# Patient Record
Sex: Female | Born: 1965
Health system: Southern US, Community
[De-identification: ages and names within clinical notes are randomized; demographics above are authoritative.]

## PROBLEM LIST (undated history)

## (undated) ENCOUNTER — Ambulatory Visit (HOSPITAL_COMMUNITY): Admission: EM | Discharge: 2021-06-09 | Discharge status: Absent without leave | Payer: Self-pay

## (undated) DIAGNOSIS — G4700 Insomnia, unspecified: Secondary | ICD-10-CM

## (undated) DIAGNOSIS — E8881 Metabolic syndrome: Secondary | ICD-10-CM

## (undated) DIAGNOSIS — R51 Headache: Secondary | ICD-10-CM

## (undated) DIAGNOSIS — R519 Headache, unspecified: Secondary | ICD-10-CM

## (undated) DIAGNOSIS — H15101 Unspecified episcleritis, right eye: Secondary | ICD-10-CM

## (undated) DIAGNOSIS — T8859XA Other complications of anesthesia, initial encounter: Secondary | ICD-10-CM

## (undated) DIAGNOSIS — F419 Anxiety disorder, unspecified: Secondary | ICD-10-CM

## (undated) DIAGNOSIS — R030 Elevated blood-pressure reading, without diagnosis of hypertension: Secondary | ICD-10-CM

## (undated) DIAGNOSIS — T4145XA Adverse effect of unspecified anesthetic, initial encounter: Secondary | ICD-10-CM

## (undated) DIAGNOSIS — K219 Gastro-esophageal reflux disease without esophagitis: Secondary | ICD-10-CM

## (undated) DIAGNOSIS — I10 Essential (primary) hypertension: Secondary | ICD-10-CM

## (undated) DIAGNOSIS — M1990 Unspecified osteoarthritis, unspecified site: Secondary | ICD-10-CM

## (undated) DIAGNOSIS — IMO0001 Reserved for inherently not codable concepts without codable children: Secondary | ICD-10-CM

## (undated) HISTORY — DX: Elevated blood-pressure reading, without diagnosis of hypertension: R03.0

## (undated) HISTORY — PX: DILATION AND CURETTAGE OF UTERUS: SHX78

## (undated) HISTORY — DX: Insomnia, unspecified: G47.00

## (undated) HISTORY — DX: Anxiety disorder, unspecified: F41.9

## (undated) HISTORY — DX: Metabolic syndrome: E88.81

## (undated) HISTORY — DX: Unspecified osteoarthritis, unspecified site: M19.90

## (undated) HISTORY — PX: FRACTURE SURGERY: SHX138

## (undated) HISTORY — DX: Gastro-esophageal reflux disease without esophagitis: K21.9

## (undated) HISTORY — DX: Metabolic syndrome: E88.810

## (undated) HISTORY — DX: Morbid (severe) obesity due to excess calories: E66.01

## (undated) HISTORY — PX: INTRAUTERINE DEVICE INSERTION: SHX323

## (undated) HISTORY — DX: Reserved for inherently not codable concepts without codable children: IMO0001

---

## 1998-02-24 ENCOUNTER — Other Ambulatory Visit: Admission: RE | Admit: 1998-02-24 | Discharge: 1998-02-24 | Payer: Self-pay | Admitting: Obstetrics & Gynecology

## 1998-06-25 ENCOUNTER — Inpatient Hospital Stay (HOSPITAL_COMMUNITY): Admission: AD | Admit: 1998-06-25 | Discharge: 1998-06-25 | Payer: Self-pay | Admitting: Obstetrics & Gynecology

## 1998-07-21 ENCOUNTER — Inpatient Hospital Stay (HOSPITAL_COMMUNITY): Admission: AD | Admit: 1998-07-21 | Discharge: 1998-07-23 | Payer: Self-pay | Admitting: Obstetrics and Gynecology

## 1998-09-03 ENCOUNTER — Other Ambulatory Visit: Admission: RE | Admit: 1998-09-03 | Discharge: 1998-09-03 | Payer: Self-pay | Admitting: Obstetrics & Gynecology

## 2003-01-01 ENCOUNTER — Ambulatory Visit (HOSPITAL_COMMUNITY): Admission: RE | Admit: 2003-01-01 | Discharge: 2003-01-01 | Payer: Self-pay | Admitting: Internal Medicine

## 2003-01-02 ENCOUNTER — Encounter (INDEPENDENT_AMBULATORY_CARE_PROVIDER_SITE_OTHER): Payer: Self-pay | Admitting: Internal Medicine

## 2003-01-02 ENCOUNTER — Ambulatory Visit (HOSPITAL_COMMUNITY): Admission: RE | Admit: 2003-01-02 | Discharge: 2003-01-02 | Payer: Self-pay | Admitting: Internal Medicine

## 2003-03-14 ENCOUNTER — Encounter (HOSPITAL_COMMUNITY): Admission: RE | Admit: 2003-03-14 | Discharge: 2003-04-13 | Payer: Self-pay | Admitting: Orthopedic Surgery

## 2009-08-15 ENCOUNTER — Emergency Department (HOSPITAL_COMMUNITY): Admission: EM | Admit: 2009-08-15 | Discharge: 2009-08-15 | Payer: Self-pay | Admitting: Emergency Medicine

## 2010-09-11 NOTE — Op Note (Signed)
   Sharon Owen, Sharon Owen                         ACCOUNT NO.:  [id number]   MEDICAL RECORD NO.:  [id number]                   PATIENT TYPE:  AMB   LOCATION:  DAY                                  FACILITY:  [facility]   PHYSICIAN:  Lionel December, M.D.                 DATE OF BIRTH:  1966/01/28   DATE OF PROCEDURE:  DATE OF DISCHARGE:                                 OPERATIVE REPORT   PROCEDURE:  Esophagogastroduodenoscopy.   ENDOSCOPIST:  Lionel December, M.D.   INDICATIONS:  Sharon Owen is a 45 year old African-American female with  intermittent epigastric pain and history of melena.  She is undergoing a  diagnostic study.  She has been on Nexium and recently Levbid but still has  these symptoms.  Recent H. pylori serology was negative.   Procedure and risks were reviewed with the patient and informed consent was  obtained.   PREOPERATIVE MEDICATIONS:  Cetacaine spray for oropharyngeal topical  anesthesia, Demerol 50 mg IV and Versed 10 mg IV in divided dose.   FINDINGS:  Procedure performed in endoscopy suite.  The patient's vital  signs and O2 saturation were monitored during the procedure and remained  stable.  The patient was placed in the left lateral recumbent position and  Olympus videoscope was passed via the oropharynx without any difficulty into  the esophagus.   ESOPHAGUS:  Mucosa of the esophagus was normal except distally she had a 5-  mm long erosion merging to the GE junction.  There was a 4-cm size sliding  hiatal hernia.   STOMACH:  It was empty and distended very well with insufflation.  The folds  of the proximal stomach were normal.  Examination of the mucosa at gastric  body, antrum, pyloric channel, as well as angularis and fundus were normal.  Hernia was easily seen on this view.   DUODENUM:  Examination of the bulb and second part of the duodenum was  normal.   Endoscope was withdrawn.  The patient tolerated the procedure well.   FINAL DIAGNOSIS:  Erosive  reflux esophagitis, otherwise normal EGD.    RECOMMENDATIONS:  1. Antireflux measures reenforced.  For now she will stay on Nexium and     Levbid.  She will return for small-bowel follow through.  2. Prescription given for Anusol HC cream to be applied to perianal area on     b.i.d. p.r.n. basis.                                               Lionel December, M.D.    NR/MEDQ  D:  01/01/2003  T:  01/01/2003  Job:  [id number]   cc:   Milus Mallick. Lodema Hong, M.D.  [address]  [place], [place] [address]  Fax: [phone]

## 2010-09-11 NOTE — Consult Note (Signed)
NAME:  Sharon, Owen                         ACCOUNT NO.:  [id number]   MEDICAL RECORD NO.:  [id number]                  PATIENT TYPE:   LOCATION:                                       FACILITY:   PHYSICIAN:  Lionel December, M.D.                 DATE OF BIRTH:  [birth year]   DATE OF CONSULTATION:  12/11/2002  DATE OF DISCHARGE:                                   CONSULTATION   PRESENTING COMPLAINT:  Epigastric pain and melena.   HISTORY OF PRESENT ILLNESS:  Sharon Owen is a 45 year old African-American  female who is referred through the courtesy of Dr. Syliva Overman for a GI  evaluation.  She presents with a two-year history of intermittent epigastric  pain triggered with fatty foods.  She also has had nausea but no vomiting.  She also complains of frequent heartburn, at least once a day and has had  occasional dysphagia with meats.  At times she has had no appetite and  unable to eat.  However, she still has gained 20 pounds this year.  She was  given Nexium but she does not take it every day.  She did have an ultrasound  within the last seven to eight months which was negative for cholelithiasis.  Lately she has had a few occasions where she had black or tarry stools.  She  does not take iron or Pepto-Bismol.  She also has noted a sense of  incomplete evacuation but has not had any rectal bleeding.  She was seen by  Dr. Lodema Hong a few weeks ago and had negative H. pylori serology.  TSH was  normal at 1.3 and her H&H was 12.9 and 39.8.  The patient is very concerned  because her maternal grandmother was treated for gastric carcinoma.  She is  presently on Zoloft 50 mg daily, Nexium 40 mg daily p.r.n.  She does not  take OTC NSAIDS.   PAST MEDICAL HISTORY:  Premenstrual tension for which she was on Zoloft  initially every two weeks but Dr. Lodema Hong suggested that she take it every  day.  She feels it also has helped her cope with stress.  She had a C-  section in 1989.   ALLERGIES:  CODEINE.   FAMILY HISTORY:  Both parents are hypertensive.  She has a sister who has  weight problems and mildly hypertensive.   SOCIAL HISTORY:  She is married.  She has three children.  She is an Astronomer.  She worked at [employer] for eight years but now she works at [employer] three days  each week.  She does not smoke cigarettes and drinks alcohol very  occasionally.   PHYSICAL EXAMINATION:  GENERAL:  Pleasant, mildly-obese African-American  female who is in no acute distress.  VITAL SIGNS:  She weighs 219 pounds, she is 5 feet 6 inches tall.  Pulse 76  per minute, blood pressure 124/70, temperature 98.3.  HEENT:  Conjunctivae are pink, sclerae not icteric.  Oropharyngeal mucosa is  normal.  NECK:  Without masses or thyromegaly.  HEART:  Lung examination within normal limits.  ABDOMEN:  Symmetrical.  Bowel sounds are normal.  Palpation reveals soft  abdomen with mild midepigastric tenderness.  No organomegaly or masses  noted.  RECTAL:  Exam is deferred as she had one by Dr. Lodema Hong recently revealing  guaiac negative stool.  EXTREMITIES:  No clubbing or edema noted.   IMPRESSION:  Sharon Owen is a 45 year old African-American female with a two-  year history of intermittent epigastric pain, frequent heartburn, who also  reports recent melena.  Her H&H recently was normal and H. pylori serology  is negative.  I suspect she could have irritable bowel syndrome or  dyspepsia, but given her history of melena, peptic ulcer disease needs to be  ruled out.   RECOMMENDATIONS:  1. She will continue antireflux measures and Nexium at 40 mg p.o. q.a.m.  I     asked that she take it every day at least for the next 8-12 weeks.  2. Levbid one tablet every morning; prescription given for #30 with two     refills.  3. Diagnostic esophagogastroduodenoscopy in the near future.  I have     received the procedure risks with the patient and she is agreeable.  4. I have also given her hemoccult and  she will bring a stool sample if she     has melena again.   I would like to thank Dr. Lodema Hong for the opportunity to participate in  Sharon Owen's care.                                               Lionel December, M.D.    NR/MEDQ  D:  12/11/2002  T:  12/11/2002  Job:  [id number]   cc:   Milus Mallick. Lodema Hong, M.D.  [address]  [place], [place] [address]  Fax: [phone]   [facility]

## 2011-07-15 ENCOUNTER — Ambulatory Visit (INDEPENDENT_AMBULATORY_CARE_PROVIDER_SITE_OTHER): Payer: BC Managed Care – PPO | Admitting: Orthopedic Surgery

## 2011-07-15 ENCOUNTER — Encounter: Payer: Self-pay | Admitting: Orthopedic Surgery

## 2011-07-15 VITALS — BP 116/66 | Ht 66.0 in | Wt 242.0 lb

## 2011-07-15 DIAGNOSIS — M62838 Other muscle spasm: Secondary | ICD-10-CM

## 2011-07-15 DIAGNOSIS — G5790 Unspecified mononeuropathy of unspecified lower limb: Secondary | ICD-10-CM

## 2011-07-15 MED ORDER — GABAPENTIN 100 MG PO CAPS: 100.0000 mg | ORAL_CAPSULE | Freq: Every day | ORAL | Status: DC

## 2011-07-15 MED ORDER — DICLOFENAC POTASSIUM 50 MG PO TABS: 50.0000 mg | ORAL_TABLET | Freq: Two times a day (BID) | ORAL | Status: DC

## 2011-07-15 MED ORDER — CYCLOBENZAPRINE HCL 10 MG PO TABS: 10.0000 mg | ORAL_TABLET | Freq: Three times a day (TID) | ORAL | Status: AC | PRN

## 2011-07-15 NOTE — Patient Instructions (Addendum)
Dx: Tendonitis patelalr tendon  Dx Mononeuritis   Start diclofenac 50 mg bid take with food   Start neurontin at night

## 2011-07-17 ENCOUNTER — Encounter: Payer: Self-pay | Admitting: Orthopedic Surgery

## 2011-07-17 DIAGNOSIS — G5790 Unspecified mononeuropathy of unspecified lower limb: Secondary | ICD-10-CM | POA: Insufficient documentation

## 2011-07-17 DIAGNOSIS — M62838 Other muscle spasm: Secondary | ICD-10-CM | POA: Insufficient documentation

## 2011-07-17 NOTE — Progress Notes (Signed)
  Subjective:    Sharon Owen is a 46 y.o. female who presents with knee pain involving the right knee. Onset was sudden, not related to any specific activity. Inciting event: none known. Current symptoms include: crepitus sensation, giving out, pain located posterolateral right knee/leg and anterior portion right knee, stiffness and swelling. Pain is aggravated by going up and down stairs and at night . Patient has had no prior knee problems. Evaluation to date: none. Treatment to date: none.  The following portions of the patient's history were reviewed and updated as appropriate: allergies, current medications, past family history, past medical history, past social history, past surgical history and problem list.   Review of Systems Pertinent items are noted in HPI. and in patient questionaire, scanned docs    Objective:    BP 116/66  Ht 5\' 6"  (1.676 m)  Wt 109.77 kg (242 lb)  BMI 39.06 kg/m2 Right knee: Range of motion right knee is normal. There is no joint effusion. There is tenderness over the patellar tendon. Is also tenderness around the right fibula and posterior lateral joint line including the anterior compartment of the right lower  extremity. The ligamentous structures are normal. The joint lines are nontender. Muscle tone is normal. Meniscal signs are negative. Straight leg raise is normal as well  Left knee:  normal and no effusion, full active range of motion, no joint line tenderness, ligamentous structures intact.   X-ray right knee: no fracture, dislocation, swelling or degenerative changes noted    Assessment:    Right Moderate patellar tendonitis on the right    Plan:    Natural history and expected course discussed. Questions answered. Reduction in offending activity. Quad strengthening exercises. NSAIDs per medication orders. OTC analgesics as needed.    Separately identifiable x-ray report [facility] and lateral with oblique film of the right knee  Findings  normal joint lines normal joint spaces normal bone quality normal alignment  Impression normal right knee x-ray

## 2011-07-26 LAB — HM MAMMOGRAPHY: HM Mammogram: NORMAL

## 2011-10-10 ENCOUNTER — Other Ambulatory Visit: Payer: Self-pay | Admitting: Orthopedic Surgery

## 2011-10-26 ENCOUNTER — Other Ambulatory Visit: Payer: Self-pay | Admitting: Orthopedic Surgery

## 2011-10-27 ENCOUNTER — Other Ambulatory Visit: Payer: Self-pay | Admitting: *Deleted

## 2011-10-27 ENCOUNTER — Other Ambulatory Visit: Payer: Self-pay | Admitting: Orthopedic Surgery

## 2011-10-27 MED ORDER — DICLOFENAC POTASSIUM 50 MG PO TABS: 50.0000 mg | ORAL_TABLET | Freq: Two times a day (BID) | ORAL | Status: DC

## 2011-11-03 ENCOUNTER — Telehealth: Payer: Self-pay | Admitting: Orthopedic Surgery

## 2011-11-03 NOTE — Telephone Encounter (Signed)
Patient called to relay that "there is still something really wrong with her right knee," for which she was seen 07/15/11.  She is asking if she can have an MRI.  She had no previous treatment, and was to return if symptoms worsen.  Wishes to have MRI Vs. Coming back for re-exam at this time, if possible.  I relayed that insurance companies typically have a certain protocol before they approve MRI, which may include physical therapy and other types of treatment. States she does not wish to try physical therapy.  Please advise.   Her work ph# at [facility] [employer] (hours 8:00am to 4:30pm is [phone], ext [phone],  or celll [phone].

## 2011-11-04 ENCOUNTER — Encounter: Payer: Self-pay | Admitting: Orthopedic Surgery

## 2011-11-04 NOTE — Telephone Encounter (Signed)
Yes we will order mri  Pending insurance precert

## 2011-11-04 NOTE — Progress Notes (Signed)
Patient ID: Sharon Owen, female   DOB: 01/11/1966, 46 y.o.   MRN: [id number] Chief Complaint  Patient presents with  . Knee Pain    Increasing RIGHT knee pain    The patient had anterior knee pain and some RIGHT lateral knee pain and was treated for patella tendinitis. Anterior knee pain and possible mononeuritis. She had Neurontin anti-inflammatories, exercise program for her greater than 6 weeks. Did not improve call back to the office complaining of pain requesting an MRI of the RIGHT knee. Same something didn't feel RIGHT.  So we will proceed with ordering an MRI of the RIGHT knee pending approval from the insurer

## 2011-11-04 NOTE — Telephone Encounter (Signed)
Spoke with patient and relayed per Dr. Mort Sawyers note.   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

## 2011-11-08 ENCOUNTER — Other Ambulatory Visit: Payer: Self-pay | Admitting: *Deleted

## 2011-11-08 DIAGNOSIS — M25561 Pain in right knee: Secondary | ICD-10-CM

## 2011-11-12 ENCOUNTER — Telehealth: Payer: Self-pay | Admitting: Radiology

## 2011-11-12 NOTE — Telephone Encounter (Signed)
I called and left a message with the patient's MRI appointment at [name] on 11-16-11 at 4:45. Patient has BCBS, no precert is needed per [name]. Patient will follow up with [place] and is aware to bring her films.

## 2011-11-16 ENCOUNTER — Telehealth: Payer: Self-pay | Admitting: Orthopedic Surgery

## 2011-11-16 NOTE — Telephone Encounter (Signed)
Received MRI report in fax, from [facility].  Left her a voice message at cell #[phone] to let her know we have received report.  Patient has appointment scheduled 12/06/11 for results. Patient to bring film. Copy of report in file and copy sent for scanning.

## 2011-11-24 ENCOUNTER — Encounter: Payer: Self-pay | Admitting: Orthopedic Surgery

## 2011-12-01 ENCOUNTER — Encounter: Payer: Self-pay | Admitting: Orthopedic Surgery

## 2011-12-01 ENCOUNTER — Ambulatory Visit (INDEPENDENT_AMBULATORY_CARE_PROVIDER_SITE_OTHER): Payer: BC Managed Care – PPO | Admitting: Orthopedic Surgery

## 2011-12-01 VITALS — BP 100/70 | Ht 66.0 in | Wt 243.0 lb

## 2011-12-01 DIAGNOSIS — M23329 Other meniscus derangements, posterior horn of medial meniscus, unspecified knee: Secondary | ICD-10-CM

## 2011-12-01 DIAGNOSIS — M25569 Pain in unspecified knee: Secondary | ICD-10-CM

## 2011-12-01 MED ORDER — HYDROMORPHONE HCL 2 MG PO TABS: 2.0000 mg | ORAL_TABLET | Freq: Two times a day (BID) | ORAL | Status: DC | PRN

## 2011-12-01 NOTE — Patient Instructions (Addendum)
Arthroscopic Procedure, Knee An arthroscopic procedure can find what is wrong with your knee. PROCEDURE Arthroscopy is a surgical technique that allows your orthopedic surgeon to diagnose and treat your knee injury with accuracy. They will look into your knee through a small instrument. This is almost like a small (pencil sized) telescope. Because arthroscopy affects your knee less than open knee surgery, you can anticipate a more rapid recovery. Taking an active role by following your caregiver's instructions will help with rapid and complete recovery. Use crutches, rest, elevation, ice, and knee exercises as instructed. The length of recovery depends on various factors including type of injury, age, physical condition, medical conditions, and your rehabilitation. Your knee is the joint between the large bones (femur and tibia) in your leg. Cartilage covers these bone ends which are smooth and slippery and allow your knee to bend and move smoothly. Two menisci, thick, semi-lunar shaped pads of cartilage which form a rim inside the joint, help absorb shock and stabilize your knee. Ligaments bind the bones together and support your knee joint. Muscles move the joint, help support your knee, and take stress off the joint itself. Because of this all programs and physical therapy to rehabilitate an injured or repaired knee require rebuilding and strengthening your muscles. AFTER THE PROCEDURE  After the procedure, you will be moved to a recovery area until most of the effects of the medication have worn off. Your caregiver will discuss the test results with you.   Only take over-the-counter or prescription medicines for pain, discomfort, or fever as directed by your caregiver.    You have been scheduled for arthroscocpic knee surgery.  All surgeries carry some risk.  Remember you always have the option of continued nonsurgical treatment. However in this situation the risks vs. the benefits favor surgery as  the best treatment option. The risks of the surgery includes the following but is not limited to bleeding, infection, pulmonary embolus, death from anesthesia, nerve injury vascular injury or need for further surgery, continued pain.  Specific to this procedure the following risks and complications are rare but possible Stiffness, pain, weakness, giving out  I expect  recovery will be in 3-4 weeks some patients take 6 weeks.  You  will need physical therapy after the procedure  Stop any blood thinning medication: such as warfarin, coumadin, naprosyn, ibuprofen, advil, diclofenac, aspirin   Meniscus Injury of the Knee, Arthroscopy You may have an internal derangement of the knee. This means something is wrong inside the knee. Your caregiver can make a more accurate diagnosis (learning what is wrong) by performing an arthroscopic procedure. Your knee has two layers of cartilage. Articular cartilage covers the bone ends. It lets your knee bend and move smoothly. Two menisci (thick pads of cartilage that form a rim inside the joint) help absorb shock. They stabilize your knee. Ligaments bind the bones together. They support your knee joint. Muscles move the joint, help support your knee, and take stress off the joint itself.   ABOUT THE PROCEDURE Arthroscopy is a surgical technique. It allows your orthopedic surgeon to diagnose and treat your knee injury with accuracy. The surgeon looks into your knee through a small scope. The scope is like a small (pencil-sized) telescope. Arthroscopy is less invasive than open knee surgery. You can expect a more rapid recovery. Following your caregiver's instructions will help you recover rapidly and completely. Use crutches, rest, elevate, ice, and do knee exercises as instructed. The length of recovery depends on various  factors. These factors include type of injury, age, physical condition, medical conditions, and your determination. How long you will be away from your  normal activities will depend on what kind of knee problem you have. It will also depend on how much tissue is damaged. Rebuilding your muscles after arthroscopy helps ensure a full recovery. RECOVERY Recovery after a meniscus injury depends on how much meniscus is damaged. It also depends on whether or not you have damaged other knee tissue. With small tears, your recovery may take a couple weeks. Larger tears will take longer. Meniscus injuries can usually be treated during arthroscopy. If your injury is on the inner edge of the meniscus, your surgeon may trim the meniscus back to a smooth rim. In other cases, your surgeon will try to repair a damaged meniscus with sutures (stitches). This may lengthen your rehabilitation. It may provide better long-term health by helping your knee retain its shock absorption abilities. Use crutches, limit weight bearing, rest, elevate, apply ice, and exercise your knee as instructed. If a brace is applied, use as directed. The length of recovery depends on various factors including type of injury, age, physical condition, other medical conditions, and your determination. Your caregiver will help with instructions for rehabilitation of your knee. HOME CARE INSTRUCTIONS  Use crutches and knee exercises as instructed.   Applying an ice pack to your operative site may help with discomfort. It may also keep the swelling down.   Only take over-the-counter or prescription medicines for pain, discomfort, inflammation (soreness)or fever as directed by your caregiver. You may use these only if your caregiver has not given medications that would interfere.   You may resume normal diet and activities as directed.  SEEK MEDICAL ATTENTION IF:  There is increased bleeding (more than a small spot) from the wound.   You notice redness, swelling, or increasing pain in the wound.   Pus is coming from wound.   An unexplained oral temperature above 102 F (38.9 C) develops, or  as your caregiver suggests.   You notice a foul smell coming from the wound or dressing.  SEEK IMMEDIATE MEDICAL CARE IF:  You develop a rash.   You have difficulty breathing.   You have any allergic problems.  Document Released: 04/09/2000 Document Revised: 04/01/2011 Document Reviewed: 06/26/2007 [facility] Patient Information 2012 [place], [place].

## 2011-12-01 NOTE — Progress Notes (Signed)
  Subjective:    Patient ID: Sharon Owen, female    DOB: May 04, 1965, 46 y.o.   MRN: [id number] Chief Complaint  Patient presents with  . Results    MRI review right knee   Medial knee pain  HPI  Current Outpatient Prescriptions on File Prior to Visit  Medication Sig Dispense Refill  . cyclobenzaprine (FLEXERIL) 10 MG tablet Take 1 tablet (10 mg total) by mouth every 8 (eight) hours as needed for muscle spasms.  60 tablet  5  . diclofenac (CATAFLAM) 50 MG tablet TAKE 1 TABLET BY MOUTH TWICE DAILY  60 tablet  1  . gabapentin (NEURONTIN) 100 MG capsule TAKE ONE CAPSULE BY MOUTH AT BEDTIME *MAY INCREASE UP TO 3 CAPSULES AS DIRECTED  60 capsule  1  No past surgical history on file.  No past medical history on file.  History   Social History Narrative  . No narrative on file   History   Social History  . Marital Status: Single    Spouse Name: N/A    Number of Children: N/A  . Years of Education: N/A   Occupational History  . Not on file.   Social History Main Topics  . Smoking status: Never Smoker   . Smokeless tobacco: Not on file  . Alcohol Use: Not on file  . Drug Use: Not on file  . Sexually Active: Not on file   Other Topics Concern  . Not on file   Social History Narrative  . No narrative on file    Review of Systems Left knee pain otherwise normal review of systems    Objective:   Physical Exam  Vital signs BP 100/70  Ht 5\' 6"  (1.676 m)  Wt 243 lb (110.224 kg)  BMI 39.22 kg/m2  General appearance is normal  The patient is alert and oriented x3  The patient's mood and affect are normal  Gait assessment: mild antalgic gait right side favored right knee   cardiovascular exam reveals normal pulses and temperature without edema swelling.  The lymphatic system is negative for palpable lymph nodes  The sensory exam is normal.  There are no pathologic reflexes.  Balance is normal.  The right knee is swollen cannot fully extend cannot fully  flex range of motion arc is 120. The knee is stable. Strength is normal muscle tone is normal. She has a positive McMurray sign skin is intact  Upper extremity exam  Inspection and palpation revealed no abnormalities in the upper extremities.  Range of motion is full without contracture.  Motor exam is normal with grade 5 strength.  The joints are fully reduced without subluxation.  There is no atrophy or tremor and muscle tone is normal.  All joints are stable.  Left knee normal MRI was reviewed with the report. I agree that a torn medial meniscus the anterior cruciate ligament mucoid degeneration does not appear to be a tear clinically not indicating a tear  Recommend arthroscopy of the right knee were removed the torn meniscal fragment  Expect for a six-week period of recovery.  I discussed the risks and benefits and postoperative plan with patient and she agrees to have surgery understanding those risks and benefits       Assessment & Plan:

## 2011-12-06 ENCOUNTER — Ambulatory Visit: Payer: BC Managed Care – PPO | Admitting: Orthopedic Surgery

## 2011-12-08 ENCOUNTER — Encounter (HOSPITAL_COMMUNITY): Payer: Self-pay | Admitting: Pharmacy Technician

## 2011-12-13 ENCOUNTER — Encounter (HOSPITAL_COMMUNITY): Payer: BC Managed Care – PPO

## 2011-12-13 ENCOUNTER — Other Ambulatory Visit (HOSPITAL_COMMUNITY): Payer: BC Managed Care – PPO

## 2011-12-16 NOTE — Patient Instructions (Addendum)
[name]  12/16/2011   Your procedure is scheduled on:  12/24/11  Report to [facility] at [id number].  Call this number if you have problems the morning of surgery: [phone]   Remember:   Do not eat food:After Midnight.  May have clear liquids:until Midnight .  Clear liquids include soda, tea, black coffee, apple or grape juice, broth.  Take these medicines the morning of surgery with A SIP OF WATER: pain pill, neurontin   Do not wear jewelry, make-up or nail polish.  Do not wear lotions, powders, or perfumes. You may wear deodorant.  Do not shave 48 hours prior to surgery. Men may shave face and neck.  Do not bring valuables to the hospital.  Contacts, dentures or bridgework may not be worn into surgery.  Leave suitcase in the car. After surgery it may be brought to your room.  For patients admitted to the hospital, checkout time is 11:00 AM the day of discharge.   Patients discharged the day of surgery will not be allowed to drive home.  Name and phone number of your driver: family  Special Instructions: CHG Shower Use Special Wash: 1/2 bottle night before surgery and 1/2 bottle morning of surgery.   Please read over the following fact sheets that you were given: Pain Booklet, MRSA Information, Surgical Site Infection Prevention, Anesthesia Post-op Instructions and Care and Recovery After Surgery  Arthroscopic Procedure, Knee An arthroscopic procedure can find what is wrong with your knee. PROCEDURE Arthroscopy is a surgical technique that allows your orthopedic surgeon to diagnose and treat your knee injury with accuracy. They will look into your knee through a small instrument. This is almost like a small (pencil sized) telescope. Because arthroscopy affects your knee less than open knee surgery, you can anticipate a more rapid recovery. Taking an active role by following your caregiver's instructions will help with rapid and complete recovery. Use crutches, rest, elevation,  ice, and knee exercises as instructed. The length of recovery depends on various factors including type of injury, age, physical condition, medical conditions, and your rehabilitation. Your knee is the joint between the large bones (femur and tibia) in your leg. Cartilage covers these bone ends which are smooth and slippery and allow your knee to bend and move smoothly. Two menisci, thick, semi-lunar shaped pads of cartilage which form a rim inside the joint, help absorb shock and stabilize your knee. Ligaments bind the bones together and support your knee joint. Muscles move the joint, help support your knee, and take stress off the joint itself. Because of this all programs and physical therapy to rehabilitate an injured or repaired knee require rebuilding and strengthening your muscles. AFTER THE PROCEDURE  After the procedure, you will be moved to a recovery area until most of the effects of the medication have worn off. Your caregiver will discuss the test results with you.   Only take over-the-counter or prescription medicines for pain, discomfort, or fever as directed by your caregiver.  SEEK MEDICAL CARE IF:   You have increased bleeding from your wounds.   You see redness, swelling, or have increasing pain in your wounds.   You have pus coming from your wound.   You have an oral temperature above 102 F (38.9 C).   You notice a bad smell coming from the wound or dressing.   You have severe pain with any motion of your knee.  SEEK IMMEDIATE MEDICAL CARE IF:   You develop a rash.  You have difficulty breathing.   You have any allergic problems.  Document Released: 04/09/2000 Document Revised: 04/01/2011 Document Reviewed: 11/01/2007 [facility] Patient Information 2012 [place], [place].PATIENT INSTRUCTIONS POST-ANESTHESIA  IMMEDIATELY FOLLOWING SURGERY:  Do not drive or operate machinery for the first twenty four hours after surgery.  Do not make any important decisions for twenty  four hours after surgery or while taking narcotic pain medications or sedatives.  If you develop intractable nausea and vomiting or a severe headache please notify your doctor immediately.  FOLLOW-UP:  Please make an appointment with your surgeon as instructed. You do not need to follow up with anesthesia unless specifically instructed to do so.  WOUND CARE INSTRUCTIONS (if applicable):  Keep a dry clean dressing on the anesthesia/puncture wound site if there is drainage.  Once the wound has quit draining you may leave it open to air.  Generally you should leave the bandage intact for twenty four hours unless there is drainage.  If the epidural site drains for more than 36-48 hours please call the anesthesia department.  QUESTIONS?:  Please feel free to call your physician or the hospital operator if you have any questions, and they will be happy to assist you.       Incentive Spirometer An incentive spirometer is a tool that can help keep your lungs clear and active. This tool measures how well you are filling your lungs with each breath. Taking long deep breaths may help reverse or decrease the chance of developing breathing (pulmonary) problems (especially infection) following:  Surgery of the chest or abdomen.   Surgery if you have a history of smoking or a lung problem.   A long period of time when you are unable to move or be active.  BEFORE THE PROCEDURE   If the spirometer includes an indictor to show your best effort, your nurse or respiratory therapist will set it to a desired goal.   If possible, sit up straight or lean slightly forward. Try not to slouch.   Hold the incentive spirometer in an upright position.  INSTRUCTIONS FOR USE  1. Sit on the edge of your bed if possible, or sit up as far as you can in bed or on a chair.  2. Hold the incentive spirometer in an upright position.  3. Breathe out normally.  4. Place the mouthpiece in your mouth and seal your lips tightly  around it.  5. Breathe in slowly and as deeply as possible, raising the piston or the ball toward the top of the column.  6. Hold your breath for 3-5 seconds or for as long as possible. Allow the piston or ball to fall to the bottom of the column.  7. Remove the mouthpiece from your mouth and breathe out normally.  8. Rest for a few seconds and repeat Steps 1 through 7 at least 10 times every 1-2 hours when you are awake. Take your time and take a few normal breaths between deep breaths.  9. The spirometer may include an indicator to show your best effort. Use the indicator as a goal to work toward during each repetition.  10. After each set of 10 deep breaths, practice coughing to be sure your lungs are clear. If you have an incision (the cut made at the time of surgery), support your incision when coughing by placing a pillow or rolled up towels firmly against it.  Once you are able to get out of bed, walk around indoors and cough well. You  may stop using the incentive spirometer when instructed by your caregiver.  RISKS AND COMPLICATIONS  Breathing too quickly may cause dizziness. At an extreme, this could cause you to pass out. Take your time so you do not get dizzy or light-headed.   If you are in pain, you may need to take or ask for pain medication before doing incentive spirometry. It is harder to take a deep breath if you are having pain.  AFTER USE  Rest and breathe slowly and easily.   It can be helpful to keep track of a log of your progress. Your caregiver can provide you with a simple table to help with this.  If you are using the spirometer at home, follow these instructions: SEEK MEDICAL CARE IF:   You are having difficultly using the spirometer.   You have trouble using the spirometer as often as instructed.   Your pain medication is not giving enough relief while using the spirometer.   You develop fever of 100.5 F (38.1 C) or higher.  SEEK IMMEDIATE MEDICAL CARE IF:     You cough up bloody sputum that had not been present before.   You develop fever of 102 F (38.9 C) or greater.   You develop worsening pain at or near the incision site.  MAKE SURE YOU:   Understand these instructions.   Will watch your condition.   Will get help right away if you are not doing well or get worse.  Document Released: 08/23/2006 Document Revised: 04/01/2011 Document Reviewed: 10/24/2006 [facility] Patient Information 2012 [place], [place].

## 2011-12-17 ENCOUNTER — Telehealth: Payer: Self-pay | Admitting: Orthopedic Surgery

## 2011-12-17 ENCOUNTER — Encounter (HOSPITAL_COMMUNITY)
Admission: RE | Admit: 2011-12-17 | Discharge: 2011-12-17 | Disposition: A | Discharge status: Home / Self Care | Payer: BC Managed Care – PPO | Source: Ambulatory Visit | Attending: Orthopedic Surgery | Admitting: Orthopedic Surgery

## 2011-12-17 ENCOUNTER — Encounter (HOSPITAL_COMMUNITY): Payer: Self-pay

## 2011-12-17 LAB — BASIC METABOLIC PANEL
BUN: 10 mg/dL (ref 6–23)
CO2: 28 mEq/L (ref 19–32)
Calcium: 9.1 mg/dL (ref 8.4–10.5)
Chloride: 103 mEq/L (ref 96–112)
Creatinine, Ser: 0.78 mg/dL (ref 0.50–1.10)
GFR calc Af Amer: >90 mL/min (ref >90)
GFR calc non Af Amer: >90 mL/min (ref >90)
Glucose, Bld: 100 mg/dL — ABNORMAL HIGH (ref 70–99)
Potassium: 4.4 mEq/L (ref 3.5–5.1)
Sodium: 138 mEq/L (ref 135–145)

## 2011-12-17 LAB — SURGICAL PCR SCREEN
MRSA, PCR: NEGATIVE
Staphylococcus aureus: POSITIVE — AB

## 2011-12-17 LAB — HEMOGLOBIN AND HEMATOCRIT, BLOOD
HCT: 38.4 % (ref 36.0–46.0)
Hemoglobin: 12.7 g/dL (ref 12.0–15.0)

## 2011-12-17 NOTE — Telephone Encounter (Signed)
Contacted insurer [employer] [place], direct ph# [phone] regarding out-patient surgery scheduled 12/24/11 at [facility], CPT [phone], [phone], no pre-authorization required, per Crist Fat, reference [id number].

## 2011-12-23 NOTE — H&P (Signed)
Sharon Owen is an 46 y.o. female.   Chief Complaint: Right knee pain HPI: This is a 46 year old female who had pain in her right knee was treated conservatively did not get better eventually had an MRI which shows she has degenerative arthritis of her knee with a torn medial meniscus. She would like to proceed further with surgical treatment to give her the best chance of recovering nearly normal knee function. She understands risk and benefits of the procedure and the postoperative course.  No past medical history on file.  Past Surgical History  Procedure Date  . No past surgeries     No family history on file. Social History:  reports that she has never smoked. She does not have any smokeless tobacco history on file. She reports that she drinks alcohol. She reports that she does not use illicit drugs.  Allergies:  Allergies  Allergen Reactions  . Adhesive (Tape) Itching  . Hydrocodone Itching  . Latex Itching    No prescriptions prior to admission    No results found for this or any previous visit (from the past 48 hour(s)). No results found.  ROS Complete system review was unremarkable except for the musculoskeletal findings of pain and swelling giving way.  There were no vitals taken for this visit. Physical Exam  Musculoskeletal:       Vital signs are stable as recorded  General appearance is normal, obesity  The patient is alert and oriented x3  The patient's mood and affect are normal  Gait assessment: Abnormal with favoring the right leg The cardiovascular exam reveals normal pulses and temperature without edema swelling.  The lymphatic system is negative for palpable lymph nodes  The sensory exam is normal.  There are no pathologic reflexes.  Balance is normal.   Exam of the right knee Inspection medial joint line tenderness small joint effusion Range of motion flexion ARC 110 pain Stability normal Strength normal Skin normal Positive  McMurray sign  Upper extremities inspection normal, full range of motion, no instability detected and strength was normal.  Left knee no deformity, no contracture. Stability test normal. Strength normal.     MRI showed torn medial meniscus and osteoarthritis Assessment/Plan Torn medial meniscus right knee, osteoarthritis right knee  Arthroscopy right knee partial medial meniscectomy plus or minus debridement as needed  Fuller Canada 12/23/2011, 2:06 PM

## 2011-12-24 ENCOUNTER — Encounter (HOSPITAL_COMMUNITY): Payer: Self-pay | Admitting: Anesthesiology

## 2011-12-24 ENCOUNTER — Encounter (HOSPITAL_COMMUNITY)
Admission: RE | Disposition: A | Discharge status: Home / Self Care | Payer: Self-pay | Source: Ambulatory Visit | Attending: Orthopedic Surgery

## 2011-12-24 ENCOUNTER — Ambulatory Visit (HOSPITAL_COMMUNITY): Payer: BC Managed Care – PPO | Admitting: Anesthesiology

## 2011-12-24 ENCOUNTER — Ambulatory Visit (HOSPITAL_COMMUNITY)
Admission: RE | Admit: 2011-12-24 | Discharge: 2011-12-24 | Disposition: A | Discharge status: Home / Self Care | Payer: BC Managed Care – PPO | Source: Ambulatory Visit | Attending: Orthopedic Surgery | Admitting: Orthopedic Surgery

## 2011-12-24 ENCOUNTER — Encounter (HOSPITAL_COMMUNITY): Payer: Self-pay | Admitting: *Deleted

## 2011-12-24 DIAGNOSIS — M1710 Unilateral primary osteoarthritis, unspecified knee: Secondary | ICD-10-CM

## 2011-12-24 DIAGNOSIS — M23329 Other meniscus derangements, posterior horn of medial meniscus, unspecified knee: Secondary | ICD-10-CM

## 2011-12-24 DIAGNOSIS — Z01812 Encounter for preprocedural laboratory examination: Secondary | ICD-10-CM | POA: Insufficient documentation

## 2011-12-24 DIAGNOSIS — Z9889 Other specified postprocedural states: Secondary | ICD-10-CM

## 2011-12-24 DIAGNOSIS — IMO0002 Reserved for concepts with insufficient information to code with codable children: Secondary | ICD-10-CM | POA: Insufficient documentation

## 2011-12-24 HISTORY — PX: CHONDROPLASTY: SHX5177

## 2011-12-24 SURGERY — CHONDROPLASTY
Anesthesia: General | Site: Knee | Laterality: Right | Wound class: Clean

## 2011-12-24 MED ORDER — FENTANYL CITRATE 0.05 MG/ML IJ SOLN
INTRAMUSCULAR | Status: AC
  Filled 2011-12-24: qty 5

## 2011-12-24 MED ORDER — MIDAZOLAM HCL 2 MG/2ML IJ SOLN
1.0000 mg | INTRAMUSCULAR | Status: DC | PRN
  Administered 2011-12-24: 2 mg via INTRAVENOUS

## 2011-12-24 MED ORDER — MIDAZOLAM HCL 5 MG/5ML IJ SOLN
INTRAMUSCULAR | Status: DC | PRN
  Administered 2011-12-24: 2 mg via INTRAVENOUS

## 2011-12-24 MED ORDER — MIDAZOLAM HCL 2 MG/2ML IJ SOLN
INTRAMUSCULAR | Status: AC
  Filled 2011-12-24: qty 2

## 2011-12-24 MED ORDER — ROCURONIUM BROMIDE 100 MG/10ML IV SOLN
INTRAVENOUS | Status: DC | PRN
  Administered 2011-12-24: 40 mg via INTRAVENOUS

## 2011-12-24 MED ORDER — CELECOXIB 100 MG PO CAPS
400.0000 mg | ORAL_CAPSULE | Freq: Once | ORAL | Status: AC
  Administered 2011-12-24: 400 mg via ORAL

## 2011-12-24 MED ORDER — PROPOFOL 10 MG/ML IV BOLUS
INTRAVENOUS | Status: DC | PRN
  Administered 2011-12-24: 170 mg via INTRAVENOUS

## 2011-12-24 MED ORDER — CHLORHEXIDINE GLUCONATE 4 % EX LIQD: 60.0000 mL | Freq: Once | CUTANEOUS | Status: DC

## 2011-12-24 MED ORDER — PROMETHAZINE HCL 12.5 MG PO TABS: 12.5000 mg | ORAL_TABLET | Freq: Four times a day (QID) | ORAL | Status: DC | PRN

## 2011-12-24 MED ORDER — SODIUM CHLORIDE 0.9 % IR SOLN
Status: DC | PRN
  Administered 2011-12-24: 1000 mL

## 2011-12-24 MED ORDER — ONDANSETRON HCL 4 MG/2ML IJ SOLN: 4.0000 mg | Freq: Once | INTRAMUSCULAR | Status: DC

## 2011-12-24 MED ORDER — GLYCOPYRROLATE 0.2 MG/ML IJ SOLN
INTRAMUSCULAR | Status: DC | PRN
  Administered 2011-12-24: 0.4 mg via INTRAVENOUS

## 2011-12-24 MED ORDER — TRAMADOL HCL 50 MG PO TABS
50.0000 mg | ORAL_TABLET | Freq: Once | ORAL | Status: AC
  Administered 2011-12-24: 50 mg via ORAL

## 2011-12-24 MED ORDER — GLYCOPYRROLATE 0.2 MG/ML IJ SOLN
INTRAMUSCULAR | Status: AC
  Filled 2011-12-24: qty 2

## 2011-12-24 MED ORDER — ONDANSETRON HCL 4 MG/2ML IJ SOLN
INTRAMUSCULAR | Status: AC
  Filled 2011-12-24: qty 2

## 2011-12-24 MED ORDER — CELECOXIB 100 MG PO CAPS
ORAL_CAPSULE | ORAL | Status: AC
  Filled 2011-12-24: qty 4

## 2011-12-24 MED ORDER — ONDANSETRON HCL 4 MG/2ML IJ SOLN
INTRAMUSCULAR | Status: DC | PRN
  Administered 2011-12-24: 4 mg via INTRAVENOUS

## 2011-12-24 MED ORDER — FENTANYL CITRATE 0.05 MG/ML IJ SOLN
25.0000 ug | INTRAMUSCULAR | Status: DC | PRN
  Administered 2011-12-24 (×4): 50 ug via INTRAVENOUS

## 2011-12-24 MED ORDER — KETOROLAC TROMETHAMINE 30 MG/ML IJ SOLN
INTRAMUSCULAR | Status: AC
  Filled 2011-12-24: qty 1

## 2011-12-24 MED ORDER — ENOXAPARIN SODIUM 30 MG/0.3ML ~~LOC~~ SOLN: 30.0000 mg | Freq: Once | SUBCUTANEOUS | Status: DC

## 2011-12-24 MED ORDER — PROMETHAZINE HCL 25 MG/ML IJ SOLN
6.2500 mg | INTRAMUSCULAR | Status: DC | PRN
  Administered 2011-12-24: 6.25 mg via INTRAVENOUS

## 2011-12-24 MED ORDER — PROMETHAZINE HCL 25 MG/ML IJ SOLN
INTRAMUSCULAR | Status: AC
  Filled 2011-12-24: qty 1

## 2011-12-24 MED ORDER — DEXAMETHASONE SODIUM PHOSPHATE 4 MG/ML IJ SOLN
INTRAMUSCULAR | Status: AC
  Filled 2011-12-24: qty 1

## 2011-12-24 MED ORDER — LACTATED RINGERS IV SOLN
INTRAVENOUS | Status: DC
  Administered 2011-12-24: 07:00:00 via INTRAVENOUS

## 2011-12-24 MED ORDER — ONDANSETRON HCL 4 MG/2ML IJ SOLN: 4.0000 mg | Freq: Once | INTRAMUSCULAR | Status: DC | PRN

## 2011-12-24 MED ORDER — LIDOCAINE HCL 1 % IJ SOLN
INTRAMUSCULAR | Status: DC | PRN
  Administered 2011-12-24: 50 mg via INTRADERMAL

## 2011-12-24 MED ORDER — EPINEPHRINE HCL 1 MG/ML IJ SOLN
INTRAMUSCULAR | Status: AC
  Filled 2011-12-24: qty 3

## 2011-12-24 MED ORDER — FENTANYL CITRATE 0.05 MG/ML IJ SOLN
INTRAMUSCULAR | Status: AC
  Filled 2011-12-24: qty 2

## 2011-12-24 MED ORDER — TRAMADOL HCL 50 MG PO TABS
ORAL_TABLET | ORAL | Status: AC
  Filled 2011-12-24: qty 1

## 2011-12-24 MED ORDER — BUPIVACAINE-EPINEPHRINE PF 0.5-1:200000 % IJ SOLN
INTRAMUSCULAR | Status: AC
  Filled 2011-12-24: qty 20

## 2011-12-24 MED ORDER — CEFAZOLIN SODIUM-DEXTROSE 2-3 GM-% IV SOLR: 2.0000 g | INTRAVENOUS | Status: DC

## 2011-12-24 MED ORDER — NEOSTIGMINE METHYLSULFATE 1 MG/ML IJ SOLN
INTRAMUSCULAR | Status: DC | PRN
  Administered 2011-12-24: 3 mg via INTRAVENOUS

## 2011-12-24 MED ORDER — CEFAZOLIN SODIUM-DEXTROSE 2-3 GM-% IV SOLR
INTRAVENOUS | Status: DC | PRN
  Administered 2011-12-24: 2 g via INTRAVENOUS

## 2011-12-24 MED ORDER — SODIUM CHLORIDE 0.9 % IR SOLN
Status: DC | PRN
  Administered 2011-12-24 (×4)

## 2011-12-24 MED ORDER — TRAMADOL-ACETAMINOPHEN 37.5-325 MG PO TABS: 1.0000 | ORAL_TABLET | ORAL | Status: AC | PRN

## 2011-12-24 MED ORDER — ONDANSETRON HCL 4 MG/2ML IJ SOLN
4.0000 mg | Freq: Once | INTRAMUSCULAR | Status: AC
  Administered 2011-12-24: 4 mg via INTRAVENOUS

## 2011-12-24 MED ORDER — DIPHENHYDRAMINE HCL 25 MG PO CAPS: 25.0000 mg | ORAL_CAPSULE | Freq: Four times a day (QID) | ORAL | Status: DC | PRN

## 2011-12-24 MED ORDER — KETOROLAC TROMETHAMINE 30 MG/ML IJ SOLN
30.0000 mg | Freq: Once | INTRAMUSCULAR | Status: AC
  Administered 2011-12-24: 30 mg via INTRAVENOUS

## 2011-12-24 MED ORDER — DEXAMETHASONE SODIUM PHOSPHATE 4 MG/ML IJ SOLN
4.0000 mg | Freq: Once | INTRAMUSCULAR | Status: AC
  Administered 2011-12-24: 4 mg via INTRAVENOUS

## 2011-12-24 MED ORDER — PROPOFOL 10 MG/ML IV EMUL
INTRAVENOUS | Status: AC
  Filled 2011-12-24: qty 20

## 2011-12-24 MED ORDER — SODIUM CHLORIDE 0.9 % IJ SOLN
INTRAMUSCULAR | Status: AC
  Filled 2011-12-24: qty 10

## 2011-12-24 MED ORDER — EPINEPHRINE HCL 1 MG/ML IJ SOLN
INTRAMUSCULAR | Status: AC
  Filled 2011-12-24: qty 1

## 2011-12-24 MED ORDER — HYDROMORPHONE HCL 4 MG PO TABS: 4.0000 mg | ORAL_TABLET | ORAL | Status: DC | PRN

## 2011-12-24 MED ORDER — ROCURONIUM BROMIDE 50 MG/5ML IV SOLN
INTRAVENOUS | Status: AC
  Filled 2011-12-24: qty 1

## 2011-12-24 MED ORDER — BUPIVACAINE-EPINEPHRINE PF 0.5-1:200000 % IJ SOLN
INTRAMUSCULAR | Status: DC | PRN
  Administered 2011-12-24: 30 mL

## 2011-12-24 MED ORDER — CEFAZOLIN SODIUM-DEXTROSE 2-3 GM-% IV SOLR
INTRAVENOUS | Status: AC
  Filled 2011-12-24: qty 50

## 2011-12-24 MED ORDER — FENTANYL CITRATE 0.05 MG/ML IJ SOLN
INTRAMUSCULAR | Status: DC | PRN
  Administered 2011-12-24: 50 ug via INTRAVENOUS
  Administered 2011-12-24: 100 ug via INTRAVENOUS
  Administered 2011-12-24: 50 ug via INTRAVENOUS

## 2011-12-24 SURGICAL SUPPLY — 55 items
ARTHROWAND PARAGON T2 (SURGICAL WAND)
BAG HAMPER (MISCELLANEOUS) ×3 IMPLANT
BANDAGE ELASTIC 6 VELCRO NS (GAUZE/BANDAGES/DRESSINGS) ×3 IMPLANT
BLADE AGGRESSIVE PLUS 4.0 (BLADE) ×3 IMPLANT
BLADE SURG SZ11 CARB STEEL (BLADE) ×3 IMPLANT
CHLORAPREP W/TINT 26ML (MISCELLANEOUS) ×6 IMPLANT
CLOTH BEACON ORANGE TIMEOUT ST (SAFETY) ×3 IMPLANT
COOLER CRYO IC GRAV AND TUBE (ORTHOPEDIC SUPPLIES) ×3 IMPLANT
CUFF CRYO KNEE LG 20X31 COOLER (ORTHOPEDIC SUPPLIES) ×3 IMPLANT
CUFF CRYO KNEE18X23 MED (MISCELLANEOUS) IMPLANT
CUFF TOURNIQUET SINGLE 34IN LL (TOURNIQUET CUFF) IMPLANT
CUFF TOURNIQUET SINGLE 44IN (TOURNIQUET CUFF) ×3 IMPLANT
CUTTER ANGLED DBL BITE 4.5 (BURR) IMPLANT
DECANTER SPIKE VIAL GLASS SM (MISCELLANEOUS) ×6 IMPLANT
FLOOR PAD 36X40 (MISCELLANEOUS)
GAUZE SPONGE 4X4 16PLY XRAY LF (GAUZE/BANDAGES/DRESSINGS) ×3 IMPLANT
GAUZE XEROFORM 5X9 LF (GAUZE/BANDAGES/DRESSINGS) ×3 IMPLANT
GLOVE EXAM NITRILE MD LF STRL (GLOVE) ×3 IMPLANT
GLOVE INDICATOR 7.0 STRL GRN (GLOVE) ×3 IMPLANT
GLOVE SKINSENSE NS SZ7.0 (GLOVE) ×1
GLOVE SKINSENSE NS SZ8.0 LF (GLOVE) ×1
GLOVE SKINSENSE STRL SZ7.0 (GLOVE) ×2 IMPLANT
GLOVE SKINSENSE STRL SZ8.0 LF (GLOVE) ×2 IMPLANT
GLOVE SS N UNI LF 8.5 STRL (GLOVE) ×3 IMPLANT
GOWN STRL REIN XL XLG (GOWN DISPOSABLE) ×9 IMPLANT
HLDR LEG FOAM (MISCELLANEOUS) ×2 IMPLANT
IV NS IRRIG 3000ML ARTHROMATIC (IV SOLUTION) ×12 IMPLANT
KIT BLADEGUARD II DBL (SET/KITS/TRAYS/PACK) ×3 IMPLANT
KIT ROOM TURNOVER AP CYSTO (KITS) ×3 IMPLANT
LEG HOLDER FOAM (MISCELLANEOUS) ×1
MANIFOLD NEPTUNE II (INSTRUMENTS) ×3 IMPLANT
MARKER SKIN DUAL TIP RULER LAB (MISCELLANEOUS) ×3 IMPLANT
NEEDLE HYPO 18GX1.5 BLUNT FILL (NEEDLE) ×3 IMPLANT
NEEDLE HYPO 21X1.5 SAFETY (NEEDLE) ×3 IMPLANT
NEEDLE SPNL 18GX3.5 QUINCKE PK (NEEDLE) ×3 IMPLANT
NS IRRIG 1000ML POUR BTL (IV SOLUTION) ×3 IMPLANT
PACK ARTHRO LIMB DRAPE STRL (MISCELLANEOUS) ×3 IMPLANT
PAD ABD 5X9 TENDERSORB (GAUZE/BANDAGES/DRESSINGS) ×3 IMPLANT
PAD ARMBOARD 7.5X6 YLW CONV (MISCELLANEOUS) ×3 IMPLANT
PAD FLOOR 36X40 (MISCELLANEOUS) IMPLANT
PADDING CAST ABS 6INX4YD NS (CAST SUPPLIES) ×1
PADDING CAST ABS COTTON 6X4 NS (CAST SUPPLIES) ×2 IMPLANT
PADDING CAST COTTON 6X4 STRL (CAST SUPPLIES) ×3 IMPLANT
SET ARTHROSCOPY INST (INSTRUMENTS) ×3 IMPLANT
SET ARTHROSCOPY PUMP TUBE (IRRIGATION / IRRIGATOR) ×3 IMPLANT
SET BASIN LINEN APH (SET/KITS/TRAYS/PACK) ×3 IMPLANT
SPONGE GAUZE 4X4 12PLY (GAUZE/BANDAGES/DRESSINGS) ×3 IMPLANT
STRIP CLOSURE SKIN 1/2X4 (GAUZE/BANDAGES/DRESSINGS) ×3 IMPLANT
SUT ETHILON 3 0 FSL (SUTURE) ×3 IMPLANT
SYR 30ML LL (SYRINGE) ×3 IMPLANT
SYRINGE 10CC LL (SYRINGE) ×3 IMPLANT
WAND 50 DEG COVAC W/CORD (SURGICAL WAND) IMPLANT
WAND 90 DEG TURBOVAC W/CORD (SURGICAL WAND) ×3 IMPLANT
WAND ARTHRO PARAGON T2 (SURGICAL WAND) IMPLANT
YANKAUER SUCT BULB TIP 10FT TU (MISCELLANEOUS) ×9 IMPLANT

## 2011-12-24 NOTE — Progress Notes (Signed)
Dr Romeo Apple consulted re: zofran order for PACU. No further zofran needed.

## 2011-12-24 NOTE — Progress Notes (Signed)
Awake. C/O feeling cold. Shivering. bair paws warmer applied. Continues to c/o nausea and postop rt knees pain. Med as noted.

## 2011-12-24 NOTE — Brief Op Note (Signed)
12/24/2011  8:34 AM  PATIENT:  Vic Blackbird  46 y.o. female  PRE-OPERATIVE DIAGNOSIS:  Medial meniscus tear POST-OPERATIVE DIAGNOSIS:  osteoarthritis right knee  PROCEDURE:  Procedure(s) (LRB): CHONDROPLASTY (Right) knee medial femoral condyle  FINDINGS: GRADE 2 LINEAR LESION MEDIAL FEMORAL CONDYLE AND GRADE 1 LESION TIBIAL PLATEAU   SURGEON:  Surgeon(s) and Role:    * Vickki Hearing, MD - Primary  PHYSICIAN ASSISTANT:   ASSISTANTS: none   ANESTHESIA:   general  EBL:  Total I/O In: 700 [I.V.:700] Out: -   BLOOD ADMINISTERED:none  DRAINS: none   LOCAL MEDICATIONS USED:  MARCAINE   , Amount: 60 ml and OTHER EPI  SPECIMEN:  No Specimen  DISPOSITION OF SPECIMEN:  N/A  COUNTS:  YES  TOURNIQUET:  * Missing tourniquet times found for documented tourniquets in log:  53614 *  DICTATION: .Dragon Dictation  PLAN OF CARE: DISCHARGE   PATIENT DISPOSITION:  PACU - hemodynamically stable.   Delay start of Pharmacological VTE agent (>24hrs) due to surgical blood loss or risk of bleeding: yes

## 2011-12-24 NOTE — Anesthesia Preprocedure Evaluation (Addendum)
Anesthesia Evaluation  Patient identified by MRN, date of birth, ID band Patient awake    Reviewed: Allergy & Precautions, H&P , NPO status , Patient's Chart, lab work & pertinent test results  History of Anesthesia Complications Negative for: history of anesthetic complications  Airway Mallampati: II TM Distance: >3 FB     Dental  (+) Teeth Intact   Pulmonary neg pulmonary ROS,    Pulmonary exam normal       Cardiovascular negative cardio ROS  Rhythm:Regular Rate:Normal     Neuro/Psych  Neuromuscular disease    GI/Hepatic GERD-  Medicated and Controlled,  Endo/Other    Renal/GU      Musculoskeletal   Abdominal   Peds  Hematology   Anesthesia Other Findings   Reproductive/Obstetrics                           Anesthesia Physical Anesthesia Plan  ASA: II  Anesthesia Plan: General   Post-op Pain Management:    Induction: Intravenous, Rapid sequence and Cricoid pressure planned  Airway Management Planned: Oral ETT  Additional Equipment:   Intra-op Plan:   Post-operative Plan: Extubation in OR  Informed Consent: I have reviewed the patients History and Physical, chart, labs and discussed the procedure including the risks, benefits and alternatives for the proposed anesthesia with the patient or authorized representative who has indicated his/her understanding and acceptance.     Plan Discussed with:   Anesthesia Plan Comments:         Anesthesia Quick Evaluation

## 2011-12-24 NOTE — Progress Notes (Signed)
Awake. States nausea has decreased. Pain level 0 at this time. Wants something to drink. Ginger-ale given to drink. Tolerated well.

## 2011-12-24 NOTE — Anesthesia Procedure Notes (Signed)
Procedure Name: Intubation Date/Time: 12/24/2011 7:44 AM Performed by: Despina Hidden Pre-anesthesia Checklist: Emergency Drugs available, Suction available, Patient being monitored and Patient identified Patient Re-evaluated:Patient Re-evaluated prior to inductionOxygen Delivery Method: Circle system utilized Preoxygenation: Pre-oxygenation with 100% oxygen Intubation Type: IV induction, Cricoid Pressure applied and Rapid sequence Ventilation: Mask ventilation without difficulty Laryngoscope Size: Mac and 3 Grade View: Grade I Tube type: Oral Tube size: 7.0 mm Number of attempts: 1 Airway Equipment and Method: Stylet Placement Confirmation: ETT inserted through vocal cords under direct vision,  positive ETCO2 and breath sounds checked- equal and bilateral Secured at: 22 cm Tube secured with: Tape Dental Injury: Teeth and Oropharynx as per pre-operative assessment

## 2011-12-24 NOTE — Progress Notes (Signed)
Awake. Talking. C/O nausea. Retching. No emesis. Dr Jayme Cloud notified. Orders given.

## 2011-12-24 NOTE — Interval H&P Note (Signed)
History and Physical Interval Note:  12/24/2011 7:23 AM  Sharon Owen  has presented today for surgery, with the diagnosis of right knee arthroscopy with medial menisectomy  The various methods of treatment have been discussed with the patient and family. After consideration of risks, benefits and other options for treatment, the patient has consented to  Procedure(s) (LRB): KNEE ARTHROSCOPY WITH MEDIAL MENISECTOMY (Right) as a surgical intervention .  The patient's history has been reviewed, patient examined, no change in status, stable for surgery.  I have reviewed the patient's chart and labs.  Questions were answered to the patient's satisfaction.     Fuller Canada

## 2011-12-24 NOTE — Anesthesia Postprocedure Evaluation (Signed)
  Anesthesia Post-op Note  Patient: Sharon Owen  Procedure(s) Performed: Procedure(s) (LRB): CHONDROPLASTY (Right)  Patient Location: PACU  Anesthesia Type: General  Level of Consciousness: awake, alert , oriented and patient cooperative  Airway and Oxygen Therapy: Patient Spontanous Breathing  Post-op Pain: 3 /10, mild  Post-op Assessment: Post-op Vital signs reviewed, Patient's Cardiovascular Status Stable, Respiratory Function Stable, Patent Airway, NAUSEA AND VOMITING PRESENT and Pain level controlled  Post-op Vital Signs: Reviewed and stable  Complications: No apparent anesthesia complications

## 2011-12-24 NOTE — Progress Notes (Signed)
Awake. Talking tolerating ginger-ale well. No shivering. Rates pain 2. Request further pain med.

## 2011-12-24 NOTE — Op Note (Addendum)
Operative report  Date 12/24/2011  Preop diagnosis torn medial meniscus degenerative changes anterior cruciate ligament right knee  Postoperative diagnosis osteoarthritis right knee  Procedure arthroscopy chondroplasty medial femoral condyle  Findings grade 2 lesion linear medial femoral condyle, grade 1 lesion tibial plateau. Degenerative changes anterior cruciate ligament anterior cruciate ligament intact. Lateral compartment normal. Patellofemoral joint normal.  Surgeon Romeo Apple  Anesthesia Gen.  Details of procedure: The patient was identified in the preop area the site was confirmed as right knee and marked. Chart update was completed  Patient was taken to the operating room for general anesthesia. In supine position the right leg was placed in an arthroscopic leg holder the left leg was placed in a well leg holder. The leg was prepped and draped sterilely.  The timeout procedure was executed. Site confirmed as right knee patient confirmed as Sharon Owen  Preoperative antibiotics were given.  The joint was injected with 15 cc of Marcaine with epinephrine.  A lateral portal was created. The scope was placed into the joint. A diagnostic arthroscopy was performed reviewing the entire joint. A medial portal was established with a spinal needle. The probe was placed in the joint and the diagnostic portion of the procedure was repeated using the probe to evaluate the structures in the joint.  A shaver and ArthroCare wand were used to perform a chondroplasty of the medial femoral condyle.  The knee was irrigated twice on the wash mode of the arthroscopic pump. Any debris in the joint was removed.  The lateral portal was closed with 2 3-0 nylon interrupted sutures, the same was done on the medial portal. We injected the knee with an additional 45 cc of Marcaine with epinephrine.  Sterile dressings were applied as well as an Ace wrap. A Cryo/Cuff was applied and activated.  After  extubation the patient was taken to the recovery room in stable condition  Discharge home in stable  Weightbearing as tolerated with walker or crutches

## 2011-12-24 NOTE — Transfer of Care (Signed)
Immediate Anesthesia Transfer of Care Note  Patient: Sharon Owen  Procedure(s) Performed: Procedure(s) (LRB): CHONDROPLASTY (Right)  Patient Location: PACU  Anesthesia Type: General  Level of Consciousness: awake, alert , oriented and patient cooperative  Airway & Oxygen Therapy: Patient Spontanous Breathing and Patient connected to face mask oxygen  Post-op Assessment: Report given to PACU RN, Post -op Vital signs reviewed and stable and Patient moving all extremities  Post vital signs: Reviewed and stable  Complications: No apparent anesthesia complications

## 2011-12-28 ENCOUNTER — Encounter: Payer: Self-pay | Admitting: Orthopedic Surgery

## 2011-12-28 ENCOUNTER — Ambulatory Visit (INDEPENDENT_AMBULATORY_CARE_PROVIDER_SITE_OTHER): Payer: BC Managed Care – PPO | Admitting: Orthopedic Surgery

## 2011-12-28 VITALS — BP 118/60 | Ht 66.0 in | Wt 235.0 lb

## 2011-12-28 DIAGNOSIS — Z9889 Other specified postprocedural states: Secondary | ICD-10-CM

## 2011-12-28 MED ORDER — HYDROMORPHONE HCL 4 MG PO TABS: 4.0000 mg | ORAL_TABLET | ORAL | Status: AC | PRN

## 2011-12-28 NOTE — Patient Instructions (Addendum)
START THERAPY AT [facility] [place]

## 2011-12-28 NOTE — Progress Notes (Signed)
Patient ID: Sharon Owen, female   DOB: 12-21-1965, 46 y.o.   MRN: [id number] Chief Complaint  Patient presents with  . Routine Post Op    post op 1, Right knee, DOS 12/24/11    BP 118/60  Ht 5\' 6"  (1.676 m)  Wt 235 lb (106.595 kg)  BMI 37.93 kg/m2  The incisions are clean   Knee flexion 85 degrees  Start PT   Return 3 weeks

## 2011-12-29 ENCOUNTER — Encounter (HOSPITAL_COMMUNITY): Payer: Self-pay | Admitting: Orthopedic Surgery

## 2012-01-19 ENCOUNTER — Ambulatory Visit: Payer: BC Managed Care – PPO | Admitting: Orthopedic Surgery

## 2012-01-26 ENCOUNTER — Encounter: Payer: Self-pay | Admitting: Orthopedic Surgery

## 2012-01-26 ENCOUNTER — Ambulatory Visit (INDEPENDENT_AMBULATORY_CARE_PROVIDER_SITE_OTHER): Payer: BC Managed Care – PPO | Admitting: Orthopedic Surgery

## 2012-01-26 VITALS — BP 100/62 | Ht 66.0 in | Wt 235.0 lb

## 2012-01-26 DIAGNOSIS — G4700 Insomnia, unspecified: Secondary | ICD-10-CM

## 2012-01-26 MED ORDER — ZOLPIDEM TARTRATE 10 MG PO TABS: 10.0000 mg | ORAL_TABLET | Freq: Every evening | ORAL | Status: DC | PRN

## 2012-01-26 NOTE — Progress Notes (Signed)
Patient ID: Sharon Owen, female   DOB: 11-20-1965, 46 y.o.   MRN: [id number] Chief Complaint  Patient presents with  . Follow-up    recheck Rt knee, DOS 12/24/11    Status post knee arthroscopy  Doing well at their a sports physical therapy she still has mild residual lip and some anterolateral knee pain especially with knee flexion  Otherwise her knee looks good she can gradually resume normal activities finish her physical therapy if needed and followup with [place] as needed

## 2012-01-26 NOTE — Patient Instructions (Addendum)
Gradually return to normal activity  Use ice as needed to control swelling  Take pain medication as needed. Try to take over-the-counter medication first.  You may have some episodes of swelling as she continued to improve and use the leg more.

## 2012-01-27 ENCOUNTER — Telehealth: Payer: Self-pay | Admitting: Orthopedic Surgery

## 2012-01-27 ENCOUNTER — Other Ambulatory Visit: Payer: Self-pay | Admitting: *Deleted

## 2012-01-27 DIAGNOSIS — G4700 Insomnia, unspecified: Secondary | ICD-10-CM

## 2012-01-27 MED ORDER — ZOLPIDEM TARTRATE 10 MG PO TABS: 10.0000 mg | ORAL_TABLET | Freq: Every evening | ORAL | Status: DC | PRN

## 2012-01-27 NOTE — Telephone Encounter (Signed)
Med faxed as requested

## 2012-06-10 ENCOUNTER — Other Ambulatory Visit: Payer: Self-pay

## 2012-08-14 ENCOUNTER — Other Ambulatory Visit: Payer: Self-pay | Admitting: *Deleted

## 2012-08-14 DIAGNOSIS — M23321 Other meniscus derangements, posterior horn of medial meniscus, right knee: Secondary | ICD-10-CM

## 2012-08-14 MED ORDER — DICLOFENAC POTASSIUM 50 MG PO TABS: 50.0000 mg | ORAL_TABLET | Freq: Two times a day (BID) | ORAL | Status: AC

## 2012-09-07 ENCOUNTER — Other Ambulatory Visit: Payer: Self-pay | Admitting: *Deleted

## 2012-09-07 ENCOUNTER — Telehealth: Payer: Self-pay | Admitting: Orthopedic Surgery

## 2012-09-07 DIAGNOSIS — M1711 Unilateral primary osteoarthritis, right knee: Secondary | ICD-10-CM

## 2012-09-07 MED ORDER — DICLOFENAC SODIUM 1 % TD GEL: 4.0000 g | Freq: Four times a day (QID) | TRANSDERMAL | Status: DC

## 2012-09-07 NOTE — Telephone Encounter (Signed)
Patient called to relay that her operative knee is hurting quite a bit.  States she is working full time, and going to school, therefore, said unable to schedule an appointment for re-evaluation, which I offered.  She is inquiring about the medication Voltaren (gel); asking if Dr. Romeo Apple would consider prescribing it.  If so, her pharmacy is [facility], [place].  Patient ph# [phone].

## 2012-09-07 NOTE — Telephone Encounter (Signed)
Sent in prescription to [facility] for Voltaren gel. Patient informed.

## 2013-03-01 ENCOUNTER — Other Ambulatory Visit: Payer: Self-pay

## 2013-12-27 LAB — LIPID PANEL
Cholesterol: 178 mg/dL (ref 0–200)
HDL: 52 mg/dL (ref 35–70)
LDL Cholesterol: 111 mg/dL
Triglycerides: 73 mg/dL (ref 40–160)

## 2013-12-27 LAB — HEMOGLOBIN A1C: Hgb A1c MFr Bld: 6.1 % — AB (ref 4.0–6.0)

## 2014-05-23 ENCOUNTER — Emergency Department (HOSPITAL_COMMUNITY): Payer: BLUE CROSS/BLUE SHIELD

## 2014-05-23 ENCOUNTER — Emergency Department (HOSPITAL_COMMUNITY): Payer: BLUE CROSS/BLUE SHIELD | Admitting: Certified Registered Nurse Anesthetist

## 2014-05-23 ENCOUNTER — Encounter (HOSPITAL_COMMUNITY)
Admission: EM | Disposition: A | Discharge status: Home Health | Payer: Self-pay | Source: Home / Self Care | Attending: Orthopedic Surgery

## 2014-05-23 ENCOUNTER — Encounter (HOSPITAL_COMMUNITY): Payer: Self-pay | Admitting: Emergency Medicine

## 2014-05-23 ENCOUNTER — Inpatient Hospital Stay (HOSPITAL_COMMUNITY)
Admission: EM | Admit: 2014-05-23 | Discharge: 2014-05-25 | DRG: 492 | Disposition: A | Discharge status: Home Health | Payer: BLUE CROSS/BLUE SHIELD | Attending: Orthopedic Surgery | Admitting: Orthopedic Surgery

## 2014-05-23 DIAGNOSIS — Z79899 Other long term (current) drug therapy: Secondary | ICD-10-CM | POA: Diagnosis not present

## 2014-05-23 DIAGNOSIS — Z885 Allergy status to narcotic agent status: Secondary | ICD-10-CM | POA: Diagnosis not present

## 2014-05-23 DIAGNOSIS — S93492A Sprain of other ligament of left ankle, initial encounter: Secondary | ICD-10-CM | POA: Diagnosis present

## 2014-05-23 DIAGNOSIS — Z91040 Latex allergy status: Secondary | ICD-10-CM

## 2014-05-23 DIAGNOSIS — S82391B Other fracture of lower end of right tibia, initial encounter for open fracture type I or II: Principal | ICD-10-CM | POA: Diagnosis present

## 2014-05-23 DIAGNOSIS — Z87828 Personal history of other (healed) physical injury and trauma: Secondary | ICD-10-CM | POA: Insufficient documentation

## 2014-05-23 DIAGNOSIS — Z7982 Long term (current) use of aspirin: Secondary | ICD-10-CM | POA: Diagnosis not present

## 2014-05-23 DIAGNOSIS — Y92018 Other place in single-family (private) house as the place of occurrence of the external cause: Secondary | ICD-10-CM | POA: Diagnosis not present

## 2014-05-23 DIAGNOSIS — S82831B Other fracture of upper and lower end of right fibula, initial encounter for open fracture type I or II: Secondary | ICD-10-CM | POA: Diagnosis present

## 2014-05-23 DIAGNOSIS — W1789XA Other fall from one level to another, initial encounter: Secondary | ICD-10-CM | POA: Diagnosis present

## 2014-05-23 DIAGNOSIS — S82209A Unspecified fracture of shaft of unspecified tibia, initial encounter for closed fracture: Secondary | ICD-10-CM

## 2014-05-23 DIAGNOSIS — S93402A Sprain of unspecified ligament of left ankle, initial encounter: Secondary | ICD-10-CM | POA: Diagnosis present

## 2014-05-23 DIAGNOSIS — Z8781 Personal history of (healed) traumatic fracture: Secondary | ICD-10-CM | POA: Diagnosis present

## 2014-05-23 DIAGNOSIS — M79604 Pain in right leg: Secondary | ICD-10-CM | POA: Diagnosis present

## 2014-05-23 DIAGNOSIS — S82409A Unspecified fracture of shaft of unspecified fibula, initial encounter for closed fracture: Secondary | ICD-10-CM

## 2014-05-23 DIAGNOSIS — G8918 Other acute postprocedural pain: Secondary | ICD-10-CM | POA: Diagnosis not present

## 2014-05-23 DIAGNOSIS — W19XXXA Unspecified fall, initial encounter: Secondary | ICD-10-CM

## 2014-05-23 HISTORY — PX: TIBIA IM NAIL INSERTION: SHX2516

## 2014-05-23 LAB — CBC WITH DIFFERENTIAL/PLATELET
Basophils Absolute: 0 10*3/uL (ref 0.0–0.1)
Basophils Relative: 0 % (ref 0–1)
Eosinophils Absolute: 0.1 10*3/uL (ref 0.0–0.7)
Eosinophils Relative: 1 % (ref 0–5)
HCT: 42.8 % (ref 36.0–46.0)
Hemoglobin: 14 g/dL (ref 12.0–15.0)
Lymphocytes Relative: 25 % (ref 12–46)
Lymphs Abs: 2.4 10*3/uL (ref 0.7–4.0)
MCH: 29.3 pg (ref 26.0–34.0)
MCHC: 32.7 g/dL (ref 30.0–36.0)
MCV: 89.5 fL (ref 78.0–100.0)
Monocytes Absolute: 0.6 10*3/uL (ref 0.1–1.0)
Monocytes Relative: 6 % (ref 3–12)
Neutro Abs: 6.7 10*3/uL (ref 1.7–7.7)
Neutrophils Relative %: 68 % (ref 43–77)
Platelets: 242 10*3/uL (ref 150–400)
RBC: 4.78 MIL/uL (ref 3.87–5.11)
RDW: 14.8 % (ref 11.5–15.5)
WBC: 9.9 10*3/uL (ref 4.0–10.5)

## 2014-05-23 LAB — URINALYSIS, ROUTINE W REFLEX MICROSCOPIC
Bilirubin Urine: NEGATIVE
Glucose, UA: NEGATIVE mg/dL
Hgb urine dipstick: NEGATIVE
Ketones, ur: 15 mg/dL — AB
Leukocytes, UA: NEGATIVE
Nitrite: NEGATIVE
Protein, ur: NEGATIVE mg/dL
Specific Gravity, Urine: 1.027 (ref 1.005–1.030)
Urobilinogen, UA: 0.2 mg/dL (ref 0.0–1.0)
pH: 5.5 (ref 5.0–8.0)

## 2014-05-23 LAB — BASIC METABOLIC PANEL
Anion gap: 12 (ref 5–15)
BUN: 11 mg/dL (ref 6–23)
CO2: 21 mmol/L (ref 19–32)
Calcium: 8.9 mg/dL (ref 8.4–10.5)
Chloride: 107 mmol/L (ref 96–112)
Creatinine, Ser: 0.75 mg/dL (ref 0.50–1.10)
GFR calc Af Amer: >90 mL/min (ref >90)
GFR calc non Af Amer: >90 mL/min (ref >90)
Glucose, Bld: 88 mg/dL (ref 70–99)
Potassium: 4 mmol/L (ref 3.5–5.1)
Sodium: 140 mmol/L (ref 135–145)

## 2014-05-23 LAB — PROTIME-INR
INR: 0.99 (ref 0.00–1.49)
Prothrombin Time: 13.2 seconds (ref 11.6–15.2)

## 2014-05-23 SURGERY — INSERTION, INTRAMEDULLARY ROD, TIBIA
Anesthesia: General | Site: Leg Lower | Laterality: Right

## 2014-05-23 MED ORDER — KETOROLAC TROMETHAMINE 30 MG/ML IJ SOLN
INTRAMUSCULAR | Status: AC
  Filled 2014-05-23: qty 1

## 2014-05-23 MED ORDER — ARTIFICIAL TEARS OP OINT
TOPICAL_OINTMENT | OPHTHALMIC | Status: DC | PRN
  Administered 2014-05-23: 1 via OPHTHALMIC

## 2014-05-23 MED ORDER — KETOROLAC TROMETHAMINE 30 MG/ML IJ SOLN
15.0000 mg | Freq: Four times a day (QID) | INTRAMUSCULAR | Status: AC
  Administered 2014-05-23 – 2014-05-24 (×4): 15 mg via INTRAVENOUS
  Filled 2014-05-23 (×6): qty 1

## 2014-05-23 MED ORDER — ROCURONIUM BROMIDE 100 MG/10ML IV SOLN
INTRAVENOUS | Status: DC | PRN
  Administered 2014-05-23: 20 mg via INTRAVENOUS

## 2014-05-23 MED ORDER — CEFAZOLIN SODIUM-DEXTROSE 2-3 GM-% IV SOLR
2.0000 g | Freq: Once | INTRAVENOUS | Status: AC
  Administered 2014-05-23: 2 g via INTRAVENOUS
  Filled 2014-05-23: qty 50

## 2014-05-23 MED ORDER — METHOCARBAMOL 500 MG PO TABS: 500.0000 mg | ORAL_TABLET | Freq: Three times a day (TID) | ORAL | Status: DC | PRN

## 2014-05-23 MED ORDER — HYDROMORPHONE HCL 1 MG/ML IJ SOLN
INTRAMUSCULAR | Status: AC
  Administered 2014-05-23: 0.5 mg via INTRAVENOUS
  Filled 2014-05-23: qty 1

## 2014-05-23 MED ORDER — HYDROMORPHONE HCL 1 MG/ML IJ SOLN
0.2500 mg | INTRAMUSCULAR | Status: DC | PRN
  Administered 2014-05-23 (×3): 0.5 mg via INTRAVENOUS
  Administered 2014-05-23: 1 mg via INTRAVENOUS
  Administered 2014-05-23: 0.5 mg via INTRAVENOUS

## 2014-05-23 MED ORDER — HYDROMORPHONE HCL 1 MG/ML IJ SOLN
1.0000 mg | INTRAMUSCULAR | Status: DC | PRN
  Administered 2014-05-23 – 2014-05-24 (×3): 1 mg via INTRAVENOUS
  Administered 2014-05-24: 2 mg via INTRAVENOUS
  Administered 2014-05-24 (×2): 1 mg via INTRAVENOUS
  Filled 2014-05-23 (×2): qty 2
  Filled 2014-05-23 (×5): qty 1

## 2014-05-23 MED ORDER — HYDROMORPHONE HCL 1 MG/ML IJ SOLN
1.0000 mg | Freq: Once | INTRAMUSCULAR | Status: AC
  Administered 2014-05-23: 1 mg via INTRAVENOUS
  Filled 2014-05-23: qty 1

## 2014-05-23 MED ORDER — CEFAZOLIN SODIUM-DEXTROSE 2-3 GM-% IV SOLR
2.0000 g | INTRAVENOUS | Status: AC
  Administered 2014-05-23: 2 g via INTRAVENOUS

## 2014-05-23 MED ORDER — DEXTROSE-NACL 5-0.45 % IV SOLN
INTRAVENOUS | Status: DC
  Administered 2014-05-23 – 2014-05-24 (×2): via INTRAVENOUS
  Administered 2014-05-24: 75 mL/h via INTRAVENOUS

## 2014-05-23 MED ORDER — OXYCODONE-ACETAMINOPHEN 5-325 MG PO TABS
1.0000 | ORAL_TABLET | ORAL | Status: DC | PRN
  Administered 2014-05-23 – 2014-05-25 (×8): 2 via ORAL
  Filled 2014-05-23 (×7): qty 2

## 2014-05-23 MED ORDER — PROPOFOL 10 MG/ML IV BOLUS
INTRAVENOUS | Status: AC
  Filled 2014-05-23: qty 20

## 2014-05-23 MED ORDER — ONDANSETRON HCL 4 MG/2ML IJ SOLN
4.0000 mg | Freq: Once | INTRAMUSCULAR | Status: AC
  Administered 2014-05-23: 4 mg via INTRAVENOUS
  Filled 2014-05-23: qty 2

## 2014-05-23 MED ORDER — SUCCINYLCHOLINE CHLORIDE 20 MG/ML IJ SOLN
INTRAMUSCULAR | Status: DC | PRN
Start: 2014-05-23 — End: 2014-05-23
  Administered 2014-05-23: 120 mg via INTRAVENOUS

## 2014-05-23 MED ORDER — PROPOFOL 10 MG/ML IV BOLUS
INTRAVENOUS | Status: DC | PRN
  Administered 2014-05-23: 150 mg via INTRAVENOUS

## 2014-05-23 MED ORDER — DEXTROSE 5 % IV SOLN
500.0000 mg | Freq: Four times a day (QID) | INTRAVENOUS | Status: DC | PRN
  Filled 2014-05-23: qty 5

## 2014-05-23 MED ORDER — KETOROLAC TROMETHAMINE 30 MG/ML IJ SOLN
INTRAMUSCULAR | Status: DC | PRN
Start: 2014-05-23 — End: 2014-05-23
  Administered 2014-05-23: 30 mg via INTRAVENOUS

## 2014-05-23 MED ORDER — CEFAZOLIN SODIUM-DEXTROSE 2-3 GM-% IV SOLR
2.0000 g | Freq: Four times a day (QID) | INTRAVENOUS | Status: AC
  Administered 2014-05-23 – 2014-05-25 (×6): 2 g via INTRAVENOUS
  Filled 2014-05-23 (×6): qty 50

## 2014-05-23 MED ORDER — 0.9 % SODIUM CHLORIDE (POUR BTL) OPTIME
TOPICAL | Status: DC | PRN
  Administered 2014-05-23: 1000 mL

## 2014-05-23 MED ORDER — METHOCARBAMOL 500 MG PO TABS
ORAL_TABLET | ORAL | Status: AC
  Administered 2014-05-23: 500 mg via ORAL
  Filled 2014-05-23: qty 1

## 2014-05-23 MED ORDER — ONDANSETRON HCL 4 MG/2ML IJ SOLN
INTRAMUSCULAR | Status: DC | PRN
  Administered 2014-05-23: 4 mg via INTRAVENOUS

## 2014-05-23 MED ORDER — HYDROMORPHONE HCL 1 MG/ML IJ SOLN
INTRAMUSCULAR | Status: AC
  Administered 2014-05-23: 1 mg via INTRAVENOUS
  Filled 2014-05-23: qty 2

## 2014-05-23 MED ORDER — LACTATED RINGERS IV SOLN
INTRAVENOUS | Status: DC | PRN
  Administered 2014-05-23 (×2): via INTRAVENOUS

## 2014-05-23 MED ORDER — ASPIRIN EC 325 MG PO TBEC
325.0000 mg | DELAYED_RELEASE_TABLET | Freq: Every day | ORAL | Status: DC
Start: 2014-05-23 — End: 2015-03-11

## 2014-05-23 MED ORDER — CHLORHEXIDINE GLUCONATE 4 % EX LIQD
60.0000 mL | Freq: Once | CUTANEOUS | Status: DC
Start: 2014-05-23 — End: 2014-05-23

## 2014-05-23 MED ORDER — LACTATED RINGERS IV SOLN: INTRAVENOUS | Status: DC

## 2014-05-23 MED ORDER — PANTOPRAZOLE SODIUM 40 MG PO TBEC
40.0000 mg | DELAYED_RELEASE_TABLET | Freq: Every day | ORAL | Status: DC
  Administered 2014-05-23 – 2014-05-25 (×3): 40 mg via ORAL
  Filled 2014-05-23 (×3): qty 1

## 2014-05-23 MED ORDER — MIDAZOLAM HCL 2 MG/2ML IJ SOLN
INTRAMUSCULAR | Status: AC
  Filled 2014-05-23: qty 2

## 2014-05-23 MED ORDER — CEPHALEXIN 500 MG PO CAPS: 500.0000 mg | ORAL_CAPSULE | Freq: Three times a day (TID) | ORAL | Status: DC

## 2014-05-23 MED ORDER — OXYCODONE-ACETAMINOPHEN 5-325 MG PO TABS: 1.0000 | ORAL_TABLET | ORAL | Status: DC | PRN

## 2014-05-23 MED ORDER — OXYCODONE-ACETAMINOPHEN 5-325 MG PO TABS
ORAL_TABLET | ORAL | Status: AC
  Administered 2014-05-23: 2 via ORAL
  Filled 2014-05-23: qty 2

## 2014-05-23 MED ORDER — FENTANYL CITRATE 0.05 MG/ML IJ SOLN
INTRAMUSCULAR | Status: AC
  Filled 2014-05-23: qty 5

## 2014-05-23 MED ORDER — PROMETHAZINE HCL 25 MG/ML IJ SOLN: 12.5000 mg | Freq: Four times a day (QID) | INTRAMUSCULAR | Status: DC | PRN

## 2014-05-23 MED ORDER — ASPIRIN EC 325 MG PO TBEC
325.0000 mg | DELAYED_RELEASE_TABLET | Freq: Every day | ORAL | Status: DC
  Administered 2014-05-24 – 2014-05-25 (×2): 325 mg via ORAL
  Filled 2014-05-23 (×2): qty 1

## 2014-05-23 MED ORDER — DIPHENHYDRAMINE HCL 12.5 MG/5ML PO ELIX
12.5000 mg | ORAL_SOLUTION | ORAL | Status: DC | PRN
Start: 2014-05-23 — End: 2014-05-25
  Administered 2014-05-24 (×4): 25 mg via ORAL
  Administered 2014-05-25: 12.5 mg via ORAL
  Filled 2014-05-23 (×5): qty 10

## 2014-05-23 MED ORDER — SCOPOLAMINE 1 MG/3DAYS TD PT72
MEDICATED_PATCH | TRANSDERMAL | Status: AC
  Administered 2014-05-23: 1 via TRANSDERMAL
  Filled 2014-05-23: qty 1

## 2014-05-23 MED ORDER — METHOCARBAMOL 500 MG PO TABS
500.0000 mg | ORAL_TABLET | Freq: Four times a day (QID) | ORAL | Status: DC | PRN
  Administered 2014-05-23 – 2014-05-25 (×5): 500 mg via ORAL
  Filled 2014-05-23 (×4): qty 1

## 2014-05-23 MED ORDER — ONDANSETRON HCL 4 MG/2ML IJ SOLN
4.0000 mg | Freq: Four times a day (QID) | INTRAMUSCULAR | Status: DC | PRN
  Administered 2014-05-23: 4 mg via INTRAVENOUS
  Filled 2014-05-23: qty 2

## 2014-05-23 MED ORDER — CEFAZOLIN SODIUM-DEXTROSE 2-3 GM-% IV SOLR
INTRAVENOUS | Status: AC
  Filled 2014-05-23: qty 50

## 2014-05-23 MED ORDER — FENTANYL CITRATE 0.05 MG/ML IJ SOLN
INTRAMUSCULAR | Status: DC | PRN
  Administered 2014-05-23 (×5): 50 ug via INTRAVENOUS

## 2014-05-23 MED ORDER — LIDOCAINE HCL (CARDIAC) 20 MG/ML IV SOLN
INTRAVENOUS | Status: DC | PRN
  Administered 2014-05-23: 60 mg via INTRAVENOUS

## 2014-05-23 MED ORDER — DEXAMETHASONE SODIUM PHOSPHATE 4 MG/ML IJ SOLN
INTRAMUSCULAR | Status: DC | PRN
  Administered 2014-05-23: 4 mg via INTRAVENOUS

## 2014-05-23 MED ORDER — GLYCOPYRROLATE 0.2 MG/ML IJ SOLN
INTRAMUSCULAR | Status: DC | PRN
  Administered 2014-05-23: 0.6 mg via INTRAVENOUS

## 2014-05-23 MED ORDER — NEOSTIGMINE METHYLSULFATE 10 MG/10ML IV SOLN
INTRAVENOUS | Status: DC | PRN
  Administered 2014-05-23: 4 mg via INTRAVENOUS

## 2014-05-23 MED ORDER — MIDAZOLAM HCL 5 MG/5ML IJ SOLN
INTRAMUSCULAR | Status: DC | PRN
  Administered 2014-05-23: 2 mg via INTRAVENOUS

## 2014-05-23 MED ORDER — ONDANSETRON HCL 4 MG PO TABS: 4.0000 mg | ORAL_TABLET | Freq: Four times a day (QID) | ORAL | Status: DC | PRN

## 2014-05-23 SURGICAL SUPPLY — 63 items
BANDAGE ELASTIC 4 VELCRO ST LF (GAUZE/BANDAGES/DRESSINGS) ×2 IMPLANT
BANDAGE ELASTIC 6 VELCRO ST LF (GAUZE/BANDAGES/DRESSINGS) ×2 IMPLANT
BANDAGE ESMARK 6X9 LF (GAUZE/BANDAGES/DRESSINGS) ×1 IMPLANT
BIT DRILL 3.8X6 NS (BIT) ×2 IMPLANT
BIT DRILL 4.4 NS (BIT) ×2 IMPLANT
BLADE SURG 15 STRL LF DISP TIS (BLADE) ×1 IMPLANT
BLADE SURG 15 STRL SS (BLADE) ×1
BLADE SURG ROTATE 9660 (MISCELLANEOUS) IMPLANT
BNDG COHESIVE 6X5 TAN STRL LF (GAUZE/BANDAGES/DRESSINGS) ×2 IMPLANT
BNDG ESMARK 6X9 LF (GAUZE/BANDAGES/DRESSINGS) ×2
BNDG GAUZE ELAST 4 BULKY (GAUZE/BANDAGES/DRESSINGS) ×2 IMPLANT
CUFF TOURNIQUET SINGLE 34IN LL (TOURNIQUET CUFF) IMPLANT
CUFF TOURNIQUET SINGLE 44IN (TOURNIQUET CUFF) ×2 IMPLANT
DRAPE C-ARM 42X72 X-RAY (DRAPES) ×2 IMPLANT
DRAPE IMP U-DRAPE 54X76 (DRAPES) ×2 IMPLANT
DRAPE ORTHO SPLIT 77X108 STRL (DRAPES) ×2
DRAPE PROXIMA HALF (DRAPES) ×4 IMPLANT
DRAPE SURG ORHT 6 SPLT 77X108 (DRAPES) ×2 IMPLANT
DRAPE U-SHAPE 47X51 STRL (DRAPES) ×2 IMPLANT
DURAPREP 26ML APPLICATOR (WOUND CARE) ×2 IMPLANT
ELECT REM PT RETURN 9FT ADLT (ELECTROSURGICAL) ×2
ELECTRODE REM PT RTRN 9FT ADLT (ELECTROSURGICAL) ×1 IMPLANT
GAUZE SPONGE 4X4 12PLY STRL (GAUZE/BANDAGES/DRESSINGS) ×4 IMPLANT
GAUZE XEROFORM 1X8 LF (GAUZE/BANDAGES/DRESSINGS) ×2 IMPLANT
GAUZE XEROFORM 5X9 LF (GAUZE/BANDAGES/DRESSINGS) ×2 IMPLANT
GLOVE BIOGEL PI IND STRL 8 (GLOVE) ×2 IMPLANT
GLOVE BIOGEL PI INDICATOR 8 (GLOVE) ×2
GLOVE ECLIPSE 7.5 STRL STRAW (GLOVE) IMPLANT
GOWN STRL REUS W/ TWL LRG LVL3 (GOWN DISPOSABLE) ×1 IMPLANT
GOWN STRL REUS W/ TWL XL LVL3 (GOWN DISPOSABLE) ×2 IMPLANT
GOWN STRL REUS W/TWL LRG LVL3 (GOWN DISPOSABLE) ×1
GOWN STRL REUS W/TWL XL LVL3 (GOWN DISPOSABLE) ×2
GUIDEPIN 3.2X17.5 THRD DISP (PIN) ×2 IMPLANT
GUIDEWIRE BALL NOSE 80CM (WIRE) ×2 IMPLANT
KIT BASIN OR (CUSTOM PROCEDURE TRAY) ×2 IMPLANT
KIT ROOM TURNOVER OR (KITS) ×2 IMPLANT
MANIFOLD NEPTUNE II (INSTRUMENTS) ×2 IMPLANT
NAIL TIBIAL 9MMX36CM (Nail) ×2 IMPLANT
NEEDLE 22X1 1/2 (OR ONLY) (NEEDLE) ×2 IMPLANT
NS IRRIG 1000ML POUR BTL (IV SOLUTION) ×2 IMPLANT
PACK GENERAL/GYN (CUSTOM PROCEDURE TRAY) ×2 IMPLANT
PACK UNIVERSAL I (CUSTOM PROCEDURE TRAY) ×2 IMPLANT
PAD ABD 8X10 STRL (GAUZE/BANDAGES/DRESSINGS) ×4 IMPLANT
PAD ARMBOARD 7.5X6 YLW CONV (MISCELLANEOUS) ×4 IMPLANT
PAD CAST 4YDX4 CTTN HI CHSV (CAST SUPPLIES) ×1 IMPLANT
PADDING CAST COTTON 4X4 STRL (CAST SUPPLIES) ×1
PADDING CAST COTTON 6X4 STRL (CAST SUPPLIES) ×2 IMPLANT
SCREW ACECAP 30MM (Screw) ×2 IMPLANT
SCREW CORT BONE 4.5X32 1402232 (Screw) ×2 IMPLANT
SCREW CORT BONE 4.5X38 1402238 (Screw) ×2 IMPLANT
SCREW LOCK PROX 5.5X65 151565 (Screw) ×2 IMPLANT
SCREW PROXIMAL DEPUY (Screw) ×1 IMPLANT
SCREW PRXML FT 50X5.5XLCK NS (Screw) ×1 IMPLANT
SPLINT FIBERGLASS 4X30 (CAST SUPPLIES) ×2 IMPLANT
SPONGE GAUZE 4X4 12PLY STER LF (GAUZE/BANDAGES/DRESSINGS) ×4 IMPLANT
STAPLER VISISTAT 35W (STAPLE) ×2 IMPLANT
STOCKINETTE IMPERVIOUS LG (DRAPES) ×2 IMPLANT
SUT VIC AB 0 CTB1 27 (SUTURE) ×2 IMPLANT
SUT VIC AB 2-0 CTB1 (SUTURE) ×2 IMPLANT
SYR CONTROL 10ML LL (SYRINGE) ×2 IMPLANT
TOWEL OR 17X24 6PK STRL BLUE (TOWEL DISPOSABLE) ×2 IMPLANT
TOWEL OR 17X26 10 PK STRL BLUE (TOWEL DISPOSABLE) ×2 IMPLANT
WATER STERILE IRR 1000ML POUR (IV SOLUTION) IMPLANT

## 2014-05-23 NOTE — Brief Op Note (Signed)
05/23/2014  7:34 PM  PATIENT:  Sharon Owen  49 y.o. female  PRE-OPERATIVE DIAGNOSIS:  RIGHT DISTAL TIBIA/FIBULA FRACTURE  POST-OPERATIVE DIAGNOSIS:  RIGHT DISTAL TIBIA/FIBULA FRACTURE  PROCEDURE:  Procedure(s): INTRAMEDULLARY (IM) NAIL RIGHT TIBIA (Right)  SURGEON:  Surgeon(s) and Role:    * Harvie JuniorJohn L Yoko Mcgahee, MD - Primary  PHYSICIAN ASSISTANT:   ASSISTANTS: bethune   ANESTHESIA:   general  EBL:  Total I/O In: 400 [I.V.:400] Out: 300 [Urine:300]  BLOOD ADMINISTERED:none  DRAINS: none   LOCAL MEDICATIONS USED:  NONE  SPECIMEN:  No Specimen  DISPOSITION OF SPECIMEN:  N/A  COUNTS:  YES  TOURNIQUET:   Total Tourniquet Time Documented: Thigh (Right) - 49 minutes Total: Thigh (Right) - 49 minutes   DICTATION: .Other Dictation: Dictation Number [id number]  PLAN OF CARE: Admit to inpatient   PATIENT DISPOSITION:  PACU - hemodynamically stable.   Delay start of Pharmacological VTE agent (>24hrs) due to surgical blood loss or risk of bleeding: no

## 2014-05-23 NOTE — ED Notes (Signed)
Ortho at bedside to splint right leg

## 2014-05-23 NOTE — Anesthesia Procedure Notes (Signed)
Procedure Name: Intubation Date/Time: 05/23/2014 5:31 PM Performed by: Angelica PouSMITH, Rogan Ecklund PIZZICARA Pre-anesthesia Checklist: Patient identified, Patient being monitored, Emergency Drugs available, Timeout performed and Suction available Patient Re-evaluated:Patient Re-evaluated prior to inductionOxygen Delivery Method: Circle system utilized Preoxygenation: Pre-oxygenation with 100% oxygen Intubation Type: IV induction, Rapid sequence and Cricoid Pressure applied Laryngoscope Size: Mac and 3 Grade View: Grade I Tube type: Oral Tube size: 7.5 mm Number of attempts: 1 Airway Equipment and Method: Stylet Placement Confirmation: ETT inserted through vocal cords under direct vision,  breath sounds checked- equal and bilateral and positive ETCO2 Secured at: 21 cm Tube secured with: Tape Dental Injury: Teeth and Oropharynx as per pre-operative assessment

## 2014-05-23 NOTE — Anesthesia Preprocedure Evaluation (Addendum)
Anesthesia Evaluation  Patient identified by MRN, date of birth, ID band Patient awake    Reviewed: Allergy & Precautions, H&P , NPO status , Patient's Chart, lab work & pertinent test results  Airway Mallampati: II  TM Distance: >3 FB Neck ROM: Full    Dental no notable dental hx. (+) Teeth Intact, Dental Advisory Given   Pulmonary neg pulmonary ROS,  breath sounds clear to auscultation  Pulmonary exam normal       Cardiovascular negative cardio ROS  Rhythm:Regular Rate:Normal     Neuro/Psych negative neurological ROS  negative psych ROS   GI/Hepatic negative GI ROS, Neg liver ROS,   Endo/Other  negative endocrine ROS  Renal/GU negative Renal ROS  negative genitourinary   Musculoskeletal   Abdominal   Peds  Hematology negative hematology ROS (+)   Anesthesia Other Findings   Reproductive/Obstetrics negative OB ROS                            Anesthesia Physical Anesthesia Plan  ASA: II  Anesthesia Plan: General   Post-op Pain Management:    Induction: Intravenous, Rapid sequence and Cricoid pressure planned  Airway Management Planned: Oral ETT  Additional Equipment:   Intra-op Plan:   Post-operative Plan: Extubation in OR  Informed Consent: I have reviewed the patients History and Physical, chart, labs and discussed the procedure including the risks, benefits and alternatives for the proposed anesthesia with the patient or authorized representative who has indicated his/her understanding and acceptance.   Dental advisory given  Plan Discussed with: CRNA  Anesthesia Plan Comments:         Anesthesia Quick Evaluation

## 2014-05-23 NOTE — ED Notes (Signed)
Pt transported to xray 

## 2014-05-23 NOTE — H&P (Signed)
PREOPERATIVE H&P  Chief Complaint: Right leg pain  HPI: Sharon Owen is a 49 y.o. female who presents for evaluation of Right leg pain status post trauma today.  She's been evaluated emergency room and noted to have an open fracture of the distal tibia.. It has been present for Several hours and has been worsening.We had a long discussion of treatment options but feel that irrigation debridement followed by intramedullary rodding is a most prepped course of action and she'll be taken operating room for this procedure.  Past Medical History  Diagnosis Date  . No pertinent past medical history    Past Surgical History  Procedure Laterality Date  . No past surgeries    . Chondroplasty  12/24/2011    Procedure: CHONDROPLASTY;  Surgeon: Vickki HearingStanley E Harrison, MD;  Location: [facility];  Service: Orthopedics;  Laterality: Right;   History   Social History  . Marital Status: Single    Spouse Name: N/A    Number of Children: N/A  . Years of Education: N/A   Social History Main Topics  . Smoking status: Never Smoker   . Smokeless tobacco: None  . Alcohol Use: Yes     Comment: occ  . Drug Use: No  . Sexual Activity: None   Other Topics Concern  . None   Social History Narrative   No family history on file. Allergies  Allergen Reactions  . Adhesive [Tape] Itching  . Hydrocodone Itching  . Latex Itching  . Gabapentin Rash  . Morphine And Related Rash   Prior to Admission medications   Medication Sig Start Date End Date Taking? Authorizing Provider  diclofenac sodium (VOLTAREN) 1 % GEL Apply 4 g topically 4 (four) times daily. Patient not taking: Reported on 05/23/2014 09/07/12   Vickki HearingStanley E Harrison, MD  diphenhydrAMINE (BENADRYL) 25 mg capsule Take 25 mg by mouth every 6 (six) hours as needed. 12/24/11   Vickki HearingStanley E Harrison, MD  diphenhydrAMINE (BENADRYL) 25 MG tablet Take 25 mg by mouth every 6 (six) hours as needed. Itching    Historical Provider, MD  esomeprazole (NEXIUM) 20 MG  capsule Take 20 mg by mouth daily at 12 noon.   Yes Historical Provider, MD  gabapentin (NEURONTIN) 100 MG capsule Take 100-300 mg by mouth daily as needed. Pain, may use up to three tablets a day.    Historical Provider, MD  promethazine (PHENERGAN) 12.5 MG tablet Take 12.5 mg by mouth every 6 (six) hours as needed. 12/24/11   Vickki HearingStanley E Harrison, MD  zolpidem (AMBIEN) 10 MG tablet Take 1 tablet (10 mg total) by mouth at bedtime as needed for sleep. Patient not taking: Reported on 05/23/2014 01/27/12 02/26/12  Vickki HearingStanley E Harrison, MD  zolpidem (AMBIEN) 10 MG tablet Take 10 mg by mouth at bedtime as needed for sleep.   Yes Historical Provider, MD     Positive ROS: none  All other systems have been reviewed and were otherwise negative with the exception of those mentioned in the HPI and as above.  Physical Exam: Filed Vitals:   05/23/14 1300  BP: 149/76  Pulse: 73  Temp:   Resp: 14    General: Alert, no acute distress Cardiovascular: No pedal edema Respiratory: No cyanosis, no use of accessory musculature GI: No organomegaly, abdomen is soft and non-tender Skin: No lesions in the area of chief complaint Neurologic: Sensation intact distally Psychiatric: Patient is competent for consent with normal mood and affect Lymphatic: No axillary or cervical lymphadenopathy  MUSCULOSKELETAL: Right  leg has obvious angular deformity.  She is distally neuro vascularly intact intact distally.  There's a 1 cm laceration laterally which may communicate with the fracture.  Assessment/Plan: 49 year old female with a grade 1 open fracture to the right distal tib-fib.//The patient will need irrigation debridement of the fracture fragments with intramedullary rodding for fixation of the tibia fracture.  There's a clear understanding that the patient could have issues arise from intramedullary nailing with the risks outweigh the benefits.  The risks benefits and alternatives were discussed with the patient  including but not limited to the risks of nonoperative treatment, versus surgical intervention including infection, bleeding, nerve injury, malunion, nonunion, hardware prominence, hardware failure, need for hardware removal, blood clots, cardiopulmonary complications, morbidity, mortality, among others, and they were willing to proceed.  Predicted outcome is good, although there will be at least a six to nine month expected recovery.  Lequan Dobratz L, MD 05/23/2014 2:07 PM

## 2014-05-23 NOTE — ED Notes (Signed)
X-ray called for update on pt transport to x-ray.

## 2014-05-23 NOTE — Anesthesia Postprocedure Evaluation (Signed)
  Anesthesia Post-op Note  Patient: Sharon Owen  Procedure(s) Performed: Procedure(s): INTRAMEDULLARY (IM) NAIL RIGHT TIBIA (Right)  Patient Location: PACU  Anesthesia Type:General  Level of Consciousness: awake, alert , oriented and patient cooperative  Airway and Oxygen Therapy: Patient Spontanous Breathing  Post-op Pain: moderate  Post-op Assessment: Post-op Vital signs reviewed, Patient's Cardiovascular Status Stable, Respiratory Function Stable, Patent Airway, No signs of Nausea or vomiting and Pain level controlled  Post-op Vital Signs: stable  Last Vitals:  Filed Vitals:   05/23/14 1915  BP: 156/80  Pulse: 78  Temp: 36.5 C  Resp: 17    Complications: No apparent anesthesia complications

## 2014-05-23 NOTE — Transfer of Care (Signed)
Immediate Anesthesia Transfer of Care Note  Patient: Sharon Owen  Procedure(s) Performed: Procedure(s): INTRAMEDULLARY (IM) NAIL RIGHT TIBIA (Right)  Patient Location: PACU  Anesthesia Type:General  Level of Consciousness: awake, alert , oriented and patient cooperative  Airway & Oxygen Therapy: Patient Spontanous Breathing and Patient connected to nasal cannula oxygen  Post-op Assessment: Report given to RN and Post -op Vital signs reviewed and stable  Post vital signs: Reviewed and stable  Last Vitals:  Filed Vitals:   05/23/14 1915  BP:   Pulse: 78  Temp:   Resp: 17    Complications: No apparent anesthesia complications

## 2014-05-23 NOTE — ED Notes (Signed)
Pt via [facility] for fall down stairs today, pt denies any LOC denies any dizziness before fall just reports slipping. Pt noted to have deformity to right leg, given 5mg  of morphine en route and 25 benadryl en route. Given 100 cc en route of normal saline. NAD noted. Gcs 15

## 2014-05-23 NOTE — ED Notes (Signed)
Pt returned from x-ray, placed back on heart monitor.  

## 2014-05-23 NOTE — ED Provider Notes (Addendum)
CSN: [id number]     Arrival date & time 05/23/14  1125 History   First MD Initiated Contact with Patient 05/23/14 1132     Chief Complaint  Patient presents with  . Fall  . level 2      (Consider location/radiation/quality/duration/timing/severity/associated sxs/prior Treatment) HPI Comments: Patient presents to the ER for evaluation of right leg injury. Patient reports that she was rushing to get out of the house, tripped and fell on steps. She twisted and landed on her right leg. No loss of consciousness. She did not hit her head. She denies neck pain, back pain, chest pain, abdominal pain. Patient brought to the ER by EMS. They report obvious deformity of the lower tibia-fibula region.  Patient was administered morphine by EMS. Patient developed itching and hives at the IV site. She was given Benadryl 25 mg.  Patient is a 49 y.o. female presenting with fall.  Fall    Past Medical History  Diagnosis Date  . No pertinent past medical history    Past Surgical History  Procedure Laterality Date  . No past surgeries    . Chondroplasty  12/24/2011    Procedure: CHONDROPLASTY;  Surgeon: Vickki Hearing, MD;  Location: [facility];  Service: Orthopedics;  Laterality: Right;   No family history on file. History  Substance Use Topics  . Smoking status: Never Smoker   . Smokeless tobacco: Not on file  . Alcohol Use: Yes     Comment: occ   OB History    No data available     Review of Systems  Musculoskeletal:       Leg pain  All other systems reviewed and are negative.     Allergies  Adhesive; Hydrocodone; Latex; Gabapentin; and Morphine and related  Home Medications   Prior to Admission medications   Medication Sig Start Date End Date Taking? Authorizing Provider  diclofenac sodium (VOLTAREN) 1 % GEL Apply 4 g topically 4 (four) times daily. Patient not taking: Reported on 05/23/2014 09/07/12   Vickki Hearing, MD  diphenhydrAMINE (BENADRYL) 25 mg capsule Take 25 mg  by mouth every 6 (six) hours as needed. 12/24/11   Vickki Hearing, MD  diphenhydrAMINE (BENADRYL) 25 MG tablet Take 25 mg by mouth every 6 (six) hours as needed. Itching    Historical Provider, MD  esomeprazole (NEXIUM) 20 MG capsule Take 20 mg by mouth daily at 12 noon.   Yes Historical Provider, MD  gabapentin (NEURONTIN) 100 MG capsule Take 100-300 mg by mouth daily as needed. Pain, may use up to three tablets a day.    Historical Provider, MD  promethazine (PHENERGAN) 12.5 MG tablet Take 12.5 mg by mouth every 6 (six) hours as needed. 12/24/11   Vickki Hearing, MD  zolpidem (AMBIEN) 10 MG tablet Take 1 tablet (10 mg total) by mouth at bedtime as needed for sleep. Patient not taking: Reported on 05/23/2014 01/27/12 02/26/12  Vickki Hearing, MD  zolpidem (AMBIEN) 10 MG tablet Take 10 mg by mouth at bedtime as needed for sleep.   Yes Historical Provider, MD   BP 149/76 mmHg  Pulse 73  Temp(Src) 98.4 F (36.9 C) (Oral)  Resp 14  Ht  (1.626 m)  Wt 220 lb (99.791 kg)  BMI 37.74 kg/m2  SpO2 100% Physical Exam  Constitutional: She is oriented to person, place, and time. She appears well-developed and well-nourished. No distress.  HENT:  Head: Normocephalic and atraumatic.  Right Ear: Hearing normal.  Left  Ear: Hearing normal.  Nose: Nose normal.  Mouth/Throat: Oropharynx is clear and moist and mucous membranes are normal.  Eyes: Conjunctivae and EOM are normal. Pupils are equal, round, and reactive to light.  Neck: Normal range of motion. Neck supple.  Cardiovascular: Regular rhythm, S1 normal and S2 normal.  Exam reveals no gallop and no friction rub.   No murmur heard. Pulmonary/Chest: Effort normal and breath sounds normal. No respiratory distress. She exhibits no tenderness.  Abdominal: Soft. Normal appearance and bowel sounds are normal. There is no hepatosplenomegaly. There is no tenderness. There is no rebound, no guarding, no tenderness at McBurney's point and negative  Murphy's sign. No hernia.  Musculoskeletal: Normal range of motion.       Right lower leg: She exhibits tenderness, bony tenderness, deformity and laceration.  Neurological: She is alert and oriented to person, place, and time. She has normal strength. No cranial nerve deficit or sensory deficit. Coordination normal. GCS eye subscore is 4. GCS verbal subscore is 5. GCS motor subscore is 6.  Skin: Skin is warm and dry. Laceration noted. No rash noted. No cyanosis.     Psychiatric: She has a normal mood and affect. Her speech is normal and behavior is normal. Thought content normal.  Nursing note and vitals reviewed.      ED Course  Procedures (including critical care time) Labs Review Labs Reviewed  CBC WITH DIFFERENTIAL/PLATELET  BASIC METABOLIC PANEL  PROTIME-INR    Imaging Review Dg Chest 1 View  05/23/2014   CLINICAL DATA:  Status post fall today.  Initial encounter.  EXAM: CHEST - 1 VIEW  COMPARISON:  None.  FINDINGS: Heart size and mediastinal contours are within normal limits. Both lungs are clear. Visualized skeletal structures are unremarkable.  IMPRESSION: Negative exam.   Electronically Signed   By: Drusilla Kannerhomas  Dalessio M.D.   On: 05/23/2014 13:42   Dg Pelvis 1-2 Views  05/23/2014   CLINICAL DATA:  Status post fall today. Right lower extremity pain. Initial encounter.  EXAM: PELVIS - 1-2 VIEW  COMPARISON:  None.  FINDINGS: There is no acute bony or joint abnormality. Joint spaces are preserved about the hips. IUD is noted.  IMPRESSION: Negative exam.   Electronically Signed   By: Drusilla Kannerhomas  Dalessio M.D.   On: 05/23/2014 13:43   Dg Ankle Complete Left  05/23/2014   CLINICAL DATA:  Left ankle pain after fall.  Initial encounter.  EXAM: LEFT ANKLE COMPLETE - 3+ VIEW  COMPARISON:  None.  FINDINGS: There is no evidence of fracture, dislocation, or joint effusion. Mild spurring of posterior calcaneus is noted. Osteophyte formation is noted medially in the sub talar joint. Soft tissue  swelling is seen over lateral malleolus suggesting ligamentous injury.  IMPRESSION: No fracture or dislocation is noted. Soft tissue swelling is noted over lateral malleolus suggesting ligamentous injury.   Electronically Signed   By: Roque LiasJames  Green M.D.   On: 05/23/2014 13:47   Dg Tibia/fibula Right Port  05/23/2014   CLINICAL DATA:  Pain following fall  EXAM: PORTABLE RIGHT TIBIA AND FIBULA - 2 VIEW  COMPARISON:  None.  FINDINGS: Frontal and lateral views were obtained. Portions of the proximal tibia and fibular are not visualized on this examination. There is a comminuted fracture of the distal tibia with lateral angulation of the distal major fracture fragment with respect to the proximal major fragment. There is a bony fragment displaced slightly lateral to the remainder the tibia. There is a comminuted fracture of the distal  fibula with approximately 2 cm of overriding of fracture fragments. There is lateral angulation of the distal fracture fragment with respect proximal fragment. There is a small calcification in the medial malleolar region which may represent an avulsion type injury in this region. No ankle joint dislocation. There is a prominent inferior calcaneal spur.  IMPRESSION: Comminuted distal tibia and fibula fractures with lateral angulation of distal major fracture fragments compared to proximal major fracture fragments. The knee and proximal tibia/fibula are not visualized on this current examination. When patient is able, additional imaging to evaluate these areas is warranted. Probable small avulsion arising from the medial malleolus. Ankle mortise appears intact.   Electronically Signed   By: Bretta Bang M.D.   On: 05/23/2014 11:59     EKG Interpretation   Date/Time:  Thursday May 23 2014 11:38:13 EST Ventricular Rate:  76 PR Interval:  175 QRS Duration: 82 QT Interval:  384 QTC Calculation: 432 R Axis:   62 Text Interpretation:  Sinus rhythm Sinus rhythm Low voltage,  precordial  leads No previous tracing Confirmed by POLLINA  MD, CHRISTOPHER [phone]) on  05/23/2014 11:48:27 AM      MDM   Final diagnoses:  Fall   distal tibia and fibula fracture  Patient presents to the ER for evaluation of right lower extremity injury after a fall. Patient slipped on stairs and twisted her lower leg. She arrives with an obvious deformity of the distal tibia and fibula. There is a small skin defect anteriorly concerning for open fracture. X-ray does confirm comminuted fracture of the tibia and fibula. Patient has palpable distal pulses. She is neurovascularly intact.  Patient did start to complain of pain in the left ankle as well after her arrival here. X-ray of this area did not show any acute injury. X-ray of pelvis performed because of mechanism of fall, no injury noted. She does not have any tenderness or pain over the lumbosacral or thoracic spine. Likewise neck is non-tender, no neck pain with range of motion. She did not hit her head. There is no mechanism for head or neck injury.  Addendum: Discussed with Dr. Luiz Blare. Patient will go to the OR this afternoon. She'll be kept nothing by mouth. Will await call to the OR.  Gilda Crease, MD 05/23/14 1411  Gilda Crease, MD 05/23/14 1415

## 2014-05-24 ENCOUNTER — Encounter (HOSPITAL_COMMUNITY): Payer: Self-pay | Admitting: Orthopedic Surgery

## 2014-05-24 DIAGNOSIS — S93402A Sprain of unspecified ligament of left ankle, initial encounter: Secondary | ICD-10-CM | POA: Diagnosis present

## 2014-05-24 MED ORDER — DOCUSATE SODIUM 100 MG PO CAPS
100.0000 mg | ORAL_CAPSULE | Freq: Two times a day (BID) | ORAL | Status: DC
  Administered 2014-05-24 – 2014-05-25 (×3): 100 mg via ORAL
  Filled 2014-05-24 (×3): qty 1

## 2014-05-24 MED ORDER — KETOROLAC TROMETHAMINE 15 MG/ML IJ SOLN
15.0000 mg | Freq: Four times a day (QID) | INTRAMUSCULAR | Status: AC
Start: 2014-05-25 — End: 2014-05-25
  Administered 2014-05-25 (×2): 15 mg via INTRAVENOUS
  Filled 2014-05-24 (×2): qty 1

## 2014-05-24 MED ORDER — MENTHOL 3 MG MT LOZG: 1.0000 | LOZENGE | OROMUCOSAL | Status: DC | PRN

## 2014-05-24 MED ORDER — FLUCONAZOLE 150 MG PO TABS: 150.0000 mg | ORAL_TABLET | Freq: Every day | ORAL | Status: DC

## 2014-05-24 NOTE — Progress Notes (Signed)
Orthopedic Tech Progress Note Patient Details:  Sharon BlackbirdCassandra F Owen 04/19/1966 [id number]  Ortho Devices Type of Ortho Device: ASO Ortho Device/Splint Location: lle Ortho Device/Splint Interventions: Application   Rusti Arizmendi 05/24/2014, 12:30 PM

## 2014-05-24 NOTE — Progress Notes (Signed)
UR Completed.  [phone]  

## 2014-05-24 NOTE — Progress Notes (Signed)
Subjective: 1 Day Post-Op Procedure(s) (LRB): INTRAMEDULLARY (IM) NAIL RIGHT TIBIA (Right) Patient reports pain as moderate.  Not out of bed yet, but getting ready to get up with physical therapy. Complains of pain in her left ankle. Her x-rays were negative. Complains of right lower leg discomfort. No chest pain or shortness of breath. Taking by mouth and voiding okay.  Objective: Vital signs in last 24 hours: Temp:  [97.7 F (36.5 C)-98.3 F (36.8 C)] 98.3 F (36.8 C) (01/29 0516) Pulse Rate:  [59-97] 67 (01/29 0516) Resp:  [12-21] 16 (01/29 0516) BP: (103-189)/(34-83) 103/34 mmHg (01/29 0516) SpO2:  [91 %-100 %] 96 % (01/29 0516) Weight:  [99.791 kg (220 lb)] 99.791 kg (220 lb) (01/28 1145)  Intake/Output from previous day: 01/28 0701 - 01/29 0700 In: 2480 [P.O.:480; I.V.:1900; IV Piggyback:100] Out: 705 [Urine:675; Blood:30] Intake/Output this shift:     Recent Labs  05/23/14 1134  HGB 14.0    Recent Labs  05/23/14 1134  WBC 9.9  RBC 4.78  HCT 42.8  PLT 242    Recent Labs  05/23/14 1134  NA 140  K 4.0  CL 107  CO2 21  BUN 11  CREATININE 0.75  GLUCOSE 88  CALCIUM 8.9    Recent Labs  05/23/14 1134  INR 0.99   left ankle exam: Tenderness and swelling laterally. No ligamentous instability. Right lower extremity exam: Dressing benign. Posterior splint intact. Moves toes actively. Normal sensation in toes. Good capillary refill.    Assessment/Plan: 1 Day Post-Op Procedure(s) (LRB): INTRAMEDULLARY (IM) NAIL RIGHT TIBIA (Right) Left ankle sprain. Plan: Up with physical therapy nonweightbearing on right. Aspirin/SCDs for DVT prophylaxis. Continue IV antibiotics 48 hours. Will probably discharge home tomorrow afternoon. She will need a wheelchair at home. Lace up ankle brace for her left ankle. Follow-up with Dr. Luiz BlareGraves in 2 weeks.   Jayleena Stille G 05/24/2014, 11:36 AM

## 2014-05-24 NOTE — Progress Notes (Signed)
PT Cancellation Note  Patient Details Name: Vic BlackbirdCassandra F Ranieri MRN: [id number] DOB: 07/22/1965   Cancelled Treatment:    Reason Eval/Treat Not Completed: Pain limiting ability to participate.  Pt would like pain meds prior to working with PT. RN notified.  PT to check back later this AM or PM.  Thanks,  Lurena Joinerebecca B. Sukhman Martine, PT, DPT [phone]   05/24/2014, 9:32 AM

## 2014-05-24 NOTE — Op Note (Signed)
NAMEKHANIYA, TENAGLIA NO.:  [id number]  MEDICAL RECORD NO.:  [id number]  LOCATION:  5N23C                        FACILITY:  [facility]  PHYSICIAN:  Harvie Junior, M.D.   DATE OF BIRTH:  15-Jan-1966  DATE OF PROCEDURE: DATE OF DISCHARGE:                              OPERATIVE REPORT   PREOPERATIVE DIAGNOSIS:  Comminuted grade 1 open tib-fib fracture, right.  POSTOPERATIVE DIAGNOSIS:  Comminuted grade 1 open tib-fib fracture, right.  PROCEDURES: 1. Open reduction and internal fixation of right grade 1 open tibia     fracture with a 9 x 36 mm tibial nail locked proximally and     distally. 2. Debridement of skin, subcutaneous tissue, muscle, fascia, and bone     at the site of an open fracture with the use of scalpel forceps and     Scissors. 3. Interpretation of multiple intraoperative fluoroscopic images  BRIEF HISTORY:  Mrs. Ramey is a 49 year old female who fell down the stairs.  She suffered a grade 1 open tibia fracture.  She was admitted to the emergency room and we were consulted.  X-ray showed comminuted tib-fib fracture.  Because of the open nature of the fracture, we felt she needed urgent debridement and because of the fracture pattern, we felt she needed intramedullary rodding.  She was taken to the operating room for this procedure.  DESCRIPTION OF PROCEDURE:  The patient was taken to the operating room and after adequate level of anesthesia was obtained with general anesthetic, the patient was placed supine on the operating table.  The right leg was then prepped and draped in usual sterile fashion. Following this, a small elliptical incision was made from the grade 1 open wound and this tissue was discarded.  We then used an irrigator to irrigate out the fracture site.  We then debrided the fascia, muscle, and bone fragments at this site, and irrigated again thoroughly and then closed this area with staples.  Following this, attention was turned  to the medial portion of the patella where an incision was made to the subcutaneous tissue down the level of the patellar tendon.  Medial to the patellar tendon, we made an opening and then put a guidewire down the central portion of the tibia.  This was over-reamed with an introductory reamer and a guidewire was then passed across the fracture site with being held in anatomically reduced position.  Following this, the fracture was sequentially reamed to a level of 10.5 mm and a 36 x 9 mm rod was placed down the fracture site and anatomically reduced.  It was locked proximally with 2 oblique screws through small stab incisions, distally with 3 screws given the distal nature of the fracture.  I felt this would give [place] better fixation of the distal piece.  Anatomic reduction was achieved.  All screw lengths were checked under fluoro and the fracture reduction checked under fluoro.  Near anatomic reduction had been achieved.  At this point, the wounds were irrigated, suctioned dry, closed in layers.  Sterile compressive dressing was applied and the patient was taken to the recovery room where she was noted to be in satisfactory condition.  Estimated blood loss for  the procedure was minimal.     Harvie JuniorJohn L. Horton Ellithorpe, M.D.     Ranae PlumberJLG/MEDQ  D:  05/23/2014  T:  05/24/2014  Job:  [id number]

## 2014-05-24 NOTE — Progress Notes (Signed)
05/24/14 Spoke with patent about [facility]. She selected [facility].Contacted Miranda and set up HHPT. Contacted Frank with [facility]  and requested wheelchair, rolling walker and 3N1 be delivered to patient's room.Patient will have family to assist after d/c.

## 2014-05-24 NOTE — Evaluation (Signed)
Physical Therapy Evaluation Patient Details Name: Sharon Owen MRN: [id number] DOB: 01-24-1966 Today's Date: 05/24/2014   History of Present Illness  49 y.o. female admitted to [facility] on 05/23/14 after fall with R tib/fib fx s/p ORIF.  Pt is now NWB on her right leg.  Pt also sustained left ankle sprain.  Pt with significant PMHx of chondroplasty right (knee?).    Clinical Impression  Pt is able to get up OOB to chair, but does need assist for the transfer to be completed safely.  She would benefit from [facility] services as she has limited assistance at home during the day.  Per pt, she can go in through the basement and there are no stairs there, however, she has a flight to get up to her bedroom and bathroom.  I discussed using the 1/2 bathroom for a while and doing sponge baths and using the BSC until she is strong enough to get up the stairs with assistance and NWB on her right leg (she may end up having to scoot up the stairs and have someone to help her stand at the top).  PT will continue to follow acutely until d/c.  Please review WC parts and management tomorrow as well as either stand pivot or squat/scoot pivot if this seems safer to WC.      Follow Up Recommendations Home health PT;Supervision for mobility/OOB    Equipment Recommendations  Rolling walker with 5" wheels;Wheelchair (measurements PT);Wheelchair cushion (measurements PT);3in1 (PT);Other (comment) (WC with elevating leg rests )    Recommendations for Other Services   NA    Precautions / Restrictions Precautions Precautions: Fall Precaution Comments: due to NWB status and sore left ankle Restrictions RLE Weight Bearing: Non weight bearing      Mobility  Bed Mobility Overal bed mobility: Needs Assistance Bed Mobility: Supine to Sit     Supine to sit: Min guard     General bed mobility comments: Min guard assist to help with right leg.   Transfers Overall transfer level: Needs assistance Equipment used:  Rolling walker (2 wheeled) Transfers: Sit to/from [employer] Sit to Stand: Mod assist Stand pivot transfers: Min assist       General transfer comment: Mod assist with heavy reliance on upper extremity support for transitions.  Pt needed assist at trunk and to stabilize RW to prevent tipping.  Pt able to hop pivot 5-6 hop steps to the recliner and maintain NWB of her right foot.  Arms to control descent to sit and still with uncontrolled descent.   Ambulation/Gait             General Gait Details: Unable due to poor upper extremity endurance.          Balance Overall balance assessment: Needs assistance Sitting-balance support: Feet supported;No upper extremity supported Sitting balance-Leahy Scale: Good     Standing balance support: Bilateral upper extremity supported Standing balance-Leahy Scale: Poor                               Pertinent Vitals/Pain Pain Assessment: 0-10 Pain Score: 5  Pain Location: right foot Pain Descriptors / Indicators: Aching;Burning Pain Intervention(s): Limited activity within patient's tolerance;Monitored during session;Premedicated before session;Repositioned    Home Living Family/patient expects to be discharged to:: Private residence Living Arrangements: Spouse/significant other;Children (older children and 1 y.o. grandson) Available Help at Discharge: Family;Available PRN/intermittently (daughters are in school, husband's mom is in ICU  and dying) Type of Home: House Home Access: Level entry (if she goes through the basement)     Home Layout: Two level;1/2 bath on main level Home Equipment: None Additional Comments: Pt works as an Engineer, building servicesN    Prior Function Level of Independence: Independent               Extremity/Trunk Assessment   Upper Extremity Assessment: Overall WFL for tasks assessed           Lower Extremity Assessment: RLE deficits/detail;LLE deficits/detail RLE Deficits /  Details: right leg with normal post op pain and weakness.  Pt can wiggle her toes and can feel her toes.  She is able to with both hands (active assistive) lift leg to EOB.  Knee 3/5, hip 3-/5 LLE Deficits / Details: left ankle is sore and swollen.  X-rays negative for fx. Ice applied to ankle after transfer.  Pt would like a brace.  I spoke with MD re: brace for left ankle.  Otherwise, left leg WNL.   Cervical / Trunk Assessment: Normal  Communication   Communication: No difficulties  Cognition Arousal/Alertness: Awake/alert Behavior During Therapy: WFL for tasks assessed/performed Overall Cognitive Status: Within Functional Limits for tasks assessed                               Assessment/Plan    PT Assessment Patient needs continued PT services  PT Diagnosis Difficulty walking;Abnormality of gait;Generalized weakness;Acute pain   PT Problem List Decreased strength;Decreased activity tolerance;Decreased balance;Decreased mobility;Decreased knowledge of use of DME;Obesity;Pain  PT Treatment Interventions DME instruction;Gait training;Stair training;Functional mobility training;Therapeutic activities;Therapeutic exercise;Balance training;Neuromuscular re-education;Patient/family education;Wheelchair mobility training;Modalities   PT Goals (Current goals can be found in the Care Plan section) Acute Rehab PT Goals Patient Stated Goal: to go home PT Goal Formulation: With patient Time For Goal Achievement: 05/31/14 Potential to Achieve Goals: Good    Frequency Min 5X/week   Barriers to discharge Inaccessible home environment;Decreased caregiver support flight of stairs to get up to her bedroom and full bath, intermittent family assist.        End of Session   Activity Tolerance: Patient limited by pain Patient left: in chair;with call bell/phone within reach Nurse Communication: Mobility status       Time: [id number] PT Time Calculation (min) (ACUTE ONLY): 22  min   Charges:   PT Evaluation $Initial PT Evaluation Tier I: 1 Procedure PT Treatments $Therapeutic Activity: 8-22 mins        Zainab Crumrine B. Hellen Shanley, PT, DPT [phone]   05/24/2014, 4:03 PM

## 2014-05-25 MED ORDER — HYDROMORPHONE HCL 1 MG/ML IJ SOLN
1.0000 mg | Freq: Once | INTRAMUSCULAR | Status: AC
  Administered 2014-05-25: 1 mg via INTRAVENOUS
  Filled 2014-05-25: qty 1

## 2014-05-25 MED ORDER — HYDROMORPHONE HCL 1 MG/ML IJ SOLN
1.0000 mg | INTRAMUSCULAR | Status: DC | PRN
  Administered 2014-05-25 (×2): 1 mg via INTRAVENOUS
  Filled 2014-05-25: qty 1
  Filled 2014-05-25: qty 2
  Filled 2014-05-25: qty 1

## 2014-05-25 MED ORDER — HYDROMORPHONE HCL 2 MG PO TABS: 2.0000 mg | ORAL_TABLET | ORAL | Status: DC | PRN

## 2014-05-25 NOTE — Discharge Summary (Signed)
Patient ID: Vic BlackbirdCassandra F Weimer MRN: [id number] DOB/AGE: 49/12/1965 49 y.o.  Admit date: 05/23/2014 Discharge date: 05/25/2014  Admission Diagnoses:  Principal Problem:   Open fracture of right tibia and fibula Active Problems:   Left ankle sprain   Discharge Diagnoses:  Same  Past Medical History  Diagnosis Date  . No pertinent past medical history     Surgeries: Procedure(s): INTRAMEDULLARY (IM) NAIL RIGHT TIBIA on 05/23/2014   Consultants:    Discharged Condition: Improved  Hospital Course: Vic BlackbirdCassandra F Petrides is an 49 y.o. female who was admitted 05/23/2014 for operative treatment ofOpen fracture of right tibia and fibula. Patient has severe unremitting pain that affects sleep, daily activities, and work/hobbies. After pre-op clearance the patient was taken to the operating room on 05/23/2014 and underwent  Procedure(s): INTRAMEDULLARY (IM) NAIL RIGHT TIBIA.    Patient was given perioperative antibiotics: Anti-infectives    Start     Dose/Rate Route Frequency Ordered Stop   05/24/14 0600  ceFAZolin (ANCEF) IVPB 2 g/50 mL premix     2 g100 mL/hr over 30 Minutes Intravenous On call to O.R. 05/23/14 1712 05/23/14 1739   05/24/14 0000  fluconazole (DIFLUCAN) 150 MG tablet     150 mg Oral Daily 05/24/14 1114     05/23/14 2100  ceFAZolin (ANCEF) IVPB 2 g/50 mL premix     2 g100 mL/hr over 30 Minutes Intravenous Every 6 hours 05/23/14 2058 05/25/14 0455   05/23/14 1345  ceFAZolin (ANCEF) IVPB 2 g/50 mL premix     2 g100 mL/hr over 30 Minutes Intravenous  Once 05/23/14 1345 05/23/14 1447   05/23/14 0000  cephALEXin (KEFLEX) 500 MG capsule     500 mg Oral 3 times daily 05/23/14 1931         Patient was given sequential compression devices, early ambulation, and chemoprophylaxis to prevent DVT.  Patient benefited maximally from hospital stay and there were no complications.    Recent vital signs: Patient Vitals for the past 24 hrs:  BP Temp Temp src Pulse Resp SpO2  05/25/14  0528 (!) 104/47 mmHg 98.8 F (37.1 C) Oral 85 - 98 %  05/24/14 2039 (!) 104/33 mmHg 98.8 F (37.1 C) Oral 75 - 98 %  05/24/14 1707 (!) 102/43 mmHg 98.8 F (37.1 C) Oral 72 16 94 %     Recent laboratory studies:  Recent Labs  05/23/14 1134  WBC 9.9  HGB 14.0  HCT 42.8  PLT 242  NA 140  K 4.0  CL 107  CO2 21  BUN 11  CREATININE 0.75  GLUCOSE 88  INR 0.99  CALCIUM 8.9     Discharge Medications:     Medication List    TAKE these medications        aspirin EC 325 MG tablet  Take 1 tablet (325 mg total) by mouth daily. Take with food. To decrease risk of blood clots.     cephALEXin 500 MG capsule  Commonly known as:  KEFLEX  Take 1 capsule (500 mg total) by mouth 3 (three) times daily. Take until gone.     diclofenac sodium 1 % Gel  Commonly known as:  VOLTAREN  Apply 4 g topically 4 (four) times daily.     diphenhydrAMINE 25 mg capsule  Commonly known as:  BENADRYL  Take 25 mg by mouth every 6 (six) hours as needed.     diphenhydrAMINE 25 MG tablet  Commonly known as:  BENADRYL  Take 25 mg by mouth every 6 (  six) hours as needed. Itching     esomeprazole 20 MG capsule  Commonly known as:  NEXIUM  Take 20 mg by mouth daily at 12 noon.     fluconazole 150 MG tablet  Commonly known as:  DIFLUCAN  Take 1 tablet (150 mg total) by mouth daily. Use as needed.     gabapentin 100 MG capsule  Commonly known as:  NEURONTIN  Take 100-300 mg by mouth daily as needed. Pain, may use up to three tablets a day.     HYDROmorphone 2 MG tablet  Commonly known as:  DILAUDID  Take 1-2 tablets (2-4 mg total) by mouth every 4 (four) hours as needed for severe pain.     methocarbamol 500 MG tablet  Commonly known as:  ROBAXIN  Take 1 tablet (500 mg total) by mouth every 8 (eight) hours as needed for muscle spasms.     promethazine 12.5 MG tablet  Commonly known as:  PHENERGAN  Take 12.5 mg by mouth every 6 (six) hours as needed.     zolpidem 10 MG tablet  Commonly known  as:  AMBIEN  Take 1 tablet (10 mg total) by mouth at bedtime as needed for sleep.     zolpidem 10 MG tablet  Commonly known as:  AMBIEN  Take 10 mg by mouth at bedtime as needed for sleep.        Diagnostic Studies: Dg Chest 1 View  05/23/2014   CLINICAL DATA:  Status post fall today.  Initial encounter.  EXAM: CHEST - 1 VIEW  COMPARISON:  None.  FINDINGS: Heart size and mediastinal contours are within normal limits. Both lungs are clear. Visualized skeletal structures are unremarkable.  IMPRESSION: Negative exam.   Electronically Signed   By: Drusilla Kanner M.D.   On: 05/23/2014 13:42   Dg Pelvis 1-2 Views  05/23/2014   CLINICAL DATA:  Status post fall today. Right lower extremity pain. Initial encounter.  EXAM: PELVIS - 1-2 VIEW  COMPARISON:  None.  FINDINGS: There is no acute bony or joint abnormality. Joint spaces are preserved about the hips. IUD is noted.  IMPRESSION: Negative exam.   Electronically Signed   By: Drusilla Kanner M.D.   On: 05/23/2014 13:43   Dg Tibia/fibula Right  05/23/2014   CLINICAL DATA:  Right tibial fracture fixation.  EXAM: DG C-ARM 61-120 MIN; RIGHT TIBIA AND FIBULA - 2 VIEW  COMPARISON:  Radiographs 05/23/2014.  FLUOROSCOPY TIME:  C-arm fluoroscopic images were obtained intraoperatively and submitted for post operative interpretation. Please see the performing provider's procedural report for the fluoroscopy time utilized.  FINDINGS: Ten spot fluoroscopic images are submitted. These demonstrate the placement of a right tibial intramedullary nail secured by 2 proximal and 2 distal interlocking screws. There is near anatomic reduction of the comminuted fractures of the distal tibial and fibular fractures. There is a probable surgical sponge within the proximal field-of-view on image number 2, not seen on the subsequent images.  IMPRESSION: Intraoperative views during right tibial intramedullary nail fixation.   Electronically Signed   By: Roxy Horseman M.D.   On:  05/23/2014 19:37   Dg Ankle Complete Left  05/23/2014   CLINICAL DATA:  Left ankle pain after fall.  Initial encounter.  EXAM: LEFT ANKLE COMPLETE - 3+ VIEW  COMPARISON:  None.  FINDINGS: There is no evidence of fracture, dislocation, or joint effusion. Mild spurring of posterior calcaneus is noted. Osteophyte formation is noted medially in the sub talar joint. Soft tissue  swelling is seen over lateral malleolus suggesting ligamentous injury.  IMPRESSION: No fracture or dislocation is noted. Soft tissue swelling is noted over lateral malleolus suggesting ligamentous injury.   Electronically Signed   By: Roque Lias M.D.   On: 05/23/2014 13:47   Dg Knee Right Port  05/23/2014   CLINICAL DATA:  Pain following fall  EXAM: PORTABLE RIGHT KNEE - 1-2 VIEW  COMPARISON:  None.  FINDINGS: Frontal and lateral views were obtained. There is no fracture or dislocation in in the region. The distal femur as well as the proximal tibia and fibula appear intact on this study. No joint effusion. There is spurring medially with mild narrowing medially.  IMPRESSION: Osteoarthritic change medially.  No fracture or dislocation.   Electronically Signed   By: Bretta Bang M.D.   On: 05/23/2014 14:41   Dg Tibia/fibula Right Port  05/23/2014   CLINICAL DATA:  Pain following fall  EXAM: PORTABLE RIGHT TIBIA AND FIBULA - 2 VIEW  COMPARISON:  None.  FINDINGS: Frontal and lateral views were obtained. Portions of the proximal tibia and fibular are not visualized on this examination. There is a comminuted fracture of the distal tibia with lateral angulation of the distal major fracture fragment with respect to the proximal major fragment. There is a bony fragment displaced slightly lateral to the remainder the tibia. There is a comminuted fracture of the distal fibula with approximately 2 cm of overriding of fracture fragments. There is lateral angulation of the distal fracture fragment with respect proximal fragment. There is a  small calcification in the medial malleolar region which may represent an avulsion type injury in this region. No ankle joint dislocation. There is a prominent inferior calcaneal spur.  IMPRESSION: Comminuted distal tibia and fibula fractures with lateral angulation of distal major fracture fragments compared to proximal major fracture fragments. The knee and proximal tibia/fibula are not visualized on this current examination. When patient is able, additional imaging to evaluate these areas is warranted. Probable small avulsion arising from the medial malleolus. Ankle mortise appears intact.   Electronically Signed   By: Bretta Bang M.D.   On: 05/23/2014 11:59   Dg C-arm 1-60 Min  05/23/2014   CLINICAL DATA:  Right tibial fracture fixation.  EXAM: DG C-ARM 61-120 MIN; RIGHT TIBIA AND FIBULA - 2 VIEW  COMPARISON:  Radiographs 05/23/2014.  FLUOROSCOPY TIME:  C-arm fluoroscopic images were obtained intraoperatively and submitted for post operative interpretation. Please see the performing provider's procedural report for the fluoroscopy time utilized.  FINDINGS: Ten spot fluoroscopic images are submitted. These demonstrate the placement of a right tibial intramedullary nail secured by 2 proximal and 2 distal interlocking screws. There is near anatomic reduction of the comminuted fractures of the distal tibial and fibular fractures. There is a probable surgical sponge within the proximal field-of-view on image number 2, not seen on the subsequent images.  IMPRESSION: Intraoperative views during right tibial intramedullary nail fixation.   Electronically Signed   By: Roxy Horseman M.D.   On: 05/23/2014 19:37    Disposition: 01-Home or Self Care      Discharge Instructions    Call MD / Call 911    Complete by:  As directed   If you experience chest pain or shortness of breath, CALL 911 and be transported to the hospital emergency room.  If you develope a fever above 101 F, pus (white drainage) or  increased drainage or redness at the wound, or calf pain, call your surgeon's  office.     Constipation Prevention    Complete by:  As directed   Drink plenty of fluids.  Prune juice may be helpful.  You may use a stool softener, such as Colace (over the counter) 100 mg twice a day.  Use MiraLax (over the counter) for constipation as needed.     Diet - low sodium heart healthy    Complete by:  As directed      Increase activity slowly as tolerated    Complete by:  As directed      Non weight bearing    Complete by:  As directed   Laterality:  right  Extremity:  Lower           Follow-up Information    Follow up with GRAVES,JOHN L, MD. Schedule an appointment as soon as possible for a visit in 2 weeks.   Specialty:  Orthopedic Surgery   Contact information:   [address] [place] [address] [phone]       Follow up with [facility].   Why:  They will contact you to schedule home therpy visits.   Contact information:   [address] [place] [address] [phone]        Signed: Drema Halon 05/25/2014, 9:30 AM

## 2014-05-25 NOTE — Progress Notes (Signed)
Discharge instructions and prescriptions reviewed with patient. Questions and concerns denied at this time. IV's removed with no complications. VS stable. Patient bathed prior to dressing for d/c. Patient set up with [facility] for PT and has walker, and 3 in 1 delivered to home already. Wheelchair delivered to room. Patient discharged via wheelchair with all personal belongings, prescriptions, and discharge packet.

## 2014-05-25 NOTE — Progress Notes (Signed)
Physical Therapy Treatment Patient Details Name: Sharon Owen MRN: [id number] DOB: 06/14/1965 Today's Date: 05/25/2014    History of Present Illness 49 y.o. female admitted to [facility] on 05/23/14 after fall with R tib/fib fx s/p ORIF.  Pt is now NWB on her right leg.  Pt also sustained left ankle sprain.  Pt with significant PMHx of chondroplasty right (knee?).      PT Comments    Pt mobilizing at supervision to min guard level. Session focused on educating wheelchair management safety. 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 Reviewed stair management technique, encouraged "bump up " technique only with appropriate (A) available. Wheelchair fitted appropriately to pt. Pt hopeful to D/C home today.   Follow Up Recommendations  Home health PT;Supervision for mobility/OOB     Equipment Recommendations  Rolling walker with 5" wheels;Wheelchair (measurements PT);Wheelchair cushion (measurements PT);3in1 (PT);Other (comment)    Recommendations for Other Services       Precautions / Restrictions Precautions Precautions: Fall Precaution Comments: due to NWB status and sore left ankle Restrictions Weight Bearing Restrictions: Yes RLE Weight Bearing: Non weight bearing    Mobility  Bed Mobility Overal bed mobility: Needs Assistance Bed Mobility: Supine to Sit     Supine to sit: Min assist     General bed mobility comments: min (A) to control Rt LE off EOB and to ground  Transfers Overall transfer level: Needs assistance Equipment used: Rolling walker (2 wheeled) Transfers: Sit to/from Stand Sit to Stand: Min guard         General transfer comment: min guard to steady when transferring from multiple surfaces; pt transferring from bed, BSC, wheelchair; cues for safety and hand placement   Ambulation/Gait Ambulation/Gait assistance: Min guard Ambulation Distance (Feet): 30 Feet (to bathroom back to wheelchair ) Assistive device: Rolling walker (2  wheeled) Gait Pattern/deviations: Step-to pattern Gait velocity: decr due to NWB status and pain  Gait velocity interpretation: Below normal speed for age/gender General Gait Details: pt ambulating with step to gt with NWB status on Rt LE; min guard to steady; cues for RW safety and wheelchair management when transferring to/from; pt fatigues quickly due to incr WB through UEs    Stairs Stairs:  (discussed technique to "bump" up steps )          Merchant navy officer mobility: Yes Wheelchair propulsion: Both upper extremities Wheelchair parts: Supervision/cueing Distance: 20 Wheelchair Assistance Details (indicate cue type and reason): educated on wheelchair equipment and safe technique   Modified Rankin (Stroke Patients Only)       Balance Overall balance assessment: Needs assistance Sitting-balance support: Feet supported;No upper extremity supported Sitting balance-Leahy Scale: Good     Standing balance support: During functional activity;Bilateral upper extremity supported Standing balance-Leahy Scale: Poor Standing balance comment: relies on RW                     Cognition Arousal/Alertness: Awake/alert Behavior During Therapy: WFL for tasks assessed/performed Overall Cognitive Status: Within Functional Limits for tasks assessed                      Exercises      General Comments General comments (skin integrity, edema, etc.): measured wheelchair LE extension to ensure proper fitting; reviewed home mobility techniques for safe D/C       Pertinent Vitals/Pain Pain Assessment: 0-10 Pain Score: 5  Pain Location: Rt ankle  Pain Descriptors /  Indicators: Aching;Burning Pain Intervention(s): Monitored during session;Premedicated before session;Repositioned    Home Living                      Prior Function            PT Goals (current goals can now be found in the care plan section) Acute Rehab PT  Goals Patient Stated Goal: home today PT Goal Formulation: With patient Time For Goal Achievement: 05/31/14 Potential to Achieve Goals: Good Progress towards PT goals: Progressing toward goals    Frequency  Min 5X/week    PT Plan Current plan remains appropriate    Co-evaluation             End of Session Equipment Utilized During Treatment: Gait belt Activity Tolerance: Patient tolerated treatment well Patient left: in chair;with call bell/phone within reach     Time: 0905-0939 PT Time Calculation (min) (ACUTE ONLY): 34 min  Charges:  $Gait Training: 8-22 mins $Wheel Chair Management: 8-22 mins                    G Codes:      Donnamarie PoagWest, Isador Castille AlmondN, [place]PT  [phone] 05/25/2014, 10:56 AM

## 2014-05-26 NOTE — Care Management Note (Signed)
    Page 1 of 2   05/26/2014     4:00:37 PM CARE MANAGEMENT NOTE 05/26/2014  Patient:  Vic BlackbirdBROWN,Olla F   Account Number:  [id number]  Date Initiated:  05/24/2014  Documentation initiated by:  [facility] LLCKRIEG,MARY  Subjective/Objective Assessment:   rt tib/fib fracture, s/p IM nail     Action/Plan:   PT eval- recommended HHPT, wheelchair, rolling walker and 3N1   Anticipated DC Date:  05/25/2014   Anticipated DC Plan:  HOME W HOME HEALTH SERVICES      DC Planning Services  CM consult      [facility]  [facility]  DURABLE MEDICAL EQUIPMENT   Choice offered to / List presented to:  C-1 Patient   DME arranged  3-N-1  WALKER - ROLLING  WHEELCHAIR - MANUAL      DME agency  [facility]     HH arranged  HH-2 PT      [facility]   Status of service:  Completed, signed off Medicare Important Message given?   (If response is "NO", the following Medicare IM given date fields will be blank) Date Medicare IM given:   Medicare IM given by:   Date Additional Medicare IM given:   Additional Medicare IM given by:    Discharge Disposition:  HOME W HOME HEALTH SERVICES  Per UR Regulation:  Reviewed for med. necessity/level of care/duration of stay  If discussed at Long Length of Stay Meetings, dates discussed:    Comments:  05/26/14 08:15 Cm received call from pt stating her 3n1 is too small.  CM called [facility] rep, Stephanie [facility] Fayrene FearingJames to arrange for a drop down commode to be shipped to the pt's home.  Pt notified.  Freddy JakschSarah Kannon Baum, BSN, Caryl AdaM [phone].  05/24/14 Spoke with patent about HHC. She selected [facility].Contacted Miranda and set up HHPT. Contacted Frank with [facility]  and requested wheelchair, rolling walker and 3N1 be delivered to patient's room.Patient will have family to assist after d/c.

## 2015-03-11 ENCOUNTER — Ambulatory Visit (INDEPENDENT_AMBULATORY_CARE_PROVIDER_SITE_OTHER): Payer: Self-pay | Admitting: Family Medicine

## 2015-03-11 ENCOUNTER — Encounter: Payer: Self-pay | Admitting: Family Medicine

## 2015-03-11 ENCOUNTER — Other Ambulatory Visit: Payer: Self-pay

## 2015-03-11 DIAGNOSIS — G4700 Insomnia, unspecified: Secondary | ICD-10-CM

## 2015-03-11 DIAGNOSIS — S134XXD Sprain of ligaments of cervical spine, subsequent encounter: Secondary | ICD-10-CM

## 2015-03-11 DIAGNOSIS — K219 Gastro-esophageal reflux disease without esophagitis: Secondary | ICD-10-CM | POA: Insufficient documentation

## 2015-03-11 DIAGNOSIS — F419 Anxiety disorder, unspecified: Secondary | ICD-10-CM | POA: Insufficient documentation

## 2015-03-11 DIAGNOSIS — S139XXD Sprain of joints and ligaments of unspecified parts of neck, subsequent encounter: Secondary | ICD-10-CM

## 2015-03-11 DIAGNOSIS — IMO0001 Reserved for inherently not codable concepts without codable children: Secondary | ICD-10-CM

## 2015-03-11 DIAGNOSIS — M170 Bilateral primary osteoarthritis of knee: Secondary | ICD-10-CM | POA: Insufficient documentation

## 2015-03-11 DIAGNOSIS — H15109 Unspecified episcleritis, unspecified eye: Secondary | ICD-10-CM | POA: Insufficient documentation

## 2015-03-11 DIAGNOSIS — Z23 Encounter for immunization: Secondary | ICD-10-CM

## 2015-03-11 DIAGNOSIS — S4382XD Sprain of other specified parts of left shoulder girdle, subsequent encounter: Secondary | ICD-10-CM

## 2015-03-11 MED ORDER — HYDROCODONE-ACETAMINOPHEN 5-325 MG PO TABS: 1.0000 | ORAL_TABLET | ORAL | Status: DC | PRN

## 2015-03-11 MED ORDER — ZOLPIDEM TARTRATE 10 MG PO TABS: 10.0000 mg | ORAL_TABLET | Freq: Every day | ORAL | Status: DC

## 2015-03-11 MED ORDER — MELOXICAM 15 MG PO TABS: 15.0000 mg | ORAL_TABLET | Freq: Every day | ORAL | Status: DC

## 2015-03-11 MED ORDER — CYCLOBENZAPRINE HCL 10 MG PO TABS: 10.0000 mg | ORAL_TABLET | Freq: Four times a day (QID) | ORAL | Status: DC | PRN
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
## 2015-04-23 ENCOUNTER — Other Ambulatory Visit: Payer: Self-pay | Admitting: Family Medicine

## 2015-04-23 NOTE — Telephone Encounter (Signed)
Patient requesting refill. 

## 2015-04-25 ENCOUNTER — Other Ambulatory Visit: Payer: Self-pay | Admitting: Family Medicine

## 2015-04-25 NOTE — Telephone Encounter (Signed)
Patient requesting refill. 

## 2015-05-23 ENCOUNTER — Ambulatory Visit: Payer: 59 | Admitting: Family Medicine

## 2015-05-28 ENCOUNTER — Ambulatory Visit (INDEPENDENT_AMBULATORY_CARE_PROVIDER_SITE_OTHER): Payer: 59 | Admitting: Family Medicine

## 2015-05-28 ENCOUNTER — Encounter: Payer: Self-pay | Admitting: Family Medicine

## 2015-05-28 DIAGNOSIS — Z13220 Encounter for screening for lipoid disorders: Secondary | ICD-10-CM | POA: Diagnosis not present

## 2015-05-28 DIAGNOSIS — S4382XD Sprain of other specified parts of left shoulder girdle, subsequent encounter: Secondary | ICD-10-CM

## 2015-05-28 DIAGNOSIS — R202 Paresthesia of skin: Secondary | ICD-10-CM

## 2015-05-28 DIAGNOSIS — N951 Menopausal and female climacteric states: Secondary | ICD-10-CM | POA: Diagnosis not present

## 2015-05-28 DIAGNOSIS — R5383 Other fatigue: Secondary | ICD-10-CM | POA: Diagnosis not present

## 2015-05-28 DIAGNOSIS — H15102 Unspecified episcleritis, left eye: Secondary | ICD-10-CM | POA: Diagnosis not present

## 2015-05-28 DIAGNOSIS — G4700 Insomnia, unspecified: Secondary | ICD-10-CM | POA: Diagnosis not present

## 2015-05-28 DIAGNOSIS — S134XXD Sprain of ligaments of cervical spine, subsequent encounter: Secondary | ICD-10-CM | POA: Diagnosis not present

## 2015-05-28 DIAGNOSIS — Z131 Encounter for screening for diabetes mellitus: Secondary | ICD-10-CM | POA: Diagnosis not present

## 2015-05-28 DIAGNOSIS — IMO0001 Reserved for inherently not codable concepts without codable children: Secondary | ICD-10-CM

## 2015-05-28 DIAGNOSIS — S139XXD Sprain of joints and ligaments of unspecified parts of neck, subsequent encounter: Secondary | ICD-10-CM

## 2015-05-28 DIAGNOSIS — R002 Palpitations: Secondary | ICD-10-CM | POA: Diagnosis not present

## 2015-05-28 DIAGNOSIS — K219 Gastro-esophageal reflux disease without esophagitis: Secondary | ICD-10-CM

## 2015-05-28 MED ORDER — PHENTERMINE-TOPIRAMATE ER 7.5-46 MG PO CP24: 1.0000 | ORAL_CAPSULE | Freq: Every day | ORAL | Status: DC

## 2015-05-28 MED ORDER — ZOLPIDEM TARTRATE 10 MG PO TABS: 10.0000 mg | ORAL_TABLET | Freq: Every day | ORAL | Status: DC

## 2015-05-28 MED ORDER — PHENTERMINE-TOPIRAMATE ER 3.75-23 MG PO CP24: 1.0000 | ORAL_CAPSULE | Freq: Every day | ORAL | Status: DC

## 2015-05-28 MED ORDER — BLACK COHOSH 200 MG PO CAPS: 1.0000 | ORAL_CAPSULE | Freq: Two times a day (BID) | ORAL | Status: DC

## 2015-05-28 MED ORDER — CYCLOBENZAPRINE HCL 10 MG PO TABS: 10.0000 mg | ORAL_TABLET | Freq: Four times a day (QID) | ORAL | Status: DC | PRN

## 2015-05-28 MED ORDER — ESOMEPRAZOLE MAGNESIUM 40 MG PO CPDR: 40.0000 mg | DELAYED_RELEASE_CAPSULE | Freq: Every day | ORAL | Status: DC
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
## 2015-05-29 ENCOUNTER — Other Ambulatory Visit: Payer: Self-pay | Admitting: Family Medicine

## 2015-05-29 DIAGNOSIS — H15109 Unspecified episcleritis, unspecified eye: Secondary | ICD-10-CM

## 2015-05-29 DIAGNOSIS — R7982 Elevated C-reactive protein (CRP): Secondary | ICD-10-CM

## 2015-05-29 DIAGNOSIS — E559 Vitamin D deficiency, unspecified: Secondary | ICD-10-CM

## 2015-05-29 MED ORDER — VITAMIN D (ERGOCALCIFEROL) 1.25 MG (50000 UNIT) PO CAPS: 50000.0000 [IU] | ORAL_CAPSULE | ORAL | Status: DC

## 2015-05-30 LAB — COMPREHENSIVE METABOLIC PANEL
ALT: 10 IU/L (ref 0–32)
AST: 15 IU/L (ref 0–40)
Albumin/Globulin Ratio: 1.4 (ref 1.1–2.5)
Albumin: 4.4 g/dL (ref 3.5–5.5)
Alkaline Phosphatase: 123 IU/L — ABNORMAL HIGH (ref 39–117)
BUN/Creatinine Ratio: 18 (ref 9–23)
BUN: 14 mg/dL (ref 6–24)
Bilirubin Total: 0.5 mg/dL (ref 0.0–1.2)
CO2: 22 mmol/L (ref 18–29)
Calcium: 9.2 mg/dL (ref 8.7–10.2)
Chloride: 101 mmol/L (ref 96–106)
Creatinine, Ser: 0.79 mg/dL (ref 0.57–1.00)
GFR calc Af Amer: 102 mL/min/{1.73_m2} (ref >59)
GFR calc non Af Amer: 88 mL/min/{1.73_m2} (ref >59)
Globulin, Total: 3.1 g/dL (ref 1.5–4.5)
Glucose: 97 mg/dL (ref 65–99)
Potassium: 4.3 mmol/L (ref 3.5–5.2)
Sodium: 143 mmol/L (ref 134–144)
Total Protein: 7.5 g/dL (ref 6.0–8.5)

## 2015-05-30 LAB — LIPID PANEL
Chol/HDL Ratio: 3.9 ratio units (ref 0.0–4.4)
Cholesterol, Total: 192 mg/dL (ref 100–199)
HDL: 49 mg/dL (ref >39)
LDL Calculated: 127 mg/dL — ABNORMAL HIGH (ref 0–99)
Triglycerides: 79 mg/dL (ref 0–149)
VLDL Cholesterol Cal: 16 mg/dL (ref 5–40)

## 2015-05-30 LAB — LUPUS ANTICOAGULANT PANEL
Dilute Viper Venom Time: 61.8 s — ABNORMAL HIGH (ref 0.0–44.0)
PTT Lupus Anticoagulant: 55.7 s — ABNORMAL HIGH (ref 0.0–40.6)

## 2015-05-30 LAB — CBC WITH DIFFERENTIAL/PLATELET
Basophils Absolute: 0 10*3/uL (ref 0.0–0.2)
Basos: 0 %
EOS (ABSOLUTE): 0.2 10*3/uL (ref 0.0–0.4)
Eos: 3 %
Hematocrit: 40 % (ref 34.0–46.6)
Hemoglobin: 13 g/dL (ref 11.1–15.9)
Immature Grans (Abs): 0 10*3/uL (ref 0.0–0.1)
Immature Granulocytes: 0 %
Lymphocytes Absolute: 1.8 10*3/uL (ref 0.7–3.1)
Lymphs: 25 %
MCH: 28.6 pg (ref 26.6–33.0)
MCHC: 32.5 g/dL (ref 31.5–35.7)
MCV: 88 fL (ref 79–97)
Monocytes Absolute: 0.4 10*3/uL (ref 0.1–0.9)
Monocytes: 6 %
Neutrophils Absolute: 4.8 10*3/uL (ref 1.4–7.0)
Neutrophils: 66 %
Platelets: 316 10*3/uL (ref 150–379)
RBC: 4.55 x10E6/uL (ref 3.77–5.28)
RDW: 15.2 % (ref 12.3–15.4)
WBC: 7.3 10*3/uL (ref 3.4–10.8)

## 2015-05-30 LAB — HEXAGONAL PHASE PHOSPHOLIPID: Hexagonal Phase Phospholipid: 21 s — ABNORMAL HIGH (ref 0–11)

## 2015-05-30 LAB — PTT-LA MIX: PTT-LA Mix: 48.9 s — ABNORMAL HIGH (ref 0.0–40.6)

## 2015-05-30 LAB — DRVVT CONFIRM: dRVVT Confirm: 1.5 ratio — ABNORMAL HIGH (ref 0.8–1.2)

## 2015-05-30 LAB — HEMOGLOBIN A1C
Est. average glucose Bld gHb Est-mCnc: 123 mg/dL
Hgb A1c MFr Bld: 5.9 % — ABNORMAL HIGH (ref 4.8–5.6)

## 2015-05-30 LAB — TSH: TSH: 1.63 u[IU]/mL (ref 0.450–4.500)

## 2015-05-30 LAB — DRVVT MIX: dRVVT Mix: 44.7 s — ABNORMAL HIGH (ref 0.0–44.0)

## 2015-05-30 LAB — VITAMIN B12: Vitamin B-12: 422 pg/mL (ref 211–946)

## 2015-05-30 LAB — C-REACTIVE PROTEIN: CRP: 16.2 mg/L — ABNORMAL HIGH (ref 0.0–4.9)

## 2015-05-30 LAB — VITAMIN D 25 HYDROXY (VIT D DEFICIENCY, FRACTURES): Vit D, 25-Hydroxy: <4 ng/mL — ABNORMAL LOW (ref 30.0–100.0)

## 2015-05-30 LAB — SEDIMENTATION RATE: Sed Rate: 16 mm/hr (ref 0–32)

## 2015-06-02 ENCOUNTER — Telehealth: Payer: Self-pay | Admitting: Family Medicine

## 2015-06-02 NOTE — Telephone Encounter (Signed)
Patient received a call from the rheumatologist and she does not understand why she was referred there. She also have not received a call pertaining to her lab test results. Please return call

## 2015-06-02 NOTE — Telephone Encounter (Signed)
I contacted this patient back and she stated that the reason I could not get in contact with her is because they had the wrong number in the system even after she corrected them the last time she came in for a visit. She did not sound too please with this nor that she was receiving a call from the Rheumatologist. She needs to know if she has Lupus or not and she wants Dr. Carlynn Purl to call her back to get all of this cleared up.

## 2015-06-02 NOTE — Telephone Encounter (Signed)
Discussed results with patient, explained the Rheumatologist will have to further evaluate her and give her a formal diagnosis

## 2015-06-26 ENCOUNTER — Other Ambulatory Visit: Payer: Self-pay | Admitting: Family Medicine

## 2015-07-08 ENCOUNTER — Ambulatory Visit (INDEPENDENT_AMBULATORY_CARE_PROVIDER_SITE_OTHER): Payer: 59 | Admitting: Family Medicine

## 2015-07-08 ENCOUNTER — Encounter (HOSPITAL_COMMUNITY): Payer: Self-pay | Admitting: Emergency Medicine

## 2015-07-08 ENCOUNTER — Emergency Department (HOSPITAL_COMMUNITY)
Admission: EM | Admit: 2015-07-08 | Discharge: 2015-07-08 | Disposition: A | Discharge status: Home / Self Care | Payer: 59 | Attending: Emergency Medicine | Admitting: Emergency Medicine

## 2015-07-08 ENCOUNTER — Ambulatory Visit
Admission: RE | Admit: 2015-07-08 | Discharge: 2015-07-08 | Disposition: A | Discharge status: Home / Self Care | Payer: 59 | Source: Ambulatory Visit | Attending: Family Medicine | Admitting: Family Medicine

## 2015-07-08 ENCOUNTER — Encounter: Payer: Self-pay | Admitting: Family Medicine

## 2015-07-08 ENCOUNTER — Emergency Department (HOSPITAL_COMMUNITY): Payer: 59

## 2015-07-08 ENCOUNTER — Other Ambulatory Visit: Payer: Self-pay | Admitting: Family Medicine

## 2015-07-08 ENCOUNTER — Telehealth: Payer: Self-pay

## 2015-07-08 ENCOUNTER — Other Ambulatory Visit
Admission: RE | Admit: 2015-07-08 | Discharge: 2015-07-08 | Disposition: A | Discharge status: Home / Self Care | Payer: 59 | Source: Ambulatory Visit | Attending: *Deleted | Admitting: *Deleted

## 2015-07-08 VITALS — BP 158/92 | HR 74 | Temp 98.2°F | Resp 20 | Ht 65.0 in | Wt 259.8 lb

## 2015-07-08 DIAGNOSIS — R072 Precordial pain: Secondary | ICD-10-CM

## 2015-07-08 DIAGNOSIS — R079 Chest pain, unspecified: Secondary | ICD-10-CM | POA: Diagnosis not present

## 2015-07-08 DIAGNOSIS — R42 Dizziness and giddiness: Secondary | ICD-10-CM | POA: Diagnosis not present

## 2015-07-08 DIAGNOSIS — R202 Paresthesia of skin: Secondary | ICD-10-CM

## 2015-07-08 DIAGNOSIS — I10 Essential (primary) hypertension: Secondary | ICD-10-CM | POA: Insufficient documentation

## 2015-07-08 DIAGNOSIS — R61 Generalized hyperhidrosis: Secondary | ICD-10-CM | POA: Insufficient documentation

## 2015-07-08 DIAGNOSIS — R7982 Elevated C-reactive protein (CRP): Secondary | ICD-10-CM | POA: Insufficient documentation

## 2015-07-08 DIAGNOSIS — R9431 Abnormal electrocardiogram [ECG] [EKG]: Secondary | ICD-10-CM

## 2015-07-08 DIAGNOSIS — R0602 Shortness of breath: Secondary | ICD-10-CM | POA: Insufficient documentation

## 2015-07-08 HISTORY — DX: Essential (primary) hypertension: I10

## 2015-07-08 LAB — BASIC METABOLIC PANEL
Anion gap: 13 (ref 5–15)
BUN: 8 mg/dL (ref 6–20)
CO2: 23 mmol/L (ref 22–32)
Calcium: 8.9 mg/dL (ref 8.9–10.3)
Chloride: 107 mmol/L (ref 101–111)
Creatinine, Ser: 0.71 mg/dL (ref 0.44–1.00)
GFR calc Af Amer: >60 mL/min (ref >60)
GFR calc non Af Amer: >60 mL/min (ref >60)
Glucose, Bld: 95 mg/dL (ref 65–99)
Potassium: 3.9 mmol/L (ref 3.5–5.1)
Sodium: 143 mmol/L (ref 135–145)

## 2015-07-08 LAB — CBC
HCT: 38.2 % (ref 36.0–46.0)
Hemoglobin: 12.1 g/dL (ref 12.0–15.0)
MCH: 28.5 pg (ref 26.0–34.0)
MCHC: 31.7 g/dL (ref 30.0–36.0)
MCV: 90.1 fL (ref 78.0–100.0)
Platelets: 303 10*3/uL (ref 150–400)
RBC: 4.24 MIL/uL (ref 3.87–5.11)
RDW: 15.8 % — ABNORMAL HIGH (ref 11.5–15.5)
WBC: 8.1 10*3/uL (ref 4.0–10.5)

## 2015-07-08 LAB — I-STAT TROPONIN, ED: Troponin i, poc: 0 ng/mL (ref 0.00–0.08)

## 2015-07-08 LAB — CKMB (ARMC ONLY): CK, MB: 1.3 ng/mL (ref 0.5–5.0)

## 2015-07-08 LAB — TROPONIN I: Troponin I: <0.03 ng/mL (ref <0.031)

## 2015-07-08 MED ORDER — HYDROCHLOROTHIAZIDE 12.5 MG PO CAPS: 12.5000 mg | ORAL_CAPSULE | Freq: Every day | ORAL | Status: DC

## 2015-07-08 NOTE — ED Notes (Signed)
Pt did not answer.

## 2015-07-08 NOTE — Telephone Encounter (Signed)
Per the request of Dr. Krichna SAlba Coryowles, a rx for HCTZ 12.5mg , # 30, take 1 by mouth daily, 0 refills, was called in to [facility] in [place].

## 2015-07-08 NOTE — ED Notes (Signed)
Pt. reports mid / left chest pain with SOB , lightheaded, diaphoresis and hypertension ( 178/110)  this morning .

## 2015-07-08 NOTE — Progress Notes (Signed)
Name: Sharon Owen   MRN: [id number]    DOB: [birth year]   Date:07/08/2015       Progress Note  Subjective  Chief Complaint  Chief Complaint  Patient presents with  . Hypertension    Patient states since Friday she started feeling bad, lightheadness, dizziness, heart palpations, and chest discomfort. Patient went to [facility] this morning at 5 a.m. and they performed a EKG and blood work.  EKG came back normal, patient states her left side is achy and her head is throbbing she took Aspirin due to elevated BP reading of 177/102 on brachial BP machine.     HPI  New onset elevated bp with dizziness , precordial pain and paresthesia: She states that she started to feel bad three days ago with some dizziness and fatigue. Symptoms resolved, but this am he woke up from her sleep around 4 am with chest pain, dizziness, left arm tingling, left facial tingling, headache. BP was high at 177/102 and she went to [facility]. First set of troponin was negative, also had basic panel and CBC that were normal. She took an aspirin and also a labetalol dose 25 mg ( husband's medication ), Advil for her headache and left [facility] against medical advise to come here because the wait was too long. She states her headache is not as severe, facial tingling has resolved but still has symptoms on left arm, she still has some epigastric pain and left side chest pain ( down to 3/10 ). She states SOB resolved, she had some diaphoresis but resolved, no nausea or vomiting. No change in life stress.    Patient Active Problem List   Diagnosis Date Noted  . Elevated C-reactive protein 07/08/2015  . Perimenopause 05/28/2015  . Morbid obesity ([place]) 03/11/2015  . Gastro-esophageal reflux disease without esophagitis 03/11/2015  . Anxiety 03/11/2015  . Insomnia, persistent 03/11/2015  . Osteoarthritis of both knees 03/11/2015  . Episcleritis 03/11/2015  . History of fracture of tibia 05/23/2014  . History of sprain of ankle  05/23/2014  . S/P right knee arthroscopy 12/28/2011  . Medial meniscus, posterior horn derangement 12/01/2011  . Mononeuritis leg 07/17/2011  . Night muscle spasms 07/17/2011    Past Surgical History  Procedure Laterality Date  . Chondroplasty  12/24/2011    Procedure: CHONDROPLASTY;  Surgeon: Carole Civil, MD;  Location: [facility];  Service: Orthopedics;  Laterality: Right;  . Tibia im nail insertion Right 05/23/2014    Procedure: INTRAMEDULLARY (IM) NAIL RIGHT TIBIA;  Surgeon: Alta Corning, MD;  Location: [facility];  Service: Orthopedics;  Laterality: Right;  . Cesarean section    . Leg surgery      Family History  Problem Relation Age of Onset  . Hypertension Mother   . Hypertension Father   . Diabetes Father   . Insomnia Sister   . Hypertension Sister   . Diabetes Sister     Social History   Social History  . Marital Status: Single    Spouse Name: N/A  . Number of Children: N/A  . Years of Education: N/A   Occupational History  . Not on file.   Social History Main Topics  . Smoking status: Never Smoker   . Smokeless tobacco: Never Used  . Alcohol Use: 0.0 oz/week    0 Standard drinks or equivalent per week     Comment: occ  . Drug Use: No  . Sexual Activity: Yes   Other Topics Concern  . Not on  file   Social History Narrative     Current outpatient prescriptions:  .  ALPRAZolam (XANAX) 0.5 MG tablet, TAKE HALF TO ONE TABLET BY MOUTH EVERY 4 HOURS AS NEEDED FOR ANXIETY, Disp: 30 tablet, Rfl: 0 .  Black Cohosh 200 MG CAPS, Take 1 capsule (200 mg total) by mouth 2 (two) times daily., Disp: 60 capsule, Rfl: 0 .  cyclobenzaprine (FLEXERIL) 10 MG tablet, TAKE 1 TABLET (10 MG TOTAL) BY MOUTH EVERY 6 (SIX) HOURS AS NEEDED., Disp: 90 tablet, Rfl: 0 .  diphenhydrAMINE (BENADRYL) 25 MG tablet, Take 25 mg by mouth every 6 (six) hours as needed. Reported on 05/28/2015, Disp: , Rfl:  .  esomeprazole (NEXIUM) 40 MG capsule, Take 1 capsule (40 mg total) by mouth daily at 12  noon. Reported on 05/28/2015, Disp: 30 capsule, Rfl: 5 .  Vitamin D, Ergocalciferol, (DRISDOL) 50000 units CAPS capsule, Take 1 capsule (50,000 Units total) by mouth every 7 (seven) days., Disp: 12 capsule, Rfl: 0 .  zolpidem (AMBIEN) 10 MG tablet, Take 10 mg by mouth at bedtime., Disp: , Rfl: 2  Allergies  Allergen Reactions  . Adhesive [Tape] Itching  . Latex Itching  . Gabapentin Rash  . Morphine And Related Rash     ROS  Ten systems reviewed and is negative except as mentioned in HPI   Objective  Filed Vitals:   07/08/15 0938  BP: 158/92  Pulse: 74  Temp: 98.2 F (36.8 C)  TempSrc: Oral  Resp: 20  Height: '5\' 5"'  (1.651 m)  Weight: 259 lb 12.8 oz (117.845 kg)  SpO2: 99%    Body mass index is 43.23 kg/(m^2).  Physical Exam  Constitutional: Patient appears well-developed and well-nourished. Obese. No distress.  HEENT: head atraumatic, normocephalic, pupils equal and reactive to light,  neck supple, throat within normal limits Cardiovascular: Normal rate, regular rhythm and normal heart sounds.  No murmur heard. No BLE edema. Pulmonary/Chest: Effort normal and breath sounds normal. No respiratory distress. Abdominal: Soft.  There is no tenderness. Psychiatric: Patient has a normal mood and affect. behavior is normal. Judgment and thought content normal. Neurological: normal cranial nerves, romberg negative, normal gait, normal patellar reflexes, no nystagmus.   Recent Results (from the past 2160 hour(s))  Lipid panel     Status: Abnormal   Collection Time: 05/28/15  9:44 AM  Result Value Ref Range   Cholesterol, Total 192 100 - 199 mg/dL   Triglycerides 79 0 - 149 mg/dL   HDL 49 >39 mg/dL   VLDL Cholesterol Cal 16 5 - 40 mg/dL   LDL Calculated 127 (H) 0 - 99 mg/dL   Chol/HDL Ratio 3.9 0.0 - 4.4 ratio units    Comment:                                   T. Chol/HDL Ratio                                             Men  Women                               1/2  Avg.Risk  3.4    3.3  Avg.Risk  5.0    4.4                                2X Avg.Risk  9.6    7.1                                3X Avg.Risk 23.4   11.0   Hemoglobin A1c     Status: Abnormal   Collection Time: 05/28/15  9:44 AM  Result Value Ref Range   Hgb A1c MFr Bld 5.9 (H) 4.8 - 5.6 %    Comment:          Pre-diabetes: 5.7 - 6.4          Diabetes: >6.4          Glycemic control for adults with diabetes: <7.0    Est. average glucose Bld gHb Est-mCnc 123 mg/dL  Comprehensive metabolic panel     Status: Abnormal   Collection Time: 05/28/15  9:44 AM  Result Value Ref Range   Glucose 97 65 - 99 mg/dL    Comment: Specimen received in contact with cells. No visible hemolysis present. However GLUC may be decreased and K increased. Clinical correlation indicated.    BUN 14 6 - 24 mg/dL   Creatinine, Ser 0.79 0.57 - 1.00 mg/dL   GFR calc non Af Amer 88 >59 mL/min/1.73   GFR calc Af Amer 102 >59 mL/min/1.73   BUN/Creatinine Ratio 18 9 - 23   Sodium 143 134 - 144 mmol/L   Potassium 4.3 3.5 - 5.2 mmol/L   Chloride 101 96 - 106 mmol/L   CO2 22 18 - 29 mmol/L   Calcium 9.2 8.7 - 10.2 mg/dL   Total Protein 7.5 6.0 - 8.5 g/dL   Albumin 4.4 3.5 - 5.5 g/dL   Globulin, Total 3.1 1.5 - 4.5 g/dL   Albumin/Globulin Ratio 1.4 1.1 - 2.5   Bilirubin Total 0.5 0.0 - 1.2 mg/dL   Alkaline Phosphatase 123 (H) 39 - 117 IU/L   AST 15 0 - 40 IU/L   ALT 10 0 - 32 IU/L  TSH     Status: None   Collection Time: 05/28/15  9:44 AM  Result Value Ref Range   TSH 1.630 0.450 - 4.500 uIU/mL  Vitamin B12     Status: None   Collection Time: 05/28/15  9:44 AM  Result Value Ref Range   Vitamin B-12 422 211 - 946 pg/mL  VITAMIN D 25 Hydroxy (Vit-D Deficiency, Fractures)     Status: Abnormal   Collection Time: 05/28/15  9:44 AM  Result Value Ref Range   Vit D, 25-Hydroxy <4.0 (L) 30.0 - 100.0 ng/mL    Comment: Vitamin D deficiency has been defined by the Institute of Medicine  and an Endocrine Society practice guideline as a level of serum 25-OH vitamin D less than 20 ng/mL (1,2). The Endocrine Society went on to further define vitamin D insufficiency as a level between 21 and 29 ng/mL (2). 1. IOM (Institute of Medicine). 2010. Dietary reference    intakes for calcium and D. Gutierrez: The    [employer]. 2. Holick MF, Binkley [place], Bischoff-Ferrari HA, et al.    Evaluation, treatment, and prevention of vitamin D    deficiency: an Endocrine Society clinical practice    guideline. JCEM. 2011 Jul; 96(7):1911-30.   Sedimentation  rate     Status: None   Collection Time: 05/28/15  9:44 AM  Result Value Ref Range   Sed Rate 16 0 - 32 mm/hr  C-reactive protein     Status: Abnormal   Collection Time: 05/28/15  9:44 AM  Result Value Ref Range   CRP 16.2 (H) 0.0 - 4.9 mg/L  Lupus anticoagulant panel     Status: Abnormal   Collection Time: 05/28/15  9:44 AM  Result Value Ref Range   PTT Lupus Anticoagulant 55.7 (H) 0.0 - 40.6 sec    Comment: **Effective June 16, 2015 PTT-LA reference**   interval will be changing to: 0.0 - 43.6    Dilute Viper Venom Time 61.8 (H) 0.0 - 44.0 sec   Lupus Anticoag Interp Comment:     Comment: Results are consistent with the presence of a lupus anticoagulant. NOTE: Only persistent lupus anticoagulants are thought to be of clinical significance. For this reason, repeat testing in 12 or more weeks after an initial positive result should be considered to confirm or refute the presence of a lupus anticoagulant, depending on clinical presentation. Results of lupus anticoagulant tests may be falsely positive in the presence of certain anticoagulant therapies.   CBC with Differential/Platelet     Status: None   Collection Time: 05/28/15  9:44 AM  Result Value Ref Range   WBC 7.3 3.4 - 10.8 x10E3/uL   RBC 4.55 3.77 - 5.28 x10E6/uL   Hemoglobin 13.0 11.1 - 15.9 g/dL   Hematocrit 40.0 34.0 - 46.6 %   MCV 88 79 - 97 fL    MCH 28.6 26.6 - 33.0 pg   MCHC 32.5 31.5 - 35.7 g/dL   RDW 15.2 12.3 - 15.4 %   Platelets 316 150 - 379 x10E3/uL   Neutrophils 66 %   Lymphs 25 %   Monocytes 6 %   Eos 3 %   Basos 0 %   Neutrophils Absolute 4.8 1.4 - 7.0 x10E3/uL   Lymphocytes Absolute 1.8 0.7 - 3.1 x10E3/uL   Monocytes Absolute 0.4 0.1 - 0.9 x10E3/uL   EOS (ABSOLUTE) 0.2 0.0 - 0.4 x10E3/uL   Basophils Absolute 0.0 0.0 - 0.2 x10E3/uL   Immature Granulocytes 0 %   Immature Grans (Abs) 0.0 0.0 - 0.1 x10E3/uL  PTT-LA Mix     Status: Abnormal   Collection Time: 05/28/15  9:44 AM  Result Value Ref Range   PTT-LA Mix 48.9 (H) 0.0 - 40.6 sec  dRVVT Mix     Status: Abnormal   Collection Time: 05/28/15  9:44 AM  Result Value Ref Range   dRVVT Mix 44.7 (H) 0.0 - 44.0 sec  Hexagonal Phase Phospholipid     Status: Abnormal   Collection Time: 05/28/15  9:44 AM  Result Value Ref Range   Hexagonal Phase Phospholipid 21 (H) 0 - 11 sec  dRVVT Confirm     Status: Abnormal   Collection Time: 05/28/15  9:44 AM  Result Value Ref Range   dRVVT Confirm 1.5 (H) 0.8 - 1.2 ratio  Basic metabolic panel     Status: None   Collection Time: 07/08/15  6:34 AM  Result Value Ref Range   Sodium 143 135 - 145 mmol/L   Potassium 3.9 3.5 - 5.1 mmol/L   Chloride 107 101 - 111 mmol/L   CO2 23 22 - 32 mmol/L   Glucose, Bld 95 65 - 99 mg/dL   BUN 8 6 - 20 mg/dL   Creatinine, Ser 0.71 0.44 -  1.00 mg/dL   Calcium 8.9 8.9 - 10.3 mg/dL   GFR calc non Af Amer >60 >60 mL/min   GFR calc Af Amer >60 >60 mL/min    Comment: (NOTE) The eGFR has been calculated using the CKD EPI equation. This calculation has not been validated in all clinical situations. eGFR's persistently <60 mL/min signify possible Chronic Kidney Disease.    Anion gap 13 5 - 15  CBC     Status: Abnormal   Collection Time: 07/08/15  6:34 AM  Result Value Ref Range   WBC 8.1 4.0 - 10.5 K/uL   RBC 4.24 3.87 - 5.11 MIL/uL   Hemoglobin 12.1 12.0 - 15.0 g/dL   HCT 38.2 36.0 -  46.0 %   MCV 90.1 78.0 - 100.0 fL   MCH 28.5 26.0 - 34.0 pg   MCHC 31.7 30.0 - 36.0 g/dL   RDW 15.8 (H) 11.5 - 15.5 %   Platelets 303 150 - 400 K/uL  I-stat troponin, ED (not at [facility], [facility])     Status: None   Collection Time: 07/08/15  6:43 AM  Result Value Ref Range   Troponin i, poc 0.00 0.00 - 0.08 ng/mL   Comment 3            Comment: Due to the release kinetics of cTnI, a negative result within the first hours of the onset of symptoms does not rule out myocardial infarction with certainty. If myocardial infarction is still suspected, repeat the test at appropriate intervals.       PHQ2/9: Depression screen [facility] 2/9 05/28/2015 03/11/2015  Decreased Interest 0 0  Down, Depressed, Hopeless 0 0  PHQ - 2 Score 0 0    Fall Risk: Fall Risk  05/28/2015 03/11/2015  Falls in the past year? No Yes  Number falls in past yr: - 1  Injury with Fall? - Yes     Assessment & Plan  1. Uncontrolled hypertension  Holding off on medication until CT head is cleared, don't want to lower bp if she is having a TIA/CVA. Explained that would be safer to be at [facility], but she refuses to go.   2. Precordial pain  - Troponin I - CKMB  3. Abnormal EKG  Mild, refer to cardiologist if negative enzymes  4. Dizziness  - CT Head Wo Contrast; stat  5. Paresthesia  - CT Head Wo Contrast; stat

## 2015-07-08 NOTE — ED Notes (Signed)
No answer from pt in lobby 

## 2015-07-09 ENCOUNTER — Encounter: Payer: Self-pay | Admitting: Family Medicine

## 2015-07-09 ENCOUNTER — Ambulatory Visit (INDEPENDENT_AMBULATORY_CARE_PROVIDER_SITE_OTHER): Payer: 59 | Admitting: Family Medicine

## 2015-07-09 VITALS — BP 134/82 | HR 79 | Temp 98.5°F | Resp 12 | Ht 65.0 in | Wt 256.7 lb

## 2015-07-09 DIAGNOSIS — R072 Precordial pain: Secondary | ICD-10-CM | POA: Diagnosis not present

## 2015-07-09 DIAGNOSIS — R040 Epistaxis: Secondary | ICD-10-CM | POA: Diagnosis not present

## 2015-07-09 DIAGNOSIS — R51 Headache: Secondary | ICD-10-CM

## 2015-07-09 DIAGNOSIS — F419 Anxiety disorder, unspecified: Secondary | ICD-10-CM

## 2015-07-09 DIAGNOSIS — I10 Essential (primary) hypertension: Secondary | ICD-10-CM | POA: Diagnosis not present

## 2015-07-09 DIAGNOSIS — R202 Paresthesia of skin: Secondary | ICD-10-CM

## 2015-07-09 DIAGNOSIS — R519 Headache, unspecified: Secondary | ICD-10-CM

## 2015-07-09 MED ORDER — ASPIRIN EC 81 MG PO TBEC: 81.0000 mg | DELAYED_RELEASE_TABLET | Freq: Every day | ORAL | Status: DC

## 2015-07-09 MED ORDER — ALPRAZOLAM 0.5 MG PO TABS: ORAL_TABLET | ORAL | Status: DC

## 2015-07-09 NOTE — Progress Notes (Signed)
Name: Sharon Owen   MRN: [id number]    DOB: 1965-11-05   Date:07/09/2015       Progress Note  Subjective  Chief Complaint  Chief Complaint  Patient presents with  . Follow-up    patient feels much better.    HPI  Precordial pain: resolved now, negative cardiac enzymes, but would like to see cardiologist  Paresthesia: resolved since yesterday, on left arm and left side of face, possible TIA?   HTN: bp was very high yesterday, but down now on HCTZ, bp is at goal, she has been having episodes of palpitation today she has a mild dull headache.   Epistaxis Recurrent: over the past few months, she stopped using nasal steroid , no episodes past few weeks. We will monitor for now.    Patient Active Problem List   Diagnosis Date Noted  . Elevated C-reactive protein 07/08/2015  . Perimenopause 05/28/2015  . Morbid obesity (Regino Ramirez) 03/11/2015  . Gastro-esophageal reflux disease without esophagitis 03/11/2015  . Anxiety 03/11/2015  . Insomnia, persistent 03/11/2015  . Osteoarthritis of both knees 03/11/2015  . Episcleritis 03/11/2015  . History of fracture of tibia 05/23/2014  . History of sprain of ankle 05/23/2014  . S/P right knee arthroscopy 12/28/2011  . Medial meniscus, posterior horn derangement 12/01/2011  . Mononeuritis leg 07/17/2011  . Night muscle spasms 07/17/2011    Past Surgical History  Procedure Laterality Date  . Chondroplasty  12/24/2011    Procedure: CHONDROPLASTY;  Surgeon: Carole Civil, MD;  Location: [facility];  Service: Orthopedics;  Laterality: Right;  . Tibia im nail insertion Right 05/23/2014    Procedure: INTRAMEDULLARY (IM) NAIL RIGHT TIBIA;  Surgeon: Alta Corning, MD;  Location: [facility];  Service: Orthopedics;  Laterality: Right;  . Cesarean section    . Leg surgery      Family History  Problem Relation Age of Onset  . Hypertension Mother   . Hypertension Father   . Diabetes Father   . Insomnia Sister   . Hypertension Sister   . Diabetes  Sister     Social History   Social History  . Marital Status: Single    Spouse Name: N/A  . Number of Children: N/A  . Years of Education: N/A   Occupational History  . Not on file.   Social History Main Topics  . Smoking status: Never Smoker   . Smokeless tobacco: Never Used  . Alcohol Use: 0.0 oz/week    0 Standard drinks or equivalent per week     Comment: occ  . Drug Use: No  . Sexual Activity: Yes   Other Topics Concern  . Not on file   Social History Narrative     Current outpatient prescriptions:  .  ALPRAZolam (XANAX) 0.5 MG tablet, TAKE HALF TO ONE TABLET BY MOUTH EVERY 4 HOURS AS NEEDED FOR ANXIETY, Disp: 30 tablet, Rfl: 0 .  aspirin EC 81 MG tablet, Take 1 tablet (81 mg total) by mouth daily., Disp: 30 tablet, Rfl: 0 .  Black Cohosh 200 MG CAPS, Take 1 capsule (200 mg total) by mouth 2 (two) times daily., Disp: 60 capsule, Rfl: 0 .  cyclobenzaprine (FLEXERIL) 10 MG tablet, TAKE 1 TABLET (10 MG TOTAL) BY MOUTH EVERY 6 (SIX) HOURS AS NEEDED., Disp: 90 tablet, Rfl: 0 .  diphenhydrAMINE (BENADRYL) 25 MG tablet, Take 25 mg by mouth every 6 (six) hours as needed. Reported on 05/28/2015, Disp: , Rfl:  .  esomeprazole (NEXIUM) 40 MG capsule,  Take 1 capsule (40 mg total) by mouth daily at 12 noon. Reported on 05/28/2015, Disp: 30 capsule, Rfl: 5 .  hydrochlorothiazide (MICROZIDE) 12.5 MG capsule, Take 1 capsule (12.5 mg total) by mouth daily., Disp: 30 capsule, Rfl: 0 .  Vitamin D, Ergocalciferol, (DRISDOL) 50000 units CAPS capsule, Take 1 capsule (50,000 Units total) by mouth every 7 (seven) days., Disp: 12 capsule, Rfl: 0 .  zolpidem (AMBIEN) 10 MG tablet, Take 10 mg by mouth at bedtime., Disp: , Rfl: 2  Allergies  Allergen Reactions  . Adhesive [Tape] Itching  . Latex Itching  . Gabapentin Rash  . Morphine And Related Rash     ROS  Ten systems reviewed and is negative except as mentioned in HPI   Objective  Filed Vitals:   07/09/15 1156  BP: 134/82  Pulse:  79  Temp: 98.5 F (36.9 C)  TempSrc: Oral  Resp: 12  Height: _0  (1.651 m)  Weight: 256 lb 11.2 oz (116.438 kg)  SpO2: 98%    Body mass index is 42.72 kg/(m^2).  Physical Exam  Constitutional: Patient appears well-developed and well-nourished. Obese No distress.  HEENT: head atraumatic, normocephalic, pupils equal and reactive to light, neck supple, throat within normal limits Cardiovascular: Normal rate, regular rhythm and normal heart sounds.  No murmur heard. No BLE edema.No carotid bruit Pulmonary/Chest: Effort normal and breath sounds normal. No respiratory distress. Abdominal: Soft.  There is no tenderness. Psychiatric: Patient has a normal mood and affect. behavior is normal. Judgment and thought content normal. Neurological: normal exam.  Recent Results (from the past 2160 hour(s))  Lipid panel     Status: Abnormal   Collection Time: 05/28/15  9:44 AM  Result Value Ref Range   Cholesterol, Total 192 100 - 199 mg/dL   Triglycerides 79 0 - 149 mg/dL   HDL 49 >39 mg/dL   VLDL Cholesterol Cal 16 5 - 40 mg/dL   LDL Calculated 127 (H) 0 - 99 mg/dL   Chol/HDL Ratio 3.9 0.0 - 4.4 ratio units    Comment:                                   T. Chol/HDL Ratio                                             Men  Women                               1/2 Avg.Risk  3.4    3.3                                   Avg.Risk  5.0    4.4                                2X Avg.Risk  9.6    7.1                                3X Avg.Risk 23.4   11.0   Hemoglobin A1c  Status: Abnormal   Collection Time: 05/28/15  9:44 AM  Result Value Ref Range   Hgb A1c MFr Bld 5.9 (H) 4.8 - 5.6 %    Comment:          Pre-diabetes: 5.7 - 6.4          Diabetes: >6.4          Glycemic control for adults with diabetes: <7.0    Est. average glucose Bld gHb Est-mCnc 123 mg/dL  Comprehensive metabolic panel     Status: Abnormal   Collection Time: 05/28/15  9:44 AM  Result Value Ref Range   Glucose 97 65 - 99  mg/dL    Comment: Specimen received in contact with cells. No visible hemolysis present. However GLUC may be decreased and K increased. Clinical correlation indicated.    BUN 14 6 - 24 mg/dL   Creatinine, Ser 0.79 0.57 - 1.00 mg/dL   GFR calc non Af Amer 88 >59 mL/min/1.73   GFR calc Af Amer 102 >59 mL/min/1.73   BUN/Creatinine Ratio 18 9 - 23   Sodium 143 134 - 144 mmol/L   Potassium 4.3 3.5 - 5.2 mmol/L   Chloride 101 96 - 106 mmol/L   CO2 22 18 - 29 mmol/L   Calcium 9.2 8.7 - 10.2 mg/dL   Total Protein 7.5 6.0 - 8.5 g/dL   Albumin 4.4 3.5 - 5.5 g/dL   Globulin, Total 3.1 1.5 - 4.5 g/dL   Albumin/Globulin Ratio 1.4 1.1 - 2.5   Bilirubin Total 0.5 0.0 - 1.2 mg/dL   Alkaline Phosphatase 123 (H) 39 - 117 IU/L   AST 15 0 - 40 IU/L   ALT 10 0 - 32 IU/L  TSH     Status: None   Collection Time: 05/28/15  9:44 AM  Result Value Ref Range   TSH 1.630 0.450 - 4.500 uIU/mL  Vitamin B12     Status: None   Collection Time: 05/28/15  9:44 AM  Result Value Ref Range   Vitamin B-12 422 211 - 946 pg/mL  VITAMIN D 25 Hydroxy (Vit-D Deficiency, Fractures)     Status: Abnormal   Collection Time: 05/28/15  9:44 AM  Result Value Ref Range   Vit D, 25-Hydroxy <4.0 (L) 30.0 - 100.0 ng/mL    Comment: Vitamin D deficiency has been defined by the Institute of Medicine and an Endocrine Society practice guideline as a level of serum 25-OH vitamin D less than 20 ng/mL (1,2). The Endocrine Society went on to further define vitamin D insufficiency as a level between 21 and 29 ng/mL (2). 1. IOM (Institute of Medicine). 2010. Dietary reference    intakes for calcium and D. [place]: The    [employer]. 2. Holick MF, Binkley Chester, Bischoff-Ferrari HA, et al.    Evaluation, treatment, and prevention of vitamin D    deficiency: an Endocrine Society clinical practice    guideline. JCEM. 2011 Jul; 96(7):1911-30.   Sedimentation rate     Status: None   Collection Time: 05/28/15  9:44 AM   Result Value Ref Range   Sed Rate 16 0 - 32 mm/hr  C-reactive protein     Status: Abnormal   Collection Time: 05/28/15  9:44 AM  Result Value Ref Range   CRP 16.2 (H) 0.0 - 4.9 mg/L  Lupus anticoagulant panel     Status: Abnormal   Collection Time: 05/28/15  9:44 AM  Result Value Ref Range   PTT Lupus Anticoagulant 55.7 (H)  0.0 - 40.6 sec    Comment: **Effective June 16, 2015 PTT-LA reference**   interval will be changing to: 0.0 - 43.6    Dilute Viper Venom Time 61.8 (H) 0.0 - 44.0 sec   Lupus Anticoag Interp Comment:     Comment: Results are consistent with the presence of a lupus anticoagulant. NOTE: Only persistent lupus anticoagulants are thought to be of clinical significance. For this reason, repeat testing in 12 or more weeks after an initial positive result should be considered to confirm or refute the presence of a lupus anticoagulant, depending on clinical presentation. Results of lupus anticoagulant tests may be falsely positive in the presence of certain anticoagulant therapies.   CBC with Differential/Platelet     Status: None   Collection Time: 05/28/15  9:44 AM  Result Value Ref Range   WBC 7.3 3.4 - 10.8 x10E3/uL   RBC 4.55 3.77 - 5.28 x10E6/uL   Hemoglobin 13.0 11.1 - 15.9 g/dL   Hematocrit 40.0 34.0 - 46.6 %   MCV 88 79 - 97 fL   MCH 28.6 26.6 - 33.0 pg   MCHC 32.5 31.5 - 35.7 g/dL   RDW 15.2 12.3 - 15.4 %   Platelets 316 150 - 379 x10E3/uL   Neutrophils 66 %   Lymphs 25 %   Monocytes 6 %   Eos 3 %   Basos 0 %   Neutrophils Absolute 4.8 1.4 - 7.0 x10E3/uL   Lymphocytes Absolute 1.8 0.7 - 3.1 x10E3/uL   Monocytes Absolute 0.4 0.1 - 0.9 x10E3/uL   EOS (ABSOLUTE) 0.2 0.0 - 0.4 x10E3/uL   Basophils Absolute 0.0 0.0 - 0.2 x10E3/uL   Immature Granulocytes 0 %   Immature Grans (Abs) 0.0 0.0 - 0.1 x10E3/uL  PTT-LA Mix     Status: Abnormal   Collection Time: 05/28/15  9:44 AM  Result Value Ref Range   PTT-LA Mix 48.9 (H) 0.0 - 40.6 sec  dRVVT Mix      Status: Abnormal   Collection Time: 05/28/15  9:44 AM  Result Value Ref Range   dRVVT Mix 44.7 (H) 0.0 - 44.0 sec  Hexagonal Phase Phospholipid     Status: Abnormal   Collection Time: 05/28/15  9:44 AM  Result Value Ref Range   Hexagonal Phase Phospholipid 21 (H) 0 - 11 sec  dRVVT Confirm     Status: Abnormal   Collection Time: 05/28/15  9:44 AM  Result Value Ref Range   dRVVT Confirm 1.5 (H) 0.8 - 1.2 ratio  Basic metabolic panel     Status: None   Collection Time: 07/08/15  6:34 AM  Result Value Ref Range   Sodium 143 135 - 145 mmol/L   Potassium 3.9 3.5 - 5.1 mmol/L   Chloride 107 101 - 111 mmol/L   CO2 23 22 - 32 mmol/L   Glucose, Bld 95 65 - 99 mg/dL   BUN 8 6 - 20 mg/dL   Creatinine, Ser 0.71 0.44 - 1.00 mg/dL   Calcium 8.9 8.9 - 10.3 mg/dL   GFR calc non Af Amer >60 >60 mL/min   GFR calc Af Amer >60 >60 mL/min    Comment: (NOTE) The eGFR has been calculated using the CKD EPI equation. This calculation has not been validated in all clinical situations. eGFR's persistently <60 mL/min signify possible Chronic Kidney Disease.    Anion gap 13 5 - 15  CBC     Status: Abnormal   Collection Time: 07/08/15  6:34 AM  Result  Value Ref Range   WBC 8.1 4.0 - 10.5 K/uL   RBC 4.24 3.87 - 5.11 MIL/uL   Hemoglobin 12.1 12.0 - 15.0 g/dL   HCT 38.2 36.0 - 46.0 %   MCV 90.1 78.0 - 100.0 fL   MCH 28.5 26.0 - 34.0 pg   MCHC 31.7 30.0 - 36.0 g/dL   RDW 15.8 (H) 11.5 - 15.5 %   Platelets 303 150 - 400 K/uL  I-stat troponin, ED (not at [facility], [facility])     Status: None   Collection Time: 07/08/15  6:43 AM  Result Value Ref Range   Troponin i, poc 0.00 0.00 - 0.08 ng/mL   Comment 3            Comment: Due to the release kinetics of cTnI, a negative result within the first hours of the onset of symptoms does not rule out myocardial infarction with certainty. If myocardial infarction is still suspected, repeat the test at appropriate intervals.   Troponin I     Status: None   Collection  Time: 07/08/15 11:52 AM  Result Value Ref Range   Troponin I <0.03 <0.031 ng/mL    Comment:        NO INDICATION OF MYOCARDIAL INJURY.   CKMB([facility] only)     Status: None   Collection Time: 07/08/15 11:52 AM  Result Value Ref Range   CK, MB 1.3 0.5 - 5.0 ng/mL     PHQ2/9: Depression screen [facility] 2/9 07/09/2015 05/28/2015 03/11/2015  Decreased Interest 0 0 0  Down, Depressed, Hopeless 0 0 0  PHQ - 2 Score 0 0 0    Fall Risk: Fall Risk  07/09/2015 05/28/2015 03/11/2015  Falls in the past year? No No Yes  Number falls in past yr: - - 1  Injury with Fall? - - Yes     Functional Status Survey: Is the patient deaf or have difficulty hearing?: No Does the patient have difficulty seeing, even when wearing glasses/contacts?: No Does the patient have difficulty concentrating, remembering, or making decisions?: No Does the patient have difficulty walking or climbing stairs?: No Does the patient have difficulty dressing or bathing?: No Does the patient have difficulty doing errands alone such as visiting a doctor's office or shopping?: No    Assessment & Plan  1. Hypertension, benign  Continue HCTZ  2. Precordial pain  - Ambulatory referral to Cardiology  3. Paresthesia  Resolved, discussed consider seeing neurologist, continue aspirin daily   4. Headache disorder  Only take Tylenol for now  5. Epistaxis, recurrent  Resolved, it may have been secondary to nasal steroid

## 2015-07-10 ENCOUNTER — Other Ambulatory Visit: Payer: Self-pay

## 2015-07-10 MED ORDER — HYDROCHLOROTHIAZIDE 25 MG PO TABS: 25.0000 mg | ORAL_TABLET | Freq: Every day | ORAL | Status: DC

## 2015-07-10 NOTE — Telephone Encounter (Signed)
[facility] [address], sent to [facility] [place] - pharmacy on file. She can call them and ask to transfer the rx.

## 2015-07-10 NOTE — Telephone Encounter (Signed)
Patient stated that she cannot find her blood pressure pills. She went to the grocery and is not sure if she dropped them there or not. She wanted to know if she could get another rx called in. She is willing to pay for them out of pocket if necessary.  [address], [place], [place] [phone])

## 2015-07-10 NOTE — Telephone Encounter (Signed)
Called in new rx for Hydrochlorothiazide (Microzide) 25mg  # 30, 0 refills, [facility] daily[facility] [place].  Patient was informed.

## 2015-07-11 ENCOUNTER — Ambulatory Visit: Payer: 59 | Admitting: Family Medicine

## 2015-07-15 ENCOUNTER — Ambulatory Visit: Payer: 59

## 2015-07-31 ENCOUNTER — Ambulatory Visit (INDEPENDENT_AMBULATORY_CARE_PROVIDER_SITE_OTHER): Payer: 59 | Admitting: Cardiology

## 2015-07-31 ENCOUNTER — Encounter: Payer: Self-pay | Admitting: Cardiology

## 2015-07-31 VITALS — BP 110/80 | HR 80 | Ht 66.0 in | Wt 258.0 lb

## 2015-07-31 DIAGNOSIS — R079 Chest pain, unspecified: Secondary | ICD-10-CM | POA: Diagnosis not present

## 2015-07-31 DIAGNOSIS — R002 Palpitations: Secondary | ICD-10-CM

## 2015-07-31 DIAGNOSIS — R0600 Dyspnea, unspecified: Secondary | ICD-10-CM | POA: Diagnosis not present

## 2015-07-31 DIAGNOSIS — E669 Obesity, unspecified: Secondary | ICD-10-CM

## 2015-07-31 NOTE — Patient Instructions (Addendum)
Medication Instructions:  Your physician recommends that you continue on your current medications as directed. Please refer to the Current Medication list given to you today.   Labwork: None ordered  Testing/Procedures: Your physician has requested that you have an echocardiogram. Echocardiography is a painless test that uses sound waves to create images of your heart. It provides your doctor with information about the size and shape of your heart and how well your heart's chambers and valves are working. This procedure takes approximately one hour. There are no restrictions for this procedure.  Date & Time:_________________________________________________________________  Your physician has recommended that you wear an event monitor. Event monitors are medical devices that record the heart's electrical activity. Doctors most often [place] these monitors to diagnose arrhythmias. Arrhythmias are problems with the speed or rhythm of the heartbeat. The monitor is a small, portable device. You can wear one while you do your normal daily activities. This is usually used to diagnose what is causing palpitations/syncope (passing out).  Will be sent to you.  Your physician has requested that you have en exercise stress myoview. For further information please visit [email or web address] Please follow instruction sheet, as given.  [facility] MYOVIEW  Your caregiver has ordered a Stress Test with nuclear imaging. The purpose of this test is to evaluate the blood supply to your heart muscle. This procedure is referred to as a "Non-Invasive Stress Test." This is because other than having an IV started in your vein, nothing is inserted or "invades" your body. Cardiac stress tests are done to find areas of poor blood flow to the heart by determining the extent of coronary artery disease (CAD). Some patients exercise on a treadmill, which naturally increases the blood flow to your heart, while others who are  unable to walk  on a treadmill due to physical limitations have a pharmacologic/chemical stress agent called Lexiscan . This medicine will mimic walking on a treadmill by temporarily increasing your coronary blood flow.   Please note: these test may take anywhere between 2-4 hours to complete  PLEASE REPORT TO [facility] ENTRANCE  THE VOLUNTEERS AT THE FIRST DESK WILL DIRECT YOU WHERE TO GO  Date of Procedure:____Friday August 22, 2015 at 07:30 AM_______  Arrival Time for Procedure:__Arrive at 07:15AM___________    PLEASE NOTIFY THE OFFICE AT LEAST 24 HOURS IN ADVANCE IF YOU ARE UNABLE TO KEEP YOUR APPOINTMENT.  [phone] AND  PLEASE NOTIFY NUCLEAR MEDICINE AT [facility] AT LEAST 24 HOURS IN ADVANCE IF YOU ARE UNABLE TO KEEP YOUR APPOINTMENT. [phone]  How to prepare for your Myoview test:   Do not eat or drink after midnight  No caffeine for 24 hours prior to test  No smoking 24 hours prior to test.  Your medication may be taken with water.  If your doctor stopped a medication because of this test, do not take that medication.  Ladies, please do not wear dresses.  Skirts or pants are appropriate. Please wear a short sleeve shirt.  No perfume, cologne or lotion.  Wear comfortable walking shoes. No heels!            Follow-Up: Your physician recommends that you schedule a follow-up appointment after testing to review results.  Date & Time: ___________________________________________________________________   Any Other Special Instructions Will Be Listed Below (If Applicable).     If you need a refill on your cardiac medications before your next appointment, please call your pharmacy.  Echocardiogram An echocardiogram, or echocardiography, uses sound waves (ultrasound) to produce  an image of your heart. The echocardiogram is simple, painless, obtained within a short period of time, and offers valuable information to your health care provider. The images from an  echocardiogram can provide information such as:  Evidence of coronary artery disease (CAD).  Heart size.  Heart muscle function.  Heart valve function.  Aneurysm detection.  Evidence of a past heart attack.  Fluid buildup around the heart.  Heart muscle thickening.  Assess heart valve function. LET [facility] CARE PROVIDER KNOW ABOUT:  Any allergies you have.  All medicines you are taking, including vitamins, herbs, eye drops, creams, and over-the-counter medicines.  Previous problems you or members of your family have had with the use of anesthetics.  Any blood disorders you have.  Previous surgeries you have had.  Medical conditions you have.  Possibility of pregnancy, if this applies. BEFORE THE PROCEDURE  No special preparation is needed. Eat and drink normally.  PROCEDURE   In order to produce an image of your heart, gel will be applied to your chest and a wand-like tool (transducer) will be moved over your chest. The gel will help transmit the sound waves from the transducer. The sound waves will harmlessly bounce off your heart to allow the heart images to be captured in real-time motion. These images will then be recorded.  You may need an IV to receive a medicine that improves the quality of the pictures. AFTER THE PROCEDURE You may return to your normal schedule including diet, activities, and medicines, unless your health care provider tells you otherwise.   This information is not intended to replace advice given to you by your health care provider. Make sure you discuss any questions you have with your health care provider.   Document Released: 04/09/2000 Document Revised: 05/03/2014 Document Reviewed: 12/18/2012 Elsevier Interactive Patient Education 2016 Elsevier Inc.   Cardiac Event Monitoring A cardiac event monitor is a small recording device used to help detect abnormal heart rhythms (arrhythmias). The monitor is used to record heart rhythm when  noticeable symptoms such as the following occur:  Fast heartbeats (palpitations), such as heart racing or fluttering.  Dizziness.  Fainting or light-headedness.  Unexplained weakness. The monitor is wired to two electrodes placed on your chest. Electrodes are flat, sticky disks that attach to your skin. The monitor can be worn for up to 30 days. You will wear the monitor at all times, except when bathing.  HOW TO USE YOUR CARDIAC EVENT MONITOR A technician will prepare your chest for the electrode placement. The technician will show you how to place the electrodes, how to work the monitor, and how to replace the batteries. Take time to practice using the monitor before you leave the office. Make sure you understand how to send the information from the monitor to your health care provider. This requires a telephone with a landline, not a cell phone. You need to:  Wear your monitor at all times, except when you are in water:  Do not get the monitor wet.  Take the monitor off when bathing. Do not swim or use a hot tub with it on.  Keep your skin clean. Do not put body lotion or moisturizer on your chest.  Change the electrodes daily or any time they stop sticking to your skin. You might need to use tape to keep them on.  It is possible that your skin under the electrodes could become irritated. To keep this from happening, try to put the electrodes in  slightly different places on your chest. However, they must remain in the area under your left breast and in the upper right section of your chest.  Make sure the monitor is safely clipped to your clothing or in a location close to your body that your health care provider recommends.  Press the button to record when you feel symptoms of heart trouble, such as dizziness, weakness, light-headedness, palpitations, thumping, shortness of breath, unexplained weakness, or a fluttering or racing heart. The monitor is always on and records what happened  slightly before you pressed the button, so do not worry about being too late to get good information.  Keep a diary of your activities, such as walking, doing chores, and taking medicine. It is especially important to note what you were doing when you pushed the button to record your symptoms. This will help your health care provider determine what might be contributing to your symptoms. The information stored in your monitor will be reviewed by your health care provider alongside your diary entries.  Send the recorded information as recommended by your health care provider. It is important to understand that it will take some time for your health care provider to process the results.  Change the batteries as recommended by your health care provider. SEEK IMMEDIATE MEDICAL CARE IF:   You have chest pain.  You have extreme difficulty breathing or shortness of breath.  You develop a very fast heartbeat that persists.  You develop dizziness that does not go away.  You faint or constantly feel you are about to faint.   This information is not intended to replace advice given to you by your health care provider. Make sure you discuss any questions you have with your health care provider.   Document Released: 01/20/2008 Document Revised: 05/03/2014 Document Reviewed: 10/09/2012 Elsevier Interactive Patient Education 2016 [employer].    Cardiac Nuclear Scanning A cardiac nuclear scan is used to check your heart for problems, such as the following:  A portion of the heart is not getting enough blood.  Part of the heart muscle has died, which happens with a heart attack.  The heart wall is not working normally.  In this test, a radioactive dye (tracer) is injected into your bloodstream. After the tracer has traveled to your heart, a scanning device is used to measure how much of the tracer is absorbed by or distributed to various areas of your heart. LET [facility] CARE PROVIDER KNOW  ABOUT:  Any allergies you have.  All medicines you are taking, including vitamins, herbs, eye drops, creams, and over-the-counter medicines.  Previous problems you or members of your family have had with the use of anesthetics.  Any blood disorders you have.  Previous surgeries you have had.  Medical conditions you have.  RISKS AND COMPLICATIONS Generally, this is a safe procedure. However, as with any procedure, problems can occur. Possible problems include:   Serious chest pain.  Rapid heartbeat.  Sensation of warmth in your chest. This usually passes quickly. BEFORE THE PROCEDURE Ask your health care provider about changing or stopping your regular medicines. PROCEDURE This procedure is usually done at a hospital and takes 2-4 hours.  An IV tube is inserted into one of your veins.  Your health care provider will inject a small amount of radioactive tracer through the tube.  You will then wait for 20-40 minutes while the tracer travels through your bloodstream.  You will lie down on an exam table so images  of your heart can be taken. Images will be taken for about 15-20 minutes.  You will exercise on a treadmill or stationary bike. While you exercise, your heart activity will be monitored with an electrocardiogram (ECG), and your blood pressure will be checked.  If you are unable to exercise, you may be given a medicine to make your heart beat faster.  When blood flow to your heart has peaked, tracer will again be injected through the IV tube.  After 20-40 minutes, you will get back on the exam table and have more images taken of your heart.  When the procedure is over, your IV tube will be removed. AFTER THE PROCEDURE  You will likely be able to leave shortly after the test. Unless your health care provider tells you otherwise, you may return to your normal schedule, including diet, activities, and medicines.  Make sure you find out how and when you will get your  test results.   This information is not intended to replace advice given to you by your health care provider. Make sure you discuss any questions you have with your health care provider.   Document Released: 05/07/2004 Document Revised: 04/17/2013 Document Reviewed: 03/21/2013 Elsevier Interactive Patient Education [employer] Elsevier Inc.

## 2015-07-31 NOTE — Progress Notes (Signed)
Cardiology Office Note   Date:  07/31/2015   ID:  Sharon Owen, DOB Sep 13, 1965, MRN [id number]  Referring Doctor:  Ruel Favors, MD   Cardiologist:   Almond Lint, MD   Reason for consultation:  Chief Complaint  Patient presents with  . other    C/o chest pain and palpitations. Meds reviewed verbally with pt.       History of Present Illness: Sharon Owen is a 50 y.o. female who presents for Chest pain and palpitations.  In terms of chest pain, this is described as a sharp pain over on the center of the chest radiating down to the left shoulder. This is a moderate in intensity lasting minutes at a time, she feels is brought on by stress, this has been going on for a while now.   She also has shortness of breath with exertion. She feels is a has a lot to do with the weight gain but in the past she was able to do moderate physical activity like playing basketball or ball with her children but now even walking up a flight of stairs is very difficult for her and she gets really short of breath.   In terms of palpitations, she has this on and off not on a daily basis, again may be related to stress level at work. Somewhat better by avoiding caffeine intake. However still persistent.   ROS:  goes away spontaneously. Please see the history of present illness. Aside from mentioned under HPI, all other systems are reviewed and negative.     Past Medical History  Diagnosis Date  . Ankle fracture, right   . Morbid obesity (HCC)   . Chronic osteoarthritis   . Episcleritis of left eye   . Anxiety   . Metabolic syndrome   . GERD (gastroesophageal reflux disease)   . Insomnia   . Elevated blood pressure   . Hypertension     Past Surgical History  Procedure Laterality Date  . Chondroplasty  12/24/2011    Procedure: CHONDROPLASTY;  Surgeon: Vickki Hearing, MD;  Location: [facility];  Service: Orthopedics;  Laterality: Right;  . Tibia im nail insertion Right 05/23/2014      Procedure: INTRAMEDULLARY (IM) NAIL RIGHT TIBIA;  Surgeon: Harvie Junior, MD;  Location: [facility] OR;  Service: Orthopedics;  Laterality: Right;  . Cesarean section    . Leg surgery       reports that she has never smoked. She has never used smokeless tobacco. She reports that she drinks alcohol. She reports that she does not use illicit drugs.   family history includes Diabetes in her father and sister; Hypertension in her father, mother, and sister; Insomnia in her sister; Stroke in her father.   Current Outpatient Prescriptions  Medication Sig Dispense Refill  . ALPRAZolam (XANAX) 0.5 MG tablet TAKE HALF TO ONE TABLET BY MOUTH EVERY 4 HOURS AS NEEDED FOR ANXIETY 30 tablet 0  . aspirin EC 81 MG tablet Take 1 tablet (81 mg total) by mouth daily. 30 tablet 0  . Black Cohosh 200 MG CAPS Take 1 capsule (200 mg total) by mouth 2 (two) times daily. 60 capsule 0  . cyclobenzaprine (FLEXERIL) 10 MG tablet TAKE 1 TABLET (10 MG TOTAL) BY MOUTH EVERY 6 (SIX) HOURS AS NEEDED. 90 tablet 0  . diphenhydrAMINE (BENADRYL) 25 MG tablet Take 25 mg by mouth every 6 (six) hours as needed. Reported on 05/28/2015    . esomeprazole (NEXIUM) 40 MG  capsule Take 1 capsule (40 mg total) by mouth daily at 12 noon. Reported on 05/28/2015 30 capsule 5  . hydrochlorothiazide (HYDRODIURIL) 25 MG tablet Take 1 tablet (25 mg total) by mouth daily. 30 tablet 0  . Vitamin D, Ergocalciferol, (DRISDOL) 50000 units CAPS capsule Take 1 capsule (50,000 Units total) by mouth every 7 (seven) days. 12 capsule 0  . zolpidem (AMBIEN) 10 MG tablet Take 10 mg by mouth at bedtime.  2   No current facility-administered medications for this visit.    Allergies: Adhesive; Latex; Meloxicam; Gabapentin; and Morphine and related    PHYSICAL EXAM: VS:  BP 110/80 mmHg  Pulse 80  Ht 5\' 6"  (1.676 m)  Wt 258 lb (117.028 kg)  BMI 41.66 kg/m2 , Body mass index is 41.66 kg/(m^2). Wt Readings from Last 3 Encounters:  07/31/15 258 lb (117.028 kg)   07/09/15 256 lb 11.2 oz (116.438 kg)  07/08/15 261 lb 4 oz (118.502 kg)    GENERAL:  well developed, well nourished, obese, not in acute distress HEENT: normocephalic, pink conjunctivae, anicteric sclerae, no xanthelasma, normal dentition, oropharynx clear NECK:  no neck vein engorgement, JVP normal, no hepatojugular reflux, carotid upstroke brisk and symmetric, no bruit, no thyromegaly, no lymphadenopathy LUNGS:  good respiratory effort, clear to auscultation bilaterally CV:  PMI not displaced, no thrills, no lifts, S1 and S2 within normal limits, no palpable S3 or S4, no murmurs, no rubs, no gallops ABD:  Soft, nontender, nondistended, normoactive bowel sounds, no abdominal aortic bruit, no hepatomegaly, no splenomegaly MS: nontender back, no kyphosis, no scoliosis, no joint deformities EXT:  2+ DP/PT pulses, no edema, no varicosities, no cyanosis, no clubbing SKIN: warm, nondiaphoretic, normal turgor, no ulcers NEUROPSYCH: alert, oriented to person, place, and time, sensory/motor grossly intact, normal mood, appropriate affect  Recent Labs: 05/28/2015: ALT 10; TSH 1.630 07/08/2015: BUN 8; Creatinine, Ser 0.71; Hemoglobin 12.1; Platelets 303; Potassium 3.9; Sodium 143   Lipid Panel    Component Value Date/Time   CHOL 192 05/28/2015 0944   CHOL 178 12/27/2013   TRIG 79 05/28/2015 0944   HDL 49 05/28/2015 0944   HDL 52 12/27/2013   CHOLHDL 3.9 05/28/2015 0944   LDLCALC 127* 05/28/2015 0944   LDLCALC 111 12/27/2013     Other studies Reviewed:  EKG:  The ekg from 07/31/2015 was personally reviewed by me and it revealed sinus rhythm, 80 BPM, low voltage QRS, nonspecific T-wave changes.  additional studies/ records that were reviewed personally reviewed by me today include: None available    ASSESSMENT AND PLAN:  chest pain  Shortness of breath   chest pain is atypical. Patient has risk factors including hypertension, obesity. Patient would like to find out if this is cardiac  related. Recommend exercise nuclear stress test. Recommend echocardiogram   palpitations Not occurring on a daily basis Recommend cardiac monitor/event monitor for further evaluation  Obesity Body mass index is 41.66 kg/(m^2).[name]. Recommend aggressive weight loss through diet and increased physical activity (once stress this is done). We discussed heart healthy diet that is high in fiber, fruits and vegetables less of the red meat. Advised patient that she may look into Mediterranean diet.   Current medicines are reviewed at length with the patient today.  The patient does not have concerns regarding medicines.  Labs/ tests ordered today include:  Orders Placed This Encounter  Procedures  . NM Myocar Multi W/Spect W/Wall Motion / EF  . Cardiac event monitor  . ECHOCARDIOGRAM COMPLETE  I had a lengthy and detailed discussion with the patient regarding diagnoses, prognosis, diagnostic options, treatment options .[name]   I counseled the patient on importance of lifestyle modification including heart healthy diet, regular physical activity .[name]   Disposition:   FU with undersigned after tests  Signed, Almond Lint, MD  07/31/2015 12:49 PM    [facility]

## 2015-08-05 ENCOUNTER — Ambulatory Visit (INDEPENDENT_AMBULATORY_CARE_PROVIDER_SITE_OTHER): Payer: 59

## 2015-08-05 DIAGNOSIS — R079 Chest pain, unspecified: Secondary | ICD-10-CM

## 2015-08-05 DIAGNOSIS — R002 Palpitations: Secondary | ICD-10-CM

## 2015-08-05 DIAGNOSIS — R0600 Dyspnea, unspecified: Secondary | ICD-10-CM

## 2015-08-08 ENCOUNTER — Other Ambulatory Visit: Payer: Self-pay

## 2015-08-08 ENCOUNTER — Ambulatory Visit (INDEPENDENT_AMBULATORY_CARE_PROVIDER_SITE_OTHER): Payer: 59

## 2015-08-08 DIAGNOSIS — R079 Chest pain, unspecified: Secondary | ICD-10-CM | POA: Diagnosis not present

## 2015-08-08 DIAGNOSIS — R002 Palpitations: Secondary | ICD-10-CM | POA: Diagnosis not present

## 2015-08-08 DIAGNOSIS — R0600 Dyspnea, unspecified: Secondary | ICD-10-CM

## 2015-08-12 ENCOUNTER — Ambulatory Visit: Payer: 59 | Admitting: Family Medicine

## 2015-08-20 ENCOUNTER — Telehealth: Payer: Self-pay | Admitting: Cardiology

## 2015-08-20 NOTE — Telephone Encounter (Signed)
Pt calling stating she can't do stress test that was scheduled this week  Due to not being able to get off from work Please call back and reschedule with patient.

## 2015-08-20 NOTE — Telephone Encounter (Signed)
Left detailed voicemail message letting patient know that I canceled her stress test for Friday 08/22/15 and requested that she call back to reschedule.

## 2015-08-20 NOTE — Telephone Encounter (Signed)
Left detailed voicemail message with reminder and instructions for upcoming exercise myoview scheduled for Friday 08/22/15. Left instructions for patient to call back if any further questions.

## 2015-08-22 ENCOUNTER — Ambulatory Visit: Payer: 59

## 2015-08-22 NOTE — Telephone Encounter (Signed)
Left detailed voicemail message letting patient know that we need to reschedule her stress test prior to her follow up appointment with Dr. Alvino ChapelIngal. Instructed her to call back to schedule this testing and if she had any further questions.

## 2015-08-25 ENCOUNTER — Telehealth: Payer: Self-pay | Admitting: Cardiology

## 2015-08-25 NOTE — Telephone Encounter (Signed)
Patient cancelled nm [facility]. Do we need to r/s fu appt ? Please call patient if needing to r/s nm study prior to fu next week.

## 2015-08-25 NOTE — Telephone Encounter (Signed)
Left detailed voicemail message for patient regarding her upcoming follow up that we really need to reschedule after her stress test has been completed. Let her know to please call back so that we can get all of her appointments (follow up and stress test) rescheduled.

## 2015-08-26 ENCOUNTER — Ambulatory Visit: Payer: 59 | Admitting: Family Medicine

## 2015-08-28 ENCOUNTER — Ambulatory Visit: Payer: 59 | Admitting: Cardiology

## 2015-09-01 ENCOUNTER — Telehealth: Payer: Self-pay | Admitting: Cardiology

## 2015-09-01 NOTE — Telephone Encounter (Signed)
Left detailed voicemail message letting patient know that I would be canceling this appointment until she has her stress test done and to please call back if any further questions.

## 2015-09-02 ENCOUNTER — Telehealth: Payer: Self-pay | Admitting: Cardiology

## 2015-09-02 NOTE — Telephone Encounter (Signed)
Left detailed message informing pt that 5/11 appt was cancelled due to NM testing not performed. Also advised if pt would like to r/s these appt to call us back

## 2015-09-04 ENCOUNTER — Ambulatory Visit: Payer: 59 | Admitting: Cardiology

## 2015-10-06 ENCOUNTER — Encounter: Payer: Self-pay | Admitting: Family Medicine

## 2015-10-06 ENCOUNTER — Ambulatory Visit (INDEPENDENT_AMBULATORY_CARE_PROVIDER_SITE_OTHER): Payer: 59 | Admitting: Family Medicine

## 2015-10-06 VITALS — BP 128/82 | HR 85 | Temp 99.0°F | Resp 16 | Ht 66.0 in | Wt 262.2 lb

## 2015-10-06 DIAGNOSIS — M6249 Contracture of muscle, multiple sites: Secondary | ICD-10-CM | POA: Diagnosis not present

## 2015-10-06 DIAGNOSIS — R300 Dysuria: Secondary | ICD-10-CM

## 2015-10-06 DIAGNOSIS — G4700 Insomnia, unspecified: Secondary | ICD-10-CM | POA: Diagnosis not present

## 2015-10-06 DIAGNOSIS — R072 Precordial pain: Secondary | ICD-10-CM | POA: Diagnosis not present

## 2015-10-06 DIAGNOSIS — Z1211 Encounter for screening for malignant neoplasm of colon: Secondary | ICD-10-CM

## 2015-10-06 DIAGNOSIS — I10 Essential (primary) hypertension: Secondary | ICD-10-CM | POA: Diagnosis not present

## 2015-10-06 DIAGNOSIS — M62838 Other muscle spasm: Secondary | ICD-10-CM

## 2015-10-06 DIAGNOSIS — Z1239 Encounter for other screening for malignant neoplasm of breast: Secondary | ICD-10-CM | POA: Diagnosis not present

## 2015-10-06 LAB — POCT URINALYSIS DIPSTICK
Bilirubin, UA: NEGATIVE
Blood, UA: NEGATIVE
Glucose, UA: NEGATIVE
Ketones, UA: NEGATIVE
Leukocytes, UA: NEGATIVE
Nitrite, UA: NEGATIVE
Protein, UA: NEGATIVE
Spec Grav, UA: 1.015
Urobilinogen, UA: NEGATIVE
pH, UA: 6

## 2015-10-06 MED ORDER — LIRAGLUTIDE -WEIGHT MANAGEMENT 18 MG/3ML ~~LOC~~ SOPN: 3.0000 mg | PEN_INJECTOR | Freq: Every day | SUBCUTANEOUS | Status: DC

## 2015-10-06 MED ORDER — CYCLOBENZAPRINE HCL 10 MG PO TABS: 10.0000 mg | ORAL_TABLET | Freq: Every day | ORAL | Status: DC

## 2015-10-06 MED ORDER — HYDROCHLOROTHIAZIDE 12.5 MG PO TABS: 12.5000 mg | ORAL_TABLET | Freq: Every day | ORAL | Status: DC

## 2015-10-06 MED ORDER — ZOLPIDEM TARTRATE 10 MG PO TABS: 10.0000 mg | ORAL_TABLET | Freq: Every day | ORAL | Status: DC

## 2015-10-06 MED ORDER — CIPROFLOXACIN HCL 250 MG PO TABS: 250.0000 mg | ORAL_TABLET | Freq: Two times a day (BID) | ORAL | Status: DC

## 2015-10-06 NOTE — Addendum Note (Signed)
Addended by: Cynda FamiliaJOHNSON, Jettie Lazare L on: 10/06/2015 05:18 PM   Modules accepted: Medications

## 2015-10-06 NOTE — Progress Notes (Signed)
Name: Sharon Owen   MRN: [id number]    DOB: 11/28/1965   Date:10/06/2015       Progress Note  Subjective  Chief Complaint  Chief Complaint  Patient presents with  . Hypertension    patient is here for f/u  . Urinary Tract Infection  . Obesity    wants ot try Saxenda    HPI  Dysuria: she noticed dysuria when she first starts to void, symptoms started 2 days ago. She states started after she took a bubble baths. She denies hematuria, urinary frequency, or fever or back pain. She was on vacation and had more intercourse also.  HTN: doing well, taking HCTZ 25 mg every other day, we will try changing to 12.5 mg but to take daily, denies SOB or chest pain  Insomnia: taking Ambien, she states usually sleeps well, but not over the past couple of nights. No side effects of medications  Obesity: she states she has been obesity since she turned 50 years old. She was in the 160's lbs before she had children. She has tried weight watchers, Adipex, Qsymia ( raised her bp ), Atkins diet. She is able to lose weight, but unable to keep weight off. She is currently eating lean meat, increasing vegetables and juicing. She would like to try Saxenda. Today's weight is 262 lbs and needs to lose 13 lbs in 3 months.   Muscles spasms: doing well on Flexeril qhs, no side effects. Usually on her legs and back.    Patient Active Problem List   Diagnosis Date Noted  . Elevated C-reactive protein 07/08/2015  . Perimenopause 05/28/2015  . Morbid obesity (HCC) 03/11/2015  . Gastro-esophageal reflux disease without esophagitis 03/11/2015  . Anxiety 03/11/2015  . Insomnia, persistent 03/11/2015  . Osteoarthritis of both knees 03/11/2015  . Episcleritis 03/11/2015  . History of fracture of tibia 05/23/2014  . History of sprain of ankle 05/23/2014  . S/P right knee arthroscopy 12/28/2011  . Medial meniscus, posterior horn derangement 12/01/2011  . Mononeuritis leg 07/17/2011  . Night muscle spasms  07/17/2011    Past Surgical History  Procedure Laterality Date  . Chondroplasty  12/24/2011    Procedure: CHONDROPLASTY;  Surgeon: Vickki Hearing, MD;  Location: [facility];  Service: Orthopedics;  Laterality: Right;  . Tibia im nail insertion Right 05/23/2014    Procedure: INTRAMEDULLARY (IM) NAIL RIGHT TIBIA;  Surgeon: Harvie Junior, MD;  Location: [facility];  Service: Orthopedics;  Laterality: Right;  . Cesarean section    . Leg surgery      Family History  Problem Relation Age of Onset  . Hypertension Mother   . Hypertension Father   . Diabetes Father   . Stroke Father   . Insomnia Sister   . Hypertension Sister   . Diabetes Sister     Social History   Social History  . Marital Status: Single    Spouse Name: N/A  . Number of Children: N/A  . Years of Education: N/A   Occupational History  . Not on file.   Social History Main Topics  . Smoking status: Never Smoker   . Smokeless tobacco: Never Used  . Alcohol Use: 0.0 oz/week    0 Standard drinks or equivalent per week     Comment: occ  . Drug Use: No  . Sexual Activity: Yes   Other Topics Concern  . Not on file   Social History Narrative     Current outpatient prescriptions:  .  ALPRAZolam (XANAX) 0.5 MG tablet, TAKE HALF TO ONE TABLET BY MOUTH EVERY 4 HOURS AS NEEDED FOR ANXIETY, Disp: 30 tablet, Rfl: 0 .  aspirin EC 81 MG tablet, Take 1 tablet (81 mg total) by mouth daily., Disp: 30 tablet, Rfl: 0 .  Black Cohosh 200 MG CAPS, Take 1 capsule (200 mg total) by mouth 2 (two) times daily., Disp: 60 capsule, Rfl: 0 .  ciprofloxacin (CIPRO) 250 MG tablet, Take 1 tablet (250 mg total) by mouth 2 (two) times daily., Disp: 6 tablet, Rfl: 0 .  cyclobenzaprine (FLEXERIL) 10 MG tablet, Take 1 tablet (10 mg total) by mouth at bedtime., Disp: 90 tablet, Rfl: 0 .  diphenhydrAMINE (BENADRYL) 25 MG tablet, Take 25 mg by mouth every 6 (six) hours as needed. Reported on 05/28/2015, Disp: , Rfl:  .  esomeprazole (NEXIUM) 40 MG  capsule, Take 1 capsule (40 mg total) by mouth daily at 12 noon. Reported on 05/28/2015, Disp: 30 capsule, Rfl: 5 .  hydrochlorothiazide (HYDRODIURIL) 12.5 MG tablet, Take 1 tablet (12.5 mg total) by mouth daily., Disp: 30 tablet, Rfl: 2 .  Liraglutide -Weight Management (SAXENDA) 18 MG/3ML SOPN, Inject 3 mg into the skin daily. Titrate up weekly from 0.6,1.2,1.8,2.4 and stay at 3.0 daily, Disp: 12 mL, Rfl: 0 .  Vitamin D, Ergocalciferol, (DRISDOL) 50000 units CAPS capsule, Take 1 capsule (50,000 Units total) by mouth every 7 (seven) days., Disp: 12 capsule, Rfl: 0 .  zolpidem (AMBIEN) 10 MG tablet, Take 1 tablet (10 mg total) by mouth at bedtime., Disp: 30 tablet, Rfl: 2  Allergies  Allergen Reactions  . Adhesive [Tape] Itching  . Latex Itching  . Meloxicam     Elevated BP  . Gabapentin Rash  . Morphine And Related Rash     ROS  Constitutional: Negative for fever , positive for weight change.  Respiratory: Negative for cough and shortness of breath.   Cardiovascular: Negative for chest pain or palpitations.  Gastrointestinal: Negative for abdominal pain, no bowel changes.  Musculoskeletal: Negative for gait problem or joint swelling.  Skin: Negative for rash.  Neurological: Negative for dizziness or headache.  No other specific complaints in a complete review of systems (except as listed in HPI above).  Objective  Filed Vitals:   10/06/15 1335  BP: 128/82  Pulse: 85  Temp: 99 F (37.2 C)  TempSrc: Oral  Resp: 16  Height: 5\' 6"  (1.676 m)  Weight: 262 lb 3.2 oz (118.933 kg)  SpO2: 94%    Body mass index is 42.34 kg/(m^2).  Physical Exam  Constitutional: Patient appears well-developed and well-nourished. Obese No distress.  HEENT: head atraumatic, normocephalic, pupils equal and reactive to light, neck supple, throat within normal limits Cardiovascular: Normal rate, regular rhythm and normal heart sounds.  No murmur heard. No BLE edema. Pulmonary/Chest: Effort normal and  breath sounds normal. No respiratory distress. Abdominal: Soft.  There is no tenderness. Psychiatric: Patient has a normal mood and affect. behavior is normal. Judgment and thought content normal.  Recent Results (from the past 2160 hour(s))  POCT urinalysis dipstick     Status: Normal   Collection Time: 10/06/15  1:38 PM  Result Value Ref Range   Color, UA dark yellow    Clarity, UA clear    Glucose, UA neg    Bilirubin, UA neg    Ketones, UA neg    Spec Grav, UA 1.015    Blood, UA neg    pH, UA 6.0    Protein, UA  neg    Urobilinogen, UA negative    Nitrite, UA neg    Leukocytes, UA Negative Negative      PHQ2/9: [facility] 10/06/2015 07/09/2015 05/28/2015 03/11/2015  Decreased Interest 0 0 0 0  Down, Depressed, Hopeless 0 0 0 0  PHQ - 2 Score 0 0 0 0     Fall Risk: Fall Risk  10/06/2015 07/09/2015 05/28/2015 03/11/2015  Falls in the past year? No No No Yes  Number falls in past yr: - - - 1  Injury with Fall? - - - Yes     Functional Status Survey: Is the patient deaf or have difficulty hearing?: No Does the patient have difficulty seeing, even when wearing glasses/contacts?: No Does the patient have difficulty concentrating, remembering, or making decisions?: No Does the patient have difficulty walking or climbing stairs?: No Does the patient have difficulty dressing or bathing?: No Does the patient have difficulty doing errands alone such as visiting a doctor's office or shopping?: No   Assessment & Plan  1. Dysuria  - POCT urinalysis dipstick - Urine culture - ciprofloxacin (CIPRO) 250 MG tablet; Take 1 tablet (250 mg total) by mouth 2 (two) times daily.  Dispense: 6 tablet; Refill: 0  2. Morbid obesity, unspecified obesity type (HCC)  - Liraglutide -Weight Management (SAXENDA) 18 MG/3ML SOPN; Inject 3 mg into the skin daily. Titrate up weekly from 0.6,1.2,1.8,2.4 and stay at 3.0 daily  Dispense: 12 mL; Refill: 0  3. Precordial pain  Seen by  Cardiologist, symptoms resolved - supposed to have stress test but did not make appointment   4. Hypertension, benign  - hydrochlorothiazide (HYDRODIURIL) 12.5 MG tablet; Take 1 tablet (12.5 mg total) by mouth daily.  Dispense: 30 tablet; Refill: 2  5. Insomnia  - zolpidem (AMBIEN) 10 MG tablet; Take 1 tablet (10 mg total) by mouth at bedtime.  Dispense: 30 tablet; Refill: 2  6. Night muscle spasms  - cyclobenzaprine (FLEXERIL) 10 MG tablet; Take 1 tablet (10 mg total) by mouth at bedtime.  Dispense: 90 tablet; Refill: 0  7. Breast cancer screening  - MM Digital Screening; Future  8. Colon cancer screening  - Ambulatory referral to Gastroenterology

## 2015-10-07 LAB — URINE CULTURE

## 2015-10-13 ENCOUNTER — Other Ambulatory Visit: Payer: Self-pay

## 2015-10-13 ENCOUNTER — Telehealth: Payer: Self-pay | Admitting: Family Medicine

## 2015-10-13 MED ORDER — INSULIN PEN NEEDLE 32G X 6 MM MISC: 1.0000 | Freq: Every day | Status: DC

## 2015-10-13 NOTE — Telephone Encounter (Signed)
Patient requesting refill. 

## 2015-10-13 NOTE — Telephone Encounter (Signed)
Requesting a return call. Wanting to make sure that the saxenda samples will be ready so she can pick them up around lunch. Also, states insurance will not cover the medication and would like to know if she would be able to continue with samples because the medication is really working. She has lost 8 pounds within one week.

## 2015-10-13 NOTE — Telephone Encounter (Signed)
Informed patient her insurance denied the Saxenda and we are only able to get a limited amount of sample. Patient is going to call her Insurance and see what she can do due to patient losing 8 pounds and feeling much more energetic.

## 2015-10-16 ENCOUNTER — Telehealth: Payer: Self-pay | Admitting: Family Medicine

## 2015-10-16 MED ORDER — LIRAGLUTIDE 18 MG/3ML ~~LOC~~ SOPN: 1.8000 mg | PEN_INJECTOR | Freq: Every day | SUBCUTANEOUS | Status: DC

## 2015-10-16 NOTE — Telephone Encounter (Signed)
Patient states that her insurance will not cover saxenda but will cover victoza

## 2015-10-16 NOTE — Telephone Encounter (Signed)
Victoza is for DM, not for obesity. I can try sending it for obesity

## 2015-11-19 ENCOUNTER — Encounter: Payer: Self-pay | Admitting: Family Medicine

## 2015-11-19 ENCOUNTER — Ambulatory Visit (INDEPENDENT_AMBULATORY_CARE_PROVIDER_SITE_OTHER): Payer: 59 | Admitting: Family Medicine

## 2015-11-19 DIAGNOSIS — R7982 Elevated C-reactive protein (CRP): Secondary | ICD-10-CM

## 2015-11-19 DIAGNOSIS — R790 Abnormal level of blood mineral: Secondary | ICD-10-CM

## 2015-11-19 DIAGNOSIS — F419 Anxiety disorder, unspecified: Secondary | ICD-10-CM

## 2015-11-19 DIAGNOSIS — E8881 Metabolic syndrome: Secondary | ICD-10-CM | POA: Insufficient documentation

## 2015-11-19 DIAGNOSIS — I10 Essential (primary) hypertension: Secondary | ICD-10-CM

## 2015-11-19 DIAGNOSIS — R760 Raised antibody titer: Secondary | ICD-10-CM

## 2015-11-19 MED ORDER — ALPRAZOLAM 0.5 MG PO TABS: ORAL_TABLET | ORAL | 0 refills | Status: DC

## 2015-11-19 NOTE — Progress Notes (Signed)
Name: Sharon Owen   MRN: [id number]    DOB: 1966/01/31   Date:11/19/2015       Progress Note  Subjective  Chief Complaint  Chief Complaint  Patient presents with  . Follow-up    6 week F/U Obesity, would like a another Vitamin D draw to see where her levels are.  . Obesity    Patient states she has lost 21 pounds since being on Victoza, but can tell a difference between Saxenda. Patient goal is to lose 25 pounds more before October and is exercising with it.   . Anxiety    Patient needs a refill    HPI  HTN: she is not taking bp medication for the past 2 weeks, losing weight and bp at home has been well controlled. Avoiding caffeine and exercising more.   Insomnia: taking Ambien, she states usually sleeps well. No side effects of medications, but explained FDA guidelines and the need to try weaning down on dose to 5 mg daily   Obesity: she states she has been obesity since she turned 50 years old. She was in the 160's lbs before she had children. She has tried weight watchers, Adipex, Qsymia ( raised her bp ), Atkins diet.  She is currently eating lean meat, increasing vegetables and juicing. She was seen 09/2015 weight is 262 lbs, we gave her Bernie Covey and it worked for her, but insurance denied coverage, but has been covering Victoza for pre-diabetes. She states medication is curbing her appetite, she is eating a high protein diet, only having 3 meals daily and is wearing a fitbit. Average steps 10000 daily. She has lost 21 lbs since started on medication.   Metabolic Syndrome: she is doing well on Victoza, no polyphagia, polydipsia or polyuria on medication, last hgbA1C was 5.9% in Feb 2017  Abnormal labs/elevated ANA, and CRP: she has a history of episcleritis and had positive tests, she initially refused to see Rheumatologist. She has fatigue, she aches all over.   Patient Active Problem List   Diagnosis Date Noted  . Elevated C-reactive protein 07/08/2015  . Perimenopause  05/28/2015  . Morbid obesity (HCC) 03/11/2015  . Gastro-esophageal reflux disease without esophagitis 03/11/2015  . Anxiety 03/11/2015  . Insomnia, persistent 03/11/2015  . Osteoarthritis of both knees 03/11/2015  . Episcleritis 03/11/2015  . History of fracture of tibia 05/23/2014  . History of sprain of ankle 05/23/2014  . S/P right knee arthroscopy 12/28/2011  . Medial meniscus, posterior horn derangement 12/01/2011  . Mononeuritis leg 07/17/2011  . Night muscle spasms 07/17/2011    Past Surgical History:  Procedure Laterality Date  . CESAREAN SECTION    . CHONDROPLASTY  12/24/2011   Procedure: CHONDROPLASTY;  Surgeon: Vickki Hearing, MD;  Location: [facility];  Service: Orthopedics;  Laterality: Right;  . LEG SURGERY    . TIBIA IM NAIL INSERTION Right 05/23/2014   Procedure: INTRAMEDULLARY (IM) NAIL RIGHT TIBIA;  Surgeon: Harvie Junior, MD;  Location: [facility];  Service: Orthopedics;  Laterality: Right;    Family History  Problem Relation Age of Onset  . Hypertension Mother   . Hypertension Father   . Diabetes Father   . Stroke Father   . Insomnia Sister   . Hypertension Sister   . Diabetes Sister     Social History   Social History  . Marital status: Single    Spouse name: N/A  . Number of children: N/A  . Years of education: N/A   Occupational History  .  Not on file.   Social History Main Topics  . Smoking status: Never Smoker  . Smokeless tobacco: Never Used  . Alcohol use 0.0 oz/week     Comment: occ  . Drug use: No  . Sexual activity: Yes   Other Topics Concern  . Not on file   Social History Narrative  . No narrative on file     Current Outpatient Prescriptions:  .  ALPRAZolam (XANAX) 0.5 MG tablet, TAKE HALF TO ONE TABLET BY MOUTH EVERY 4 HOURS AS NEEDED FOR ANXIETY, Disp: 30 tablet, Rfl: 0 .  aspirin EC 81 MG tablet, Take 1 tablet (81 mg total) by mouth daily., Disp: 30 tablet, Rfl: 0 .  Black Cohosh 200 MG CAPS, Take 1 capsule (200 mg total)  by mouth 2 (two) times daily., Disp: 60 capsule, Rfl: 0 .  cyclobenzaprine (FLEXERIL) 10 MG tablet, Take 1 tablet (10 mg total) by mouth at bedtime., Disp: 90 tablet, Rfl: 0 .  diphenhydrAMINE (BENADRYL) 25 MG tablet, Take 25 mg by mouth every 6 (six) hours as needed. Reported on 05/28/2015, Disp: , Rfl:  .  esomeprazole (NEXIUM) 40 MG capsule, Take 1 capsule (40 mg total) by mouth daily at 12 noon. Reported on 05/28/2015, Disp: 30 capsule, Rfl: 5 .  Insulin Pen Needle 32G X 6 MM MISC, 1 each by Does not apply route daily., Disp: 100 each, Rfl: 0 .  Liraglutide 18 MG/3ML SOPN, Inject 0.3 mLs (1.8 mg total) into the skin daily., Disp: 6 mL, Rfl: 2 .  zolpidem (AMBIEN) 10 MG tablet, Take 1 tablet (10 mg total) by mouth at bedtime., Disp: 30 tablet, Rfl: 2  Allergies  Allergen Reactions  . Adhesive [Tape] Itching  . Latex Itching  . Meloxicam     Elevated BP  . Gabapentin Rash  . Morphine And Related Rash     ROS  Constitutional: Negative for fever, positive for  weight change.  Respiratory: Negative for cough and shortness of breath.   Cardiovascular: Negative for chest pain , very seldom she has  Palpitations ( improved ).  Gastrointestinal: Negative for abdominal pain, no bowel changes.  Musculoskeletal: Negative for gait problem or joint swelling.  Skin: Negative for rash.  Neurological: Negative for dizziness or headache.  No other specific complaints in a complete review of systems (except as listed in HPI above).  Objective  Vitals:   11/19/15 0905  BP: 128/86  Pulse: 90  Resp: 18  Temp: 98.2 F (36.8 C)  TempSrc: Oral  SpO2: 95%  Weight: 241 lb (109.3 kg)  Height: 5\' 6"  (1.676 m)    Body mass index is 38.9 kg/m.  Physical Exam  Constitutional: Patient appears well-developed and well-nourished. Obese  No distress.  HEENT: head atraumatic, normocephalic, pupils equal and reactive to light, neck supple, throat within normal limits Cardiovascular: Normal rate, regular  rhythm and normal heart sounds.  No murmur heard. No BLE edema. Pulmonary/Chest: Effort normal and breath sounds normal. No respiratory distress. Abdominal: Soft.  There is no tenderness. Psychiatric: Patient has a normal mood and affect. behavior is normal. Judgment and thought content normal.  Recent Results (from the past 2160 hour(s))  Urine culture     Status: None   Collection Time: 10/06/15 12:00 AM  Result Value Ref Range   Urine Culture, Routine Final report    Urine Culture result 1 Comment     Comment: Mixed urogenital flora Less than 10,000 colonies/mL   POCT urinalysis dipstick  Status: Normal   Collection Time: 10/06/15  1:38 PM  Result Value Ref Range   Color, UA dark yellow    Clarity, UA clear    Glucose, UA neg    Bilirubin, UA neg    Ketones, UA neg    Spec Grav, UA 1.015    Blood, UA neg    pH, UA 6.0    Protein, UA neg    Urobilinogen, UA negative    Nitrite, UA neg    Leukocytes, UA Negative Negative      PHQ2/9: Depression screen [facility] 2/9 10/06/2015 07/09/2015 05/28/2015 03/11/2015  Decreased Interest 0 0 0 0  Down, Depressed, Hopeless 0 0 0 0  PHQ - 2 Score 0 0 0 0     Fall Risk: Fall Risk  10/06/2015 07/09/2015 05/28/2015 03/11/2015  Falls in the past year? No No No Yes  Number falls in past yr: - - - 1  Injury with Fall? - - - Yes     Assessment & Plan  1. Morbid obesity, unspecified obesity type (HCC)  Continue Victoza but we will try PA for Saxenda  2. Hypertension, benign  On life style modification at this time, off HCTZ  3. Anxiety  She is taking Alprazolam very seldom now - ALPRAZolam (XANAX) 0.5 MG tablet; TAKE HALF TO ONE TABLET BY MOUTH EVERY 4 HOURS AS NEEDED FOR ANXIETY  Dispense: 30 tablet; Refill: 0  4. Metabolic syndrome  On Victoza and is responding well   5. Elevated C-reactive protein  - Ambulatory referral to Rheumatology  6. Lupus anticoagulant positive  - Ambulatory referral to Rheumatology

## 2016-01-16 ENCOUNTER — Ambulatory Visit: Payer: 59 | Admitting: Family Medicine

## 2016-01-22 ENCOUNTER — Telehealth: Payer: Self-pay | Admitting: Family Medicine

## 2016-01-22 NOTE — Telephone Encounter (Signed)
Pt notified and scheduled appt in nov

## 2016-03-17 ENCOUNTER — Ambulatory Visit: Payer: 59 | Admitting: Family Medicine

## 2016-06-18 ENCOUNTER — Telehealth: Payer: Self-pay | Admitting: Family Medicine

## 2016-06-18 DIAGNOSIS — G4700 Insomnia, unspecified: Secondary | ICD-10-CM

## 2016-06-21 ENCOUNTER — Other Ambulatory Visit: Payer: Self-pay | Admitting: Family Medicine

## 2016-06-21 MED ORDER — ZOLPIDEM TARTRATE 10 MG PO TABS: 10.0000 mg | ORAL_TABLET | Freq: Every day | ORAL | 0 refills | Status: DC

## 2016-06-21 NOTE — Telephone Encounter (Signed)
Pt has scheduled appointment for 08-19-16 and states that she is not able to get in before then due to work. She is asking that you please at least give her 15 pills.

## 2016-06-21 NOTE — Telephone Encounter (Signed)
Okay. We will fax 15

## 2016-06-24 ENCOUNTER — Observation Stay (HOSPITAL_COMMUNITY)
Admission: EM | Admit: 2016-06-24 | Discharge: 2016-06-25 | Disposition: A | Discharge status: Home / Self Care | Payer: 59 | Attending: Student in an Organized Health Care Education/Training Program | Admitting: Student in an Organized Health Care Education/Training Program

## 2016-06-24 ENCOUNTER — Emergency Department (HOSPITAL_COMMUNITY): Payer: 59

## 2016-06-24 ENCOUNTER — Encounter (HOSPITAL_COMMUNITY): Payer: Self-pay

## 2016-06-24 DIAGNOSIS — Z833 Family history of diabetes mellitus: Secondary | ICD-10-CM

## 2016-06-24 DIAGNOSIS — F419 Anxiety disorder, unspecified: Secondary | ICD-10-CM | POA: Diagnosis not present

## 2016-06-24 DIAGNOSIS — E669 Obesity, unspecified: Secondary | ICD-10-CM | POA: Diagnosis not present

## 2016-06-24 DIAGNOSIS — R51 Headache: Secondary | ICD-10-CM | POA: Insufficient documentation

## 2016-06-24 DIAGNOSIS — Z794 Long term (current) use of insulin: Secondary | ICD-10-CM | POA: Diagnosis not present

## 2016-06-24 DIAGNOSIS — Z91048 Other nonmedicinal substance allergy status: Secondary | ICD-10-CM | POA: Diagnosis not present

## 2016-06-24 DIAGNOSIS — Z91040 Latex allergy status: Secondary | ICD-10-CM | POA: Diagnosis not present

## 2016-06-24 DIAGNOSIS — Z888 Allergy status to other drugs, medicaments and biological substances status: Secondary | ICD-10-CM | POA: Diagnosis not present

## 2016-06-24 DIAGNOSIS — E876 Hypokalemia: Secondary | ICD-10-CM | POA: Insufficient documentation

## 2016-06-24 DIAGNOSIS — R0789 Other chest pain: Secondary | ICD-10-CM | POA: Diagnosis not present

## 2016-06-24 DIAGNOSIS — E785 Hyperlipidemia, unspecified: Secondary | ICD-10-CM

## 2016-06-24 DIAGNOSIS — Z8249 Family history of ischemic heart disease and other diseases of the circulatory system: Secondary | ICD-10-CM | POA: Diagnosis not present

## 2016-06-24 DIAGNOSIS — R519 Headache, unspecified: Secondary | ICD-10-CM

## 2016-06-24 DIAGNOSIS — R079 Chest pain, unspecified: Secondary | ICD-10-CM | POA: Diagnosis not present

## 2016-06-24 DIAGNOSIS — Z7982 Long term (current) use of aspirin: Secondary | ICD-10-CM | POA: Diagnosis not present

## 2016-06-24 DIAGNOSIS — I493 Ventricular premature depolarization: Secondary | ICD-10-CM | POA: Diagnosis not present

## 2016-06-24 DIAGNOSIS — Z79899 Other long term (current) drug therapy: Secondary | ICD-10-CM | POA: Insufficient documentation

## 2016-06-24 DIAGNOSIS — R002 Palpitations: Secondary | ICD-10-CM | POA: Diagnosis not present

## 2016-06-24 DIAGNOSIS — Z791 Long term (current) use of non-steroidal anti-inflammatories (NSAID): Secondary | ICD-10-CM | POA: Insufficient documentation

## 2016-06-24 DIAGNOSIS — K219 Gastro-esophageal reflux disease without esophagitis: Secondary | ICD-10-CM | POA: Diagnosis not present

## 2016-06-24 DIAGNOSIS — I161 Hypertensive emergency: Secondary | ICD-10-CM | POA: Diagnosis present

## 2016-06-24 DIAGNOSIS — Z885 Allergy status to narcotic agent status: Secondary | ICD-10-CM | POA: Diagnosis not present

## 2016-06-24 DIAGNOSIS — I10 Essential (primary) hypertension: Principal | ICD-10-CM | POA: Insufficient documentation

## 2016-06-24 HISTORY — DX: Unspecified episcleritis, right eye: H15.101

## 2016-06-24 HISTORY — DX: Adverse effect of unspecified anesthetic, initial encounter: T41.45XA

## 2016-06-24 HISTORY — DX: Headache: R51

## 2016-06-24 HISTORY — DX: Headache, unspecified: R51.9

## 2016-06-24 HISTORY — DX: Other complications of anesthesia, initial encounter: T88.59XA

## 2016-06-24 LAB — I-STAT TROPONIN, ED: Troponin i, poc: 0 ng/mL (ref 0.00–0.08)

## 2016-06-24 LAB — BASIC METABOLIC PANEL
Anion gap: 7 (ref 5–15)
BUN: 16 mg/dL (ref 6–20)
CO2: 28 mmol/L (ref 22–32)
Calcium: 9.3 mg/dL (ref 8.9–10.3)
Chloride: 104 mmol/L (ref 101–111)
Creatinine, Ser: 0.76 mg/dL (ref 0.44–1.00)
GFR calc Af Amer: >60 mL/min (ref >60)
GFR calc non Af Amer: >60 mL/min (ref >60)
Glucose, Bld: 102 mg/dL — ABNORMAL HIGH (ref 65–99)
Potassium: 3.7 mmol/L (ref 3.5–5.1)
Sodium: 139 mmol/L (ref 135–145)

## 2016-06-24 LAB — TROPONIN I: Troponin I: <0.03 ng/mL (ref <0.03)

## 2016-06-24 LAB — CBC
HCT: 40.9 % (ref 36.0–46.0)
Hemoglobin: 13.3 g/dL (ref 12.0–15.0)
MCH: 29.5 pg (ref 26.0–34.0)
MCHC: 32.5 g/dL (ref 30.0–36.0)
MCV: 90.7 fL (ref 78.0–100.0)
Platelets: 299 10*3/uL (ref 150–400)
RBC: 4.51 MIL/uL (ref 3.87–5.11)
RDW: 14.8 % (ref 11.5–15.5)
WBC: 10 10*3/uL (ref 4.0–10.5)

## 2016-06-24 LAB — COMPREHENSIVE METABOLIC PANEL
ALT: 14 U/L (ref 14–54)
AST: 20 U/L (ref 15–41)
Albumin: 3.9 g/dL (ref 3.5–5.0)
Alkaline Phosphatase: 94 U/L (ref 38–126)
Anion gap: 10 (ref 5–15)
BUN: 13 mg/dL (ref 6–20)
CO2: 29 mmol/L (ref 22–32)
Calcium: 9.5 mg/dL (ref 8.9–10.3)
Chloride: 100 mmol/L — ABNORMAL LOW (ref 101–111)
Creatinine, Ser: 0.75 mg/dL (ref 0.44–1.00)
GFR calc Af Amer: >60 mL/min (ref >60)
GFR calc non Af Amer: >60 mL/min (ref >60)
Glucose, Bld: 99 mg/dL (ref 65–99)
Potassium: 3.4 mmol/L — ABNORMAL LOW (ref 3.5–5.1)
Sodium: 139 mmol/L (ref 135–145)
Total Bilirubin: 0.8 mg/dL (ref 0.3–1.2)
Total Protein: 7.5 g/dL (ref 6.5–8.1)

## 2016-06-24 LAB — D-DIMER, QUANTITATIVE (NOT AT ARMC): D-Dimer, Quant: 0.34 ug/mL-FEU (ref 0.00–0.50)

## 2016-06-24 MED ORDER — NITROGLYCERIN 2 % TD OINT
1.0000 [in_us] | TOPICAL_OINTMENT | Freq: Once | TRANSDERMAL | Status: AC
  Administered 2016-06-24: 1 [in_us] via TOPICAL
  Filled 2016-06-24: qty 1

## 2016-06-24 MED ORDER — HEPARIN SODIUM (PORCINE) 5000 UNIT/ML IJ SOLN: 5000.0000 [IU] | Freq: Three times a day (TID) | INTRAMUSCULAR | Status: DC

## 2016-06-24 MED ORDER — CYCLOBENZAPRINE HCL 10 MG PO TABS
10.0000 mg | ORAL_TABLET | Freq: Every day | ORAL | Status: DC
  Administered 2016-06-24: 10 mg via ORAL
  Filled 2016-06-24: qty 1

## 2016-06-24 MED ORDER — ZOLPIDEM TARTRATE 5 MG PO TABS
10.0000 mg | ORAL_TABLET | Freq: Every day | ORAL | Status: DC
  Administered 2016-06-24: 10 mg via ORAL
  Filled 2016-06-24: qty 2

## 2016-06-24 MED ORDER — ASPIRIN EC 81 MG PO TBEC
81.0000 mg | DELAYED_RELEASE_TABLET | Freq: Every day | ORAL | Status: DC
  Administered 2016-06-25: 81 mg via ORAL
  Filled 2016-06-24: qty 1

## 2016-06-24 MED ORDER — ACETAMINOPHEN 500 MG PO TABS
1000.0000 mg | ORAL_TABLET | Freq: Four times a day (QID) | ORAL | Status: DC | PRN
  Administered 2016-06-25: 1000 mg via ORAL
  Filled 2016-06-24: qty 2

## 2016-06-24 MED ORDER — PANTOPRAZOLE SODIUM 40 MG PO TBEC
40.0000 mg | DELAYED_RELEASE_TABLET | Freq: Every day | ORAL | Status: DC
  Administered 2016-06-25: 40 mg via ORAL
  Filled 2016-06-24: qty 1

## 2016-06-24 MED ORDER — HYDROCHLOROTHIAZIDE 25 MG PO TABS: 25.0000 mg | ORAL_TABLET | Freq: Every day | ORAL | Status: DC

## 2016-06-24 MED ORDER — ASPIRIN 81 MG PO CHEW
324.0000 mg | CHEWABLE_TABLET | Freq: Once | ORAL | Status: AC
  Administered 2016-06-24: 324 mg via ORAL
  Filled 2016-06-24: qty 4

## 2016-06-24 MED ORDER — DIPHENHYDRAMINE HCL 25 MG PO CAPS: 25.0000 mg | ORAL_CAPSULE | Freq: Four times a day (QID) | ORAL | Status: DC | PRN

## 2016-06-24 MED ORDER — ENOXAPARIN SODIUM 40 MG/0.4ML ~~LOC~~ SOLN
40.0000 mg | SUBCUTANEOUS | Status: DC
  Administered 2016-06-24: 40 mg via SUBCUTANEOUS
  Filled 2016-06-24: qty 0.4

## 2016-06-24 MED ORDER — PANTOPRAZOLE SODIUM 40 MG PO TBEC: 80.0000 mg | DELAYED_RELEASE_TABLET | Freq: Every day | ORAL | Status: DC

## 2016-06-24 MED ORDER — ALUM & MAG HYDROXIDE-SIMETH 200-200-20 MG/5ML PO SUSP
30.0000 mL | ORAL | Status: DC | PRN
  Administered 2016-06-24: 30 mL via ORAL
  Filled 2016-06-24: qty 30

## 2016-06-24 MED ORDER — ALPRAZOLAM 0.25 MG PO TABS
0.2500 mg | ORAL_TABLET | ORAL | Status: DC | PRN
  Administered 2016-06-24: 0.25 mg via ORAL
  Filled 2016-06-24: qty 1

## 2016-06-24 MED ORDER — HYDROCHLOROTHIAZIDE 25 MG PO TABS
25.0000 mg | ORAL_TABLET | Freq: Every day | ORAL | Status: DC
  Administered 2016-06-24 – 2016-06-25 (×2): 25 mg via ORAL
  Filled 2016-06-24 (×2): qty 1

## 2016-06-24 MED ORDER — KETOROLAC TROMETHAMINE 15 MG/ML IJ SOLN
15.0000 mg | Freq: Once | INTRAMUSCULAR | Status: AC
  Administered 2016-06-24: 15 mg via INTRAVENOUS
  Filled 2016-06-24: qty 1

## 2016-06-24 MED ORDER — POTASSIUM CHLORIDE CRYS ER 20 MEQ PO TBCR
40.0000 meq | EXTENDED_RELEASE_TABLET | Freq: Once | ORAL | Status: AC
  Administered 2016-06-24: 40 meq via ORAL
  Filled 2016-06-24: qty 2

## 2016-06-24 MED ORDER — ACETAMINOPHEN 500 MG PO TABS
1000.0000 mg | ORAL_TABLET | Freq: Once | ORAL | Status: AC
  Administered 2016-06-24: 1000 mg via ORAL
  Filled 2016-06-24: qty 2

## 2016-06-24 MED ORDER — DICLOFENAC SODIUM 50 MG PO TBEC
50.0000 mg | DELAYED_RELEASE_TABLET | Freq: Two times a day (BID) | ORAL | Status: DC
  Filled 2016-06-24 (×2): qty 1

## 2016-06-24 NOTE — Progress Notes (Addendum)
Patient arrived to 2W room 11.  Telemetry monitor applied and CCMD notified.  Patient oriented to unit and room to include call light and phone.  Will continue to monitor. 

## 2016-06-24 NOTE — ED Triage Notes (Signed)
Pt reports high blood pressure last night. Hx of same. She reports BP at home 184/110, she took labetelol at home. Pt also reports she had nose bleed yesterday.

## 2016-06-24 NOTE — Progress Notes (Deleted)
Patient arrived to 2W room 11.  Telemetry monitor applied and CCMD notified.  Patient oriented to unit and room to include call light and phone.  Will continue to monitor. 

## 2016-06-24 NOTE — ED Notes (Signed)
Pt reports that "the paste is helping the dull aching feeling"

## 2016-06-24 NOTE — H&P (Signed)
Date: 06/24/2016               Patient Name:  Sharon Owen MRN: [id number]  DOB: [birth year] Age / Sex: 51 y.o., female   PCP: Alba Cory, MD         Medical Service: Internal Medicine Teaching Service         Attending Physician: Dr. Tyson Alias, MD    First Contact: Dr. Peggyann Juba Pager: [phone]  Second Contact: Dr. Loney Loh Pager: [phone]       After Hours (After 5p/  First Contact Pager: [phone]  weekends / holidays): Second Contact Pager: [phone]   Chief Complaint: "My blood pressure's been up since last night and my chest is hurting."  History of Present Illness: Sharon Owen is a 51 y.o. female with history of HTN, obesity, and anxiety who presents with 1 day of chest pressure, HA, and elevated BPs.  Last night she had a self limited nose bleed, which prompted her to take her blood pressure with her home wrist cuff.  Her BP read 184/110, so she took some HCTZ.  She went to bed and woke up at ~0300 with a dull, throbbing headache, moderate intensity, bifrontal, left sided, and radiating down her neck.  She went back to bed, and when she woke up this morning she had dull, left sided chest pressure radiating to her shoulder, 10/10 in intensity.  She took her husband's 25mg  labetalol this morning.  It was worse with walking and associated with shortness of breath.   She has a history of GERD with burning substernal chest pain, and has never had chest pain like this before. She came to the ED because she was concerned about a heart attack with her hypertension.  She has a history of HTN and was on HCTZ a year ago, but it was discontinued because her BPs normalized with weight loss.  Her wrist cuff normal gives readsings 120/80s.  She has not done much exercise in the last few months, but denies exertional chest pain and has mild dyspnea when going up stairs.  In the ED, she received nitroglycerin with improvement in her symptoms.   Meds:  Current Meds    Medication Sig  . ALPRAZolam (XANAX) 0.5 MG tablet TAKE HALF TO ONE TABLET BY MOUTH EVERY 4 HOURS AS NEEDED FOR ANXIETY  . aspirin EC 81 MG tablet Take 1 tablet (81 mg total) by mouth daily.  . Black Cohosh 200 MG CAPS Take 1 capsule (200 mg total) by mouth 2 (two) times daily.  . cyclobenzaprine (FLEXERIL) 10 MG tablet Take 1 tablet (10 mg total) by mouth at bedtime.  . diclofenac (VOLTAREN) 50 MG EC tablet Take 50 mg by mouth 2 (two) times daily.  . diphenhydrAMINE (BENADRYL) 25 MG tablet Take 25 mg by mouth every 6 (six) hours as needed. Reported on 05/28/2015  . esomeprazole (NEXIUM) 40 MG capsule Take 1 capsule (40 mg total) by mouth daily at 12 noon. Reported on 05/28/2015  . hydrochlorothiazide (HYDRODIURIL) 25 MG tablet Take 25 mg by mouth daily.  . Insulin Pen Needle 32G X 6 MM MISC 1 each by Does not apply route daily.  [name] VICTOZA 18 MG/3ML SOPN INJECT 0.3 MLS (1.8 MG TOTAL) INTO THE SKIN DAILY.  [name] zolpidem (AMBIEN) 10 MG tablet Take 1 tablet (10 mg total) by mouth at bedtime.     Allergies: Allergies as of 06/24/2016 - Review Complete 06/24/2016  Allergen Reaction Noted  .  Adhesive [tape] Itching 12/17/2011  . Latex Itching 12/08/2011  . Meloxicam  07/31/2015  . Gabapentin Rash 05/23/2014  . Morphine and related Rash 05/23/2014   Past Medical History:  Diagnosis Date  . Ankle fracture, right   . Anxiety   . Chronic osteoarthritis   . Elevated blood pressure   . Episcleritis of left eye   . GERD (gastroesophageal reflux disease)   . Hypertension   . Insomnia   . Metabolic syndrome   . Morbid obesity (HCC)     Family History:  Father with HTN, DM, stroke Mother with HTN Sister w/ DM  Social History: Works as Engineer, civil (consulting) and Sports coach at [employer] with husband, 3 children Occasional alcohol, 2-3 drinks/week Never smoker No drugs  Review of Systems: A complete ROS was negative except as per HPI.  Physical Exam: Blood pressure 118/68, pulse 71,  temperature 98.4 F (36.9 C), temperature source Oral, resp. rate 17, height 5\' 6"  (1.676 m), weight 240 lb (108.9 kg), SpO2 100 %.  Physical Exam  Constitutional: She is oriented to person, place, and time.  Obese, walking in hallway, pleasant, calm, in no distress  HENT:  Head: Normocephalic and atraumatic.  Eyes: Conjunctivae are normal. No scleral icterus.  Neck: Normal range of motion. Neck supple.  Cardiovascular: Normal rate, regular rhythm and normal heart sounds.   Pulmonary/Chest: Effort normal and breath sounds normal.  Abdominal: Soft. She exhibits no distension. There is no tenderness.  Musculoskeletal: She exhibits no tenderness.  Trace bilateral LE edema  Neurological: She is alert and oriented to person, place, and time.  Skin: Skin is warm and dry.  Psychiatric: She has a normal mood and affect. Her behavior is normal.    CBC Latest Ref Rng & Units 06/24/2016 07/08/2015 05/28/2015  WBC 4.0 - 10.5 K/uL 10.0 8.1 7.3  Hemoglobin 12.0 - 15.0 g/dL 16.1 09.6 -  Hematocrit 36.0 - 46.0 % 40.9 38.2 40.0  Platelets 150 - 400 K/uL 299 303 316   CMP Latest Ref Rng & Units 06/24/2016 07/08/2015 05/28/2015  Glucose 65 - 99 mg/dL 99 95 97  BUN 6 - 20 mg/dL 13 8 14   Creatinine 0.44 - 1.00 mg/dL 0.45 4.09 8.11  Sodium 135 - 145 mmol/L 139 143 143  Potassium 3.5 - 5.1 mmol/L 3.4(L) 3.9 4.3  Chloride 101 - 111 mmol/L 100(L) 107 101  CO2 22 - 32 mmol/L 29 23 22   Calcium 8.9 - 10.3 mg/dL 9.5 8.9 9.2  Total Protein 6.5 - 8.1 g/dL 7.5 - 7.5  Total Bilirubin 0.3 - 1.2 mg/dL 0.8 - 0.5  Alkaline Phos 38 - 126 U/L 94 - 123(H)  AST 15 - 41 U/L 20 - 15  ALT 14 - 54 U/L 14 - 10   Troponin (Point of Care Test)  Recent Labs  06/24/16 1049  TROPIPOC 0.00   CXR PA and Lat 06/25/2016 Normal.  No consolidation, edema, PTX, effusion.  EKG 06/24/2016 NSR, inferolateral T-wave changes (TWI III and biphasic/flat TWI in aVF, V3-V6), no ST elev/depr.  Unchanged from prior 07/2015, not present on  06/2015.  Echocardiogram 07/2015 Normal EF, normal diastolic function, normal wall motion.  Assessment & Plan by Problem: Active Problems:   Hypertensive urgency   Chest pain  51 y.o. female with history of obesity and HTN who presents with chest pain, headache, and elevated BPs and a stable, abnormal EKG.  She has a history of HTN, but HCTZ was discontinued last year after BPs improved with weight  loss.  Cardiac risk factors of obesity, HTN, and hyperlipidemia and chest pain history with mixed typical and atypical features.  She is hemodynamically stable and not having a STEMI, will continue cardiac workup and consider alternative etiologies.  #Chest Pain HEART score 5 (moderate risk), ASCVD 8.7%.  Outpatient stress ordered last year, never done. -Repeat EKG tomorrow -Troponin -Cardiac monitoring  #Palpitations Reports previously wearing Holter with no events detected, saw cardiologist Dr Alvino ChapelIngal in 2017. -Check TSH -Cardiac monitoring  #HTN -HCTZ 25 mg daily  #Hypokalemia -40 PO KCl -Repeat BMP tomorrow  Fluids: none Diet: heart DVT Prophylaxis: lovenox Code Status: full  Dispo: Admit patient to Observation with expected length of stay less than 2 midnights.  Signed: Alm BustardMatthew O'Sullivan, MD 06/24/2016, 1:36 PM  Pager: [phone]

## 2016-06-24 NOTE — ED Provider Notes (Signed)
[facility]-EMERGENCY DEPT Provider Note   CSN: [id number] Arrival date & time: 06/24/16  0906     History   Chief Complaint Chief Complaint  Patient presents with  . Hypertension    HPI Sharon Owen is a 51 y.o. female.  H/o HTN but was taken off of her HZTC last year due to improved BP.   The history is provided by the patient.  Hypertension  This is a recurrent (took BP at home 184/110 last night. was normal prior to that; checks it regularly) problem. Episode onset: noted last night after episode of epistaxis. The problem occurs constantly. Progression since onset: after taking 25mg  Labetalol this am (her husnbad's) Associated symptoms include chest pain, headaches (mild, dull headache. similar to prior) and shortness of breath. Nothing aggravates the symptoms.  Chest Pain   This is a new problem. The current episode started 6 to 12 hours ago. The problem occurs constantly. The problem has not changed since onset.The pain is present in the substernal region. The pain is mild. The quality of the pain is described as dull. The pain radiates to the left shoulder. Associated symptoms include headaches (mild, dull headache. similar to prior), nausea and shortness of breath. Pertinent negatives include no cough and no lower extremity edema.  Her past medical history is significant for hypertension.  Pertinent negatives for past medical history include no CAD, no CHF, no diabetes, no DVT, no hyperlipidemia, no MI, no PE, no strokes and no TIA.      Past Medical History:  Diagnosis Date  . Ankle fracture, right   . Anxiety   . Chronic osteoarthritis   . Elevated blood pressure   . Episcleritis of left eye   . GERD (gastroesophageal reflux disease)   . Hypertension   . Insomnia   . Metabolic syndrome   . Morbid obesity [facility](HCC)     Patient Active Problem List   Diagnosis Date Noted  . Hypertensive urgency 06/24/2016  . Metabolic syndrome 11/19/2015  . Lupus anticoagulant  positive 11/19/2015  . Elevated C-reactive protein 07/08/2015  . Perimenopause 05/28/2015  . Morbid obesity (HCC) 03/11/2015  . Gastro-esophageal reflux disease without esophagitis 03/11/2015  . Anxiety 03/11/2015  . Insomnia, persistent 03/11/2015  . Osteoarthritis of both knees 03/11/2015  . Episcleritis 03/11/2015  . History of fracture of tibia 05/23/2014  . History of sprain of ankle 05/23/2014  . S/P right knee arthroscopy 12/28/2011  . Medial meniscus, posterior horn derangement 12/01/2011  . Mononeuritis leg 07/17/2011  . Night muscle spasms 07/17/2011    Past Surgical History:  Procedure Laterality Date  . CESAREAN SECTION    . CHONDROPLASTY  12/24/2011   Procedure: CHONDROPLASTY;  Surgeon: Vickki HearingStanley E Harrison, MD;  Location: [facility];  Service: Orthopedics;  Laterality: Right;  . LEG SURGERY    . TIBIA IM NAIL INSERTION Right 05/23/2014   Procedure: INTRAMEDULLARY (IM) NAIL RIGHT TIBIA;  Surgeon: Harvie JuniorJohn L Graves, MD;  Location: [facility] OR;  Service: Orthopedics;  Laterality: Right;    OB History    No data available       Home Medications    Prior to Admission medications   Medication Sig Start Date End Date Taking? Authorizing Provider  ALPRAZolam Prudy Feeler(XANAX) 0.5 MG tablet TAKE HALF TO ONE TABLET BY MOUTH EVERY 4 HOURS AS NEEDED FOR ANXIETY 11/19/15  Yes Alba CoryKrichna Sowles, MD  aspirin EC 81 MG tablet Take 1 tablet (81 mg total) by mouth daily. 07/09/15  Yes Alba CoryKrichna Sowles, MD  [facility]  Cohosh 200 MG CAPS Take 1 capsule (200 mg total) by mouth 2 (two) times daily. 05/28/15  Yes Alba Cory, MD  cyclobenzaprine (FLEXERIL) 10 MG tablet Take 1 tablet (10 mg total) by mouth at bedtime. 10/06/15  Yes Alba Cory, MD  diclofenac (VOLTAREN) 50 MG EC tablet Take 50 mg by mouth 2 (two) times daily.   Yes Historical Provider, MD  diphenhydrAMINE (BENADRYL) 25 MG tablet Take 25 mg by mouth every 6 (six) hours as needed. Reported on 05/28/2015   Yes Historical Provider, MD  esomeprazole (NEXIUM) 40  MG capsule Take 1 capsule (40 mg total) by mouth daily at 12 noon. Reported on 05/28/2015 05/28/15  Yes Alba Cory, MD  hydrochlorothiazide (HYDRODIURIL) 25 MG tablet Take 25 mg by mouth daily.   Yes Historical Provider, MD  Insulin Pen Needle 32G X 6 MM MISC 1 each by Does not apply route daily. 10/13/15  Yes Alba Cory, MD  VICTOZA 18 MG/3ML SOPN INJECT 0.3 MLS (1.8 MG TOTAL) INTO THE SKIN DAILY. 01/22/16  Yes Alba Cory, MD  zolpidem (AMBIEN) 10 MG tablet Take 1 tablet (10 mg total) by mouth at bedtime. 06/21/16  Yes Alba Cory, MD    Family History Family History  Problem Relation Age of Onset  . Hypertension Mother   . Hypertension Father   . Diabetes Father   . Stroke Father   . Insomnia Sister   . Hypertension Sister   . Diabetes Sister     Social History Social History  Substance Use Topics  . Smoking status: Never Smoker  . Smokeless tobacco: Never Used  . Alcohol use 0.0 oz/week     Comment: occ     Allergies   Adhesive [tape]; Latex; Meloxicam; Gabapentin; and Morphine and related   Review of Systems Review of Systems  Respiratory: Positive for shortness of breath. Negative for cough.   Cardiovascular: Positive for chest pain.  Gastrointestinal: Positive for nausea.  Neurological: Positive for headaches (mild, dull headache. similar to prior).  Ten systems are reviewed and are negative for acute change except as noted in the HPI    Physical Exam Updated Vital Signs BP 170/85 (BP Location: Left Arm)   Pulse 83   Temp 98.4 F (36.9 C) (Oral)   Resp 24   Ht 5\' 6"  (1.676 m)   Wt 240 lb (108.9 kg)   SpO2 100%   BMI 38.74 kg/m   Physical Exam  Constitutional: She is oriented to person, place, and time. She appears well-developed and well-nourished. No distress.  obese  HENT:  Head: Normocephalic and atraumatic.  Nose: Nose normal.  Eyes: Conjunctivae and EOM are normal. Pupils are equal, round, and reactive to light. Right eye exhibits no  discharge. Left eye exhibits no discharge. No scleral icterus.  Neck: Normal range of motion. Neck supple.  Cardiovascular: Normal rate and regular rhythm.  Exam reveals no gallop and no friction rub.   No murmur heard. Pulmonary/Chest: Effort normal and breath sounds normal. No stridor. No respiratory distress. She has no rales.  Abdominal: Soft. She exhibits no distension. There is no tenderness.  Musculoskeletal: She exhibits no edema or tenderness.  Neurological: She is alert and oriented to person, place, and time.  Mental Status: Alert and oriented to person, place, and time. Attention and concentration normal. Speech clear. Recent memory is intac  Cranial Nerves  II Visual Fields: Intact to confrontation. Visual fields intact. III, IV, VI: Pupils equal and reactive to light and near. Full eye  movement without nystagmus  V Facial Sensation: Normal. No weakness of masticatory muscles  VII: No facial weakness or asymmetry  VIII Auditory Acuity: Grossly normal  IX/X: The uvula is midline; the palate elevates symmetrically  XI: Normal sternocleidomastoid and trapezius strength  XII: The tongue is midline. No atrophy or fasciculations.   Motor System: Muscle Strength: 5/5 and symmetric in the upper and lower extremities. No pronation or drift.  Muscle Tone: Tone and muscle bulk are normal in the upper and lower extremities.    Coordination: Intact finger-to-nose, heel-to-shin, and rapid alternating movements. No tremor.  Sensation: Intact to light touch, and pinprick. Negative Romberg test.  Gait: Routine gait normal    Skin: Skin is warm and dry. No rash noted. She is not diaphoretic. No erythema.  Psychiatric: She has a normal mood and affect.  Vitals reviewed.    ED Treatments / Results  Labs (all labs ordered are listed, but only abnormal results are displayed) Labs Reviewed  COMPREHENSIVE METABOLIC PANEL - Abnormal; Notable for the following:       Result Value   Potassium  3.4 (*)    Chloride 100 (*)    All other components within normal limits  CBC  D-DIMER, QUANTITATIVE (NOT AT [facility])  Rosezena Sensor, ED    EKG  EKG Interpretation  Date/Time:  Thursday June 24 2016 09:26:26 EST Ventricular Rate:  76 PR Interval:  130 QRS Duration: 70 QT Interval:  370 QTC Calculation: 416 R Axis:   77 Text Interpretation:  Normal sinus rhythm TW in inferolateral leads, new when compared to 06/2015 Reconfirmed by [facility] MD, PEDRO ([id number]) on 06/24/2016 11:37:42 AM       Radiology Dg Chest 2 View  Result Date: 06/24/2016 CLINICAL DATA:  Chest pain beginning this morning. EXAM: CHEST  2 VIEW COMPARISON:  PA and lateral chest 07/08/2015. FINDINGS: The lungs are clear. Heart size is normal. No pneumothorax or pleural fluid. No bony abnormality. IMPRESSION: Normal chest. Electronically Signed   By: Drusilla Kanner M.D.   On: 06/24/2016 11:18    Procedures Procedures (including critical care time)  Medications Ordered in ED Medications  nitroGLYCERIN (NITROGLYN) 2 % ointment 1 inch (1 inch Topical Given 06/24/16 1052)  aspirin chewable tablet 324 mg (324 mg Oral Given 06/24/16 1047)  acetaminophen (TYLENOL) tablet 1,000 mg (1,000 mg Oral Given 06/24/16 1202)     Initial Impression / Assessment and Plan / ED Course  I have reviewed the triage vital signs and the nursing notes.  Pertinent labs & imaging results that were available during my care of the patient were reviewed by me and considered in my medical decision making (see chart for details).     Typical chest pain that improve with nitroglycerin. EKG with new T-wave changes in the inferolateral leads. Given the changes patient does not qualify for heart score. Troponin negative. Will need admission for ACS rule out.  Chest x-ray without evidence suggestive of pneumonia, pneumothorax, pneumomediastinum.  No abnormal contour of the mediastinum to suggest dissection. No evidence of acute injuries.  Unable to  [facility], dimer negative. Doubt PE.  Patient dictation not classic for aortic dissection or esophageal perforation.  Typical headache for the pt. Non focal neuro exam. No recent head trauma. No fever. Doubt meningitis. Doubt intracranial bleed. Doubt IIH. No indication for imaging. Worsened due to NTG for chest pain. Given tylenol.  BP improved.  Admitted to IM for ACS rule out.  Final Clinical Impressions(s) / ED Diagnoses  Final diagnoses:  Chest pain  Hypertension, unspecified type  Nonintractable episodic headache, unspecified headache type     Nira Conn, MD 06/24/16 1248

## 2016-06-24 NOTE — ED Notes (Signed)
Pt ambulated to restroom located beside room, tolerated well. 

## 2016-06-25 DIAGNOSIS — R002 Palpitations: Secondary | ICD-10-CM | POA: Diagnosis not present

## 2016-06-25 DIAGNOSIS — Z885 Allergy status to narcotic agent status: Secondary | ICD-10-CM

## 2016-06-25 DIAGNOSIS — I160 Hypertensive urgency: Secondary | ICD-10-CM | POA: Diagnosis not present

## 2016-06-25 DIAGNOSIS — E876 Hypokalemia: Secondary | ICD-10-CM

## 2016-06-25 DIAGNOSIS — E785 Hyperlipidemia, unspecified: Secondary | ICD-10-CM

## 2016-06-25 DIAGNOSIS — R51 Headache: Secondary | ICD-10-CM | POA: Diagnosis not present

## 2016-06-25 DIAGNOSIS — Z91040 Latex allergy status: Secondary | ICD-10-CM

## 2016-06-25 DIAGNOSIS — I10 Essential (primary) hypertension: Secondary | ICD-10-CM | POA: Diagnosis not present

## 2016-06-25 DIAGNOSIS — Z91048 Other nonmedicinal substance allergy status: Secondary | ICD-10-CM

## 2016-06-25 DIAGNOSIS — Z888 Allergy status to other drugs, medicaments and biological substances status: Secondary | ICD-10-CM | POA: Diagnosis not present

## 2016-06-25 DIAGNOSIS — R079 Chest pain, unspecified: Secondary | ICD-10-CM | POA: Diagnosis not present

## 2016-06-25 LAB — LIPID PANEL
Cholesterol: 202 mg/dL — ABNORMAL HIGH (ref 0–200)
HDL: 46 mg/dL (ref >40)
LDL Cholesterol: 132 mg/dL — ABNORMAL HIGH (ref 0–99)
Total CHOL/HDL Ratio: 4.4 RATIO
Triglycerides: 121 mg/dL (ref <150)
VLDL: 24 mg/dL (ref 0–40)

## 2016-06-25 LAB — TSH: TSH: 2.667 u[IU]/mL (ref 0.350–4.500)

## 2016-06-25 LAB — HIV ANTIBODY (ROUTINE TESTING W REFLEX): HIV Screen 4th Generation wRfx: NONREACTIVE

## 2016-06-25 LAB — TROPONIN I: Troponin I: <0.03 ng/mL (ref <0.03)

## 2016-06-25 MED ORDER — HYDROCHLOROTHIAZIDE 25 MG PO TABS: 25.0000 mg | ORAL_TABLET | Freq: Every day | ORAL | 0 refills | Status: DC

## 2016-06-25 NOTE — Discharge Summary (Signed)
Name: Sharon Owen MRN: [id number] DOB: 09/19/1965 51 y.o. PCP: Alba CoryKrichna Sowles, MD  Date of Admission: 06/24/2016  9:23 AM Date of Discharge: 06/25/2016 Attending Physician: Tyson Aliasuncan Thomas Vincent, MD  Discharge Diagnosis:  Active Problems:   Hypertensive emergency   Chest pain   Hyperlipidemia   Discharge Medications: Allergies as of 06/25/2016      Reactions   Adhesive [tape] Itching   Latex Itching   Meloxicam    Elevated BP   Gabapentin Rash   Morphine And Related Rash      Medication List    TAKE these medications   ALPRAZolam 0.5 MG tablet Commonly known as:  XANAX TAKE HALF TO ONE TABLET BY MOUTH EVERY 4 HOURS AS NEEDED FOR ANXIETY   aspirin EC 81 MG tablet Take 1 tablet (81 mg total) by mouth daily.   Black Cohosh 200 MG Caps Take 1 capsule (200 mg total) by mouth 2 (two) times daily.   cyclobenzaprine 10 MG tablet Commonly known as:  FLEXERIL Take 1 tablet (10 mg total) by mouth at bedtime.   diclofenac 50 MG EC tablet Commonly known as:  VOLTAREN Take 50 mg by mouth 2 (two) times daily.   diphenhydrAMINE 25 MG tablet Commonly known as:  BENADRYL Take 25 mg by mouth every 6 (six) hours as needed. Reported on 05/28/2015   esomeprazole 40 MG capsule Commonly known as:  NEXIUM Take 1 capsule (40 mg total) by mouth daily at 12 noon. Reported on 05/28/2015   hydrochlorothiazide 25 MG tablet Commonly known as:  HYDRODIURIL Take 1 tablet (25 mg total) by mouth daily.   Insulin Pen Needle 32G X 6 MM Misc 1 each by Does not apply route daily.   VICTOZA 18 MG/3ML Sopn Generic drug:  liraglutide INJECT 0.3 MLS (1.8 MG TOTAL) INTO THE SKIN DAILY.   zolpidem 10 MG tablet Commonly known as:  AMBIEN Take 1 tablet (10 mg total) by mouth at bedtime.       Disposition and follow-up:   [facility]  At the hospital follow up visit please address:  1.  Hypertension.  Ask about BPs  at home, chest pain, headaches.  Re-started on HCTZ 25 mg daily during admission, please continue managing antihypertensives as appropriate.  2.  Labs / imaging needed at time of follow-up: BMP  3.  Pending labs/ test needing follow-up: none  Follow-up Appointments: Follow-up Information    Ruel FavorsKrichna F Sowles, MD. Schedule an appointment as soon as possible for a visit in 2 week(s).   Specialty:  Family Medicine Contact information: [address] [place] [address] [phone]           Hospital Course by problem list: Active Problems:   Hypertensive emergency   Chest pain   Hyperlipidemia   1. Hypertensive Emergency Sharon Manson PasseyBrown was admitted with chest pain, headache, and high blood pressures at home for 1 day.  Her home wrist cuff usually gives her normotensive readings, and the evening before admission her BP was 184/110.  She then developed a dull headache and dull, left sided chest pressure the following morning.  Upon presentation to the ED, her BP was elevated to 170/85 and she was given nitroglycerin paste which reduced her BP and improved her symptoms.  She has an abnormal EKG with inferolateral T-wave changes stable from last year, but no new ischemic changes and ruled out ACS with serial negative troponins.  For blood pressure, she was restarted on her prior HCTZ 25 mg daily and was normotensive prior to discharge.  Risk factors for CAD were investigated, and she was found to be low risk (ASCVD 4.7%).  She reports increased stress, less sleep, and long work hours recently, which may have precipitated her hypertensive episode.   2. Palpitations Reports history of palpitations.  Monitored on telemetry overnight with only rare PVCs.  TSH normal.  3. Hypokalemia K of 3.4 normalized with oral supplementation.  Will need repeat BMP as outpatient after starting HCTZ.  Discharge Vitals:   BP 137/61 (BP Location: Right Arm)   Pulse 72   Temp 98.8 F (37.1 C)  (Oral)   Resp 19   Ht 5\' 6"  (1.676 m)   Wt 240 lb (108.9 kg)   SpO2 98%   BMI 38.74 kg/m   Pertinent Labs, Studies, and Procedures:   Lipid Panel     Component Value Date/Time   CHOL 202 (H) 06/25/2016 0826   CHOL 192 05/28/2015 0944   TRIG 121 06/25/2016 0826   HDL 46 06/25/2016 0826   HDL 49 05/28/2015 0944   CHOLHDL 4.4 06/25/2016 0826   VLDL 24 06/25/2016 0826   LDLCALC 132 (H) 06/25/2016 0826   LDLCALC 127 (H) 05/28/2015 0944   TSH 2.67  Discharge Instructions: Discharge Instructions    Diet - low sodium heart healthy    Complete by:  As directed    Increase activity slowly    Complete by:  As directed     You were admitted to the hospital with chest pain and headache that I think were caused by your high blood pressure.  We controlled your blood pressure and your symptoms went away.  I have prescribed you HCTZ to start taking again to keep your blood pressure controlled.  Keep checking your blood pressure at home, and bring your records to your PCP.  Please call your PCP ASAP to schedule a hospital follow-up appointment in 1-2 weeks.  If you have chest pain or shortness of breath, please come back to the ED as these could still be signs of an emergency.  Signed: Alm Bustard, MD 06/25/2016, 12:50 PM   Pager: [phone]

## 2016-06-25 NOTE — Progress Notes (Signed)
   Subjective: Slept well overnight without chest pain or dyspnea.  Endorses episode of palpitations around 10 pm yesterday evening.  More work stress with longer hours and less sleep in recent weeks.  Removed nitro paste this morning.  Objective:  Vital signs in last 24 hours: Vitals:   06/24/16 1500 06/24/16 1739 06/24/16 1956 06/25/16 0547  BP: (!) 151/79 140/71 (!) 126/58 (!) 108/54  Pulse: 74 73 75 70  Resp: 16  18 18   Temp: 98.3 F (36.8 C)  98.4 F (36.9 C) 97.7 F (36.5 C)  TempSrc: Oral  Oral Oral  SpO2: 97%  98% 98%  Weight:      Height:       Telemetry: NSR, rare PVCs  Physical Exam  Constitutional: She is oriented to person, place, and time. She appears well-developed and well-nourished. No distress.  Cardiovascular: Normal rate and regular rhythm.   Pulmonary/Chest: Effort normal and breath sounds normal.  Neurological: She is alert and oriented to person, place, and time.  Psychiatric: She has a normal mood and affect. Her behavior is normal.   TSH 2.67  Cardiac Panel (last 3 results)  Recent Labs  06/24/16 1805 06/25/16 0018  TROPONINI <0.03 <0.03   Lipid Panel     Component Value Date/Time   CHOL 202 (H) 06/25/2016 0826   CHOL 192 05/28/2015 0944   TRIG 121 06/25/2016 0826   HDL 46 06/25/2016 0826   HDL 49 05/28/2015 0944   CHOLHDL 4.4 06/25/2016 0826   VLDL 24 06/25/2016 0826   LDLCALC 132 (H) 06/25/2016 0826   LDLCALC 127 (H) 05/28/2015 0944   EKG 06/25/2016 Unchanged from admission.  NSR, inferolateral T-wave changes (TWI III and biphasic/flat TWI in aVF, V3-V6), no ST elev/depr.  Unchanged from prior 07/2015.  Assessment/Plan:  Active Problems:   Hypertensive emergency   Chest pain   Hyperlipidemia   51 y.o. female with history of obesity, HTN, and HL and hypertensive emergency with symptoms of headache and chest pain.  She has a history of HTN, but HCTZ was discontinued last year after BPs improved with weight loss.  Will evaluate and  medically optimize cardiac risk factors.  #HTN Removed nitro paste this morning, HCTZ 25 mg started on day of admission. -HCTZ 25 mg daily -Re-evaluate BP in early afternoon after nitro removed  #Chest Pain No ACS with negative troponins and stable EKG.  ASCVD low risk 4.7%, statin not indicated.  #Palpitations Reports previously wearing Holter with no events detected, saw cardiologist Dr Alvino ChapelIngal in 2017.  TSH normal, only rare PVCs on telemetry overnight.  #Hypokalemia Normalized with 40 PO KCl.  Fluids: none Diet: heart DVT Prophylaxis: lovenox Code Status: full  Dispo: Anticipated discharge 0-1 days.  Alm BustardMatthew O'Sullivan, MD 06/25/2016, 12:06 PM Pager: [phone]

## 2016-06-25 NOTE — Discharge Instructions (Signed)
You were admitted to the hospital with chest pain and headache that I think were caused by your high blood pressure.  We controlled your blood pressure and your symptoms went away.  I have prescribed you HCTZ to start taking again to keep your blood pressure controlled.  Keep checking your blood pressure at home, and bring your records to your PCP.  Please call your PCP ASAP to schedule a hospital follow-up appointment in 1-2 weeks.  If you have chest pain or shortness of breath, please come back to the ED as these could still be signs of an emergency.

## 2016-06-25 NOTE — Progress Notes (Signed)
Patient was counseled on her discharge medication, hydrochlorothiazide. She was told to take this medication every morning and to continue to monitor her blood pressure daily.   Sharon BarriosSarah Matison Nuccio and Dalton Hudgins Marina GravelCamp[facility]PHS PharmD Candidate [phone]

## 2016-06-26 NOTE — Progress Notes (Signed)
Discharged to home with family office visits in place teaching done  

## 2016-07-05 ENCOUNTER — Ambulatory Visit: Payer: 59 | Admitting: Family Medicine

## 2016-07-07 ENCOUNTER — Ambulatory Visit (INDEPENDENT_AMBULATORY_CARE_PROVIDER_SITE_OTHER): Payer: 59 | Admitting: Family Medicine

## 2016-07-07 ENCOUNTER — Encounter: Payer: Self-pay | Admitting: Family Medicine

## 2016-07-07 VITALS — BP 126/78 | HR 110 | Temp 98.4°F | Resp 18 | Ht 66.0 in | Wt 255.4 lb

## 2016-07-07 DIAGNOSIS — R079 Chest pain, unspecified: Secondary | ICD-10-CM

## 2016-07-07 DIAGNOSIS — I10 Essential (primary) hypertension: Secondary | ICD-10-CM | POA: Diagnosis not present

## 2016-07-07 DIAGNOSIS — Z09 Encounter for follow-up examination after completed treatment for conditions other than malignant neoplasm: Secondary | ICD-10-CM

## 2016-07-07 DIAGNOSIS — F411 Generalized anxiety disorder: Secondary | ICD-10-CM

## 2016-07-07 MED ORDER — ALPRAZOLAM 0.5 MG PO TABS: ORAL_TABLET | ORAL | 0 refills | Status: DC

## 2016-07-07 MED ORDER — METOPROLOL SUCCINATE ER 25 MG PO TB24: 25.0000 mg | ORAL_TABLET | Freq: Every day | ORAL | 0 refills | Status: DC

## 2016-07-07 NOTE — Progress Notes (Signed)
Name: Sharon Owen   MRN: [id number]    DOB: Jul 23, 1965   Date:07/07/2016       Progress Note  Subjective  Chief Complaint  Chief Complaint  Patient presents with  . Hospitalization Follow-up    patient already has an appt for a cardiac stress test.  . Hypertension    patient stated that she has been having some shooting headaches for about 2-3 weeks.  . Insomnia    patient stated even with medication she is having a very hard time sleeping    HPI  Hospital Discharge Follow up: she was doing well, losing weight bp had improved and stopped Chlorthalidone around Dec 2017. She states she has been working two jobs for a long time, but added a new shift ( extra 8 hours per week ) since Dec 2017 to help pay for her daughter's college. 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 She has been feeling stressed lately, with kids and tired. She developed chest pain and elevated bp on 06/24/2016, she had negative troponin and D-dimer, mild change on EKG. She is also feeling more anxious and states xanax helped with pain while at the hospital. Currently having headaches again, intermittent chest pain and palpitation. Woke up last night with symptoms. She has a stress test scheduled for 07/14/2016. She does not have time to sleep since she works third shifts and cannot take Ambien so she can get up in am's.    Patient Active Problem List   Diagnosis Date Noted  . Hyperlipidemia 06/25/2016  . Hypertensive emergency 06/24/2016  . Chest pain 06/24/2016  . Metabolic syndrome 79/05/4095  . Lupus anticoagulant positive 11/19/2015  . Elevated C-reactive protein 07/08/2015  . Perimenopause 05/28/2015  . Morbid obesity ([place]) 03/11/2015  . Gastro-esophageal reflux disease without esophagitis 03/11/2015  . Anxiety 03/11/2015  . Insomnia, persistent 03/11/2015  . Osteoarthritis of both knees 03/11/2015  .  Episcleritis 03/11/2015  . History of fracture of tibia 05/23/2014  . History of sprain of ankle 05/23/2014  . S/P right knee arthroscopy 12/28/2011  . Medial meniscus, posterior horn derangement 12/01/2011  . Mononeuritis leg 07/17/2011  . Night muscle spasms 07/17/2011    Past Surgical History:  Procedure Laterality Date  . CESAREAN SECTION  1988  . CHONDROPLASTY  12/24/2011   Procedure: CHONDROPLASTY;  Surgeon: Carole Civil, MD;  Location: [facility];  Service: Orthopedics;  Laterality: Right;  . DILATION AND CURETTAGE OF UTERUS     "more than 1"  . FRACTURE SURGERY    . INTRAUTERINE DEVICE INSERTION  ~ 2016  . TIBIA IM NAIL INSERTION Right 05/23/2014   Procedure: INTRAMEDULLARY (IM) NAIL RIGHT TIBIA;  Surgeon: Alta Corning, MD;  Location: [facility];  Service: Orthopedics;  Laterality: Right;    Family History  Problem Relation Age of Onset  . Hypertension Mother   . Hypertension Father   . Diabetes Father   . Stroke Father   . Insomnia Sister   . Hypertension Sister   . Diabetes Sister     Social History   Social History  . Marital status: Married    Spouse name: N/A  . Number of children: N/A  . Years of education: N/A   Occupational History  . Not on file.   Social History Main Topics  . Smoking status: Never Smoker  . Smokeless tobacco: Never Used  .  Alcohol use 2.4 oz/week    2 Cans of beer, 2 Shots of liquor per week  . Drug use: No  . Sexual activity: Yes   Other Topics Concern  . Not on file   Social History Narrative  . No narrative on file     Current Outpatient Prescriptions:  .  ALPRAZolam (XANAX) 0.5 MG tablet, TAKE HALF TO ONE TABLET BY MOUTH EVERY 4 HOURS AS NEEDED FOR ANXIETY, Disp: 30 tablet, Rfl: 0 .  aspirin EC 81 MG tablet, Take 1 tablet (81 mg total) by mouth daily., Disp: 30 tablet, Rfl: 0 .  Black Cohosh 200 MG CAPS, Take 1 capsule (200 mg total) by mouth 2 (two) times daily., Disp: 60 capsule, Rfl: 0 .  cyclobenzaprine (FLEXERIL)  10 MG tablet, Take 1 tablet (10 mg total) by mouth at bedtime., Disp: 90 tablet, Rfl: 0 .  diclofenac (VOLTAREN) 50 MG EC tablet, Take 50 mg by mouth 2 (two) times daily., Disp: , Rfl:  .  diphenhydrAMINE (BENADRYL) 25 MG tablet, Take 25 mg by mouth every 6 (six) hours as needed. Reported on 05/28/2015, Disp: , Rfl:  .  esomeprazole (NEXIUM) 40 MG capsule, Take 1 capsule (40 mg total) by mouth daily at 12 noon. Reported on 05/28/2015, Disp: 30 capsule, Rfl: 5 .  hydrochlorothiazide (HYDRODIURIL) 25 MG tablet, Take 1 tablet (25 mg total) by mouth daily., Disp: 30 tablet, Rfl: 0 .  Insulin Pen Needle 32G X 6 MM MISC, 1 each by Does not apply route daily., Disp: 100 each, Rfl: 0 .  metoprolol succinate (TOPROL-XL) 25 MG 24 hr tablet, Take 1 tablet (25 mg total) by mouth at bedtime., Disp: 30 tablet, Rfl: 0 .  VICTOZA 18 MG/3ML SOPN, INJECT 0.3 MLS (1.8 MG TOTAL) INTO THE SKIN DAILY., Disp: 9 mL, Rfl: 1 .  zolpidem (AMBIEN) 10 MG tablet, Take 1 tablet (10 mg total) by mouth at bedtime., Disp: 15 tablet, Rfl: 0  Allergies  Allergen Reactions  . Adhesive [Tape] Itching  . Latex Itching  . Meloxicam     Elevated BP  . Gabapentin Rash  . Morphine And Related Rash     ROS  Constitutional: Negative for fever, positive for  weight change.  Respiratory: Negative for cough and shortness of breath.   Cardiovascular: Positive  for chest pain and palpitations.  Gastrointestinal: Negative for abdominal pain, no bowel changes.  Musculoskeletal: Negative for gait problem or joint swelling.  Skin: Negative for rash.  Neurological: Negative for dizziness or headache.  No other specific complaints in a complete review of systems (except as listed in HPI above).  Objective  Vitals:   07/07/16 0801 07/07/16 0828  BP: 126/78 126/78  Pulse: (!) 110   Resp: 18   Temp: 98.4 F (36.9 C)   TempSrc: Oral   SpO2: 97%   Weight: 255 lb 6.4 oz (115.8 kg)   Height: 5' 6" (1.676 m)     Body mass index is 41.22  kg/m.  Physical Exam  Constitutional: Patient appears well-developed and well-nourished. Obese  No distress.  HEENT: head atraumatic, normocephalic, pupils equal and reactive to light,  neck supple, throat within normal limits Cardiovascular: Normal rate, regular rhythm and normal heart sounds.  No murmur heard. No BLE edema. Pulmonary/Chest: Effort normal and breath sounds normal. No respiratory distress. Abdominal: Soft.  There is no tenderness. Psychiatric: Patient has a normal mood and affect. behavior is normal. Judgment and thought content normal.   Recent Results (from the  past 2160 hour(s))  CBC     Status: None   Collection Time: 06/24/16 10:29 AM  Result Value Ref Range   WBC 10.0 4.0 - 10.5 K/uL   RBC 4.51 3.87 - 5.11 MIL/uL   Hemoglobin 13.3 12.0 - 15.0 g/dL   HCT 40.9 36.0 - 46.0 %   MCV 90.7 78.0 - 100.0 fL   MCH 29.5 26.0 - 34.0 pg   MCHC 32.5 30.0 - 36.0 g/dL   RDW 14.8 11.5 - 15.5 %   Platelets 299 150 - 400 K/uL  Comprehensive metabolic panel     Status: Abnormal   Collection Time: 06/24/16 10:29 AM  Result Value Ref Range   Sodium 139 135 - 145 mmol/L   Potassium 3.4 (L) 3.5 - 5.1 mmol/L   Chloride 100 (L) 101 - 111 mmol/L   CO2 29 22 - 32 mmol/L   Glucose, Bld 99 65 - 99 mg/dL   BUN 13 6 - 20 mg/dL   Creatinine, Ser 0.75 0.44 - 1.00 mg/dL   Calcium 9.5 8.9 - 10.3 mg/dL   Total Protein 7.5 6.5 - 8.1 g/dL   Albumin 3.9 3.5 - 5.0 g/dL   AST 20 15 - 41 U/L   ALT 14 14 - 54 U/L   Alkaline Phosphatase 94 38 - 126 U/L   Total Bilirubin 0.8 0.3 - 1.2 mg/dL   GFR calc non Af Amer >60 >60 mL/min   GFR calc Af Amer >60 >60 mL/min    Comment: (NOTE) The eGFR has been calculated using the CKD EPI equation. This calculation has not been validated in all clinical situations. eGFR's persistently <60 mL/min signify possible Chronic Kidney Disease.    Anion gap 10 5 - 15  D-dimer, quantitative (not at [facility])     Status: None   Collection Time: 06/24/16 10:29 AM   Result Value Ref Range   D-Dimer, Quant 0.34 0.00 - 0.50 ug/mL-FEU    Comment: (NOTE) At the manufacturer cut-off of 0.50 ug/mL FEU, this assay has been documented to exclude PE with a sensitivity and negative predictive value of 97 to 99%.  At this time, this assay has not been approved by the FDA to exclude DVT/VTE. Results should be correlated with clinical presentation.   I-stat troponin, ED     Status: None   Collection Time: 06/24/16 10:49 AM  Result Value Ref Range   Troponin i, poc 0.00 0.00 - 0.08 ng/mL   Comment 3            Comment: Due to the release kinetics of cTnI, a negative result within the first hours of the onset of symptoms does not rule out myocardial infarction with certainty. If myocardial infarction is still suspected, repeat the test at appropriate intervals.   HIV antibody (Routine Testing)     Status: None   Collection Time: 06/24/16  3:59 PM  Result Value Ref Range   HIV Screen 4th Generation wRfx Non Reactive Non Reactive    Comment: (NOTE) Performed At: [facility] [place], [place] [id number] Lindon Romp MD FK:[id number]   Troponin I     Status: None   Collection Time: 06/24/16  6:05 PM  Result Value Ref Range   Troponin I <0.03 <0.03 ng/mL  Basic metabolic panel     Status: Abnormal   Collection Time: 06/24/16  6:05 PM  Result Value Ref Range   Sodium 139 135 - 145 mmol/L   Potassium 3.7 3.5 - 5.1  mmol/L   Chloride 104 101 - 111 mmol/L   CO2 28 22 - 32 mmol/L   Glucose, Bld 102 (H) 65 - 99 mg/dL   BUN 16 6 - 20 mg/dL   Creatinine, Ser 0.76 0.44 - 1.00 mg/dL   Calcium 9.3 8.9 - 10.3 mg/dL   GFR calc non Af Amer >60 >60 mL/min   GFR calc Af Amer >60 >60 mL/min    Comment: (NOTE) The eGFR has been calculated using the CKD EPI equation. This calculation has not been validated in all clinical situations. eGFR's persistently <60 mL/min signify possible Chronic Kidney Disease.    Anion gap 7 5 - 15  Troponin  I     Status: None   Collection Time: 06/25/16 12:18 AM  Result Value Ref Range   Troponin I <0.03 <0.03 ng/mL  Lipid panel     Status: Abnormal   Collection Time: 06/25/16  8:26 AM  Result Value Ref Range   Cholesterol 202 (H) 0 - 200 mg/dL   Triglycerides 121 <150 mg/dL   HDL 46 >40 mg/dL   Total CHOL/HDL Ratio 4.4 RATIO   VLDL 24 0 - 40 mg/dL   LDL Cholesterol 132 (H) 0 - 99 mg/dL    Comment:        Total Cholesterol/HDL:CHD Risk Coronary Heart Disease Risk Table                     Men   Women  1/2 Average Risk   3.4   3.3  Average Risk       5.0   4.4  2 X Average Risk   9.6   7.1  3 X Average Risk  23.4   11.0        Use the calculated Patient Ratio above and the CHD Risk Table to determine the patient's CHD Risk.        ATP III CLASSIFICATION (LDL):  <100     mg/dL   Optimal  100-129  mg/dL   Near or Above                    Optimal  130-159  mg/dL   Borderline  160-189  mg/dL   High  >190     mg/dL   Very High   TSH     Status: None   Collection Time: 06/25/16  8:26 AM  Result Value Ref Range   TSH 2.667 0.350 - 4.500 uIU/mL    Comment: Performed by a 3rd Generation assay with a functional sensitivity of <=0.01 uIU/mL.     PHQ2/9: Depression screen [facility] 2/9 07/07/2016 10/06/2015 07/09/2015 05/28/2015 03/11/2015  Decreased Interest 0 0 0 0 0  Down, Depressed, Hopeless 0 0 0 0 0  PHQ - 2 Score 0 0 0 0 0    Fall Risk: Fall Risk  07/07/2016 10/06/2015 07/09/2015 05/28/2015 03/11/2015  Falls in the past year? No No No No Yes  Number falls in past yr: - - - - 1  Injury with Fall? - - - - Yes    Functional Status Survey: Is the patient deaf or have difficulty hearing?: No Does the patient have difficulty seeing, even when wearing glasses/contacts?: No Does the patient have difficulty concentrating, remembering, or making decisions?: No Does the patient have difficulty walking or climbing stairs?: No Does the patient have difficulty dressing or bathing?: No Does the  patient have difficulty doing errands alone such as visiting a doctor's office  or shopping?: No    Assessment & Plan  1. Chest pain, unspecified type  - metoprolol succinate (TOPROL-XL) 25 MG 24 hr tablet; Take 1 tablet (25 mg total) by mouth at bedtime.  Dispense: 30 tablet; Refill: 0  2. Hypertension, benign  - metoprolol succinate (TOPROL-XL) 25 MG 24 hr tablet; Take 1 tablet (25 mg total) by mouth at bedtime.  Dispense: 30 tablet; Refill: 0 BP still elevated at home and having palpitation, we will add beta-blocker but advised to stop 36 hours prior to her stress test on 07/14/2016  3. Hospital discharge follow-up  She was admitted for chest pain, negative troponin and D-dimer, mild change on EKG. She was out of work from 06/24/2016 returned to work 06/30/2016 and worked until 07/02/2016, but bp started to spiked again associated with  chest pain plus headache and has been out of work since.  4. GAD (generalized anxiety disorder)  - ALPRAZolam (XANAX) 0.5 MG tablet; TAKE HALF TO ONE TABLET BY MOUTH EVERY 4 HOURS AS NEEDED FOR ANXIETY  Dispense: 30 tablet; Refill: 0

## 2016-07-13 NOTE — Progress Notes (Signed)
Cardiology Office Note   Date:  07/14/2016   ID:  [name], DOB [birth year], MRN [id number]  PCP:  Ruel Favors, MD  Cardiologist:   Chilton Si, MD   Chief Complaint  Patient presents with  . New Patient (Initial Visit)     Pt staates no Sx      History of Present Illness: Sharon Owen is a 51 y.o. female with hypertension, morbid obesity and hyperlipidemia who presents for management of hypertension and chest pain.  Sharon Owen was seen in the ED 06/25/16 with chest pain.  She was off her antihypertension medications and under a lot of stress.  Cardiac enzymes were negative and her EKG was negative for ischemia.  Hydrochlorothiazide was restarted and she followed up with Dr. Alba Cory on 3/14.  Her blood pressure at home has been around 120-130/80. She was started on metoprolol and referred for stress testing.  Sharon Owen also reports palpitations that have been ongoing for 6 months. The last for several minutes at a time and seemed to be worse in the evening. Metoprolol has helped these palpitations. There is some associated lightheadedness and dizziness but no shortness of breath. It typically occurs when she is at work. 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  Her chest pain occurs in the left side of her chest and radiates to her left shoulder. These episodes last up to an hour to time and are unpredictable. Typically they also occur with exertion. Usually it is moderate in intensity but can be up to 10 out of 10 in severity at its worst.  Sharon Owen notes occasional lower extremity especially after long car rides. She denies orthopnea or PND. She was walking up to 4 miles per day when the weather was warmer. Lately she has not been exercising.  She was previously on antihypertensives but these were stopped when she lost a lot of weight. When she was seen in the emergency department in March she was unaware that her blood pressure was high  again. She avoids salt and has minimal alcohol intake.   Past Medical History:  Diagnosis Date  . Anxiety   . Chronic osteoarthritis    "knees" (06/24/2016)  . Complication of anesthesia    "I'm hard to wake up" (06/24/2016)  . Elevated blood pressure   . Episclerotitis, right    "of eye"  . GERD (gastroesophageal reflux disease)   . Headache    "monthly at least" (06/24/2016)  . Hypertension   . Insomnia   . Metabolic syndrome   . Morbid obesity (HCC)     Past Surgical History:  Procedure Laterality Date  . CESAREAN SECTION  1988  . CHONDROPLASTY  12/24/2011   Procedure: CHONDROPLASTY;  Surgeon: Vickki Hearing, MD;  Location: [facility];  Service: Orthopedics;  Laterality: Right;  . DILATION AND CURETTAGE OF UTERUS     "more than 1"  . FRACTURE SURGERY    . INTRAUTERINE DEVICE INSERTION  ~ 2016  . TIBIA IM NAIL INSERTION Right 05/23/2014   Procedure: INTRAMEDULLARY (IM) NAIL RIGHT TIBIA;  Surgeon: Harvie Junior, MD;  Location: [facility];  Service: Orthopedics;  Laterality: Right;     Current Outpatient Prescriptions  Medication Sig Dispense Refill  . ALPRAZolam (XANAX) 0.5 MG tablet TAKE HALF TO ONE TABLET BY MOUTH EVERY 4 HOURS AS NEEDED FOR ANXIETY 30 tablet 0  . aspirin EC 81 MG tablet Take 1 tablet (81  mg total) by mouth daily. 30 tablet 0  . Black Cohosh 200 MG CAPS Take 1 capsule (200 mg total) by mouth 2 (two) times daily. 60 capsule 0  . cyclobenzaprine (FLEXERIL) 10 MG tablet Take 1 tablet (10 mg total) by mouth at bedtime. 90 tablet 0  . diclofenac (VOLTAREN) 50 MG EC tablet Take 50 mg by mouth 2 (two) times daily.    . diphenhydrAMINE (BENADRYL) 25 MG tablet Take 25 mg by mouth every 6 (six) hours as needed. Reported on 05/28/2015    . esomeprazole (NEXIUM) 40 MG capsule Take 1 capsule (40 mg total) by mouth daily at 12 noon. Reported on 05/28/2015 30 capsule 5  . Insulin Pen Needle 32G X 6 MM MISC 1 each by Does not apply route daily. 100 each 0  . beclomethasone (QVAR) 40  MCG/ACT inhaler Inhale 2 puffs into the lungs 2 (two) times daily. 1 Inhaler 0  . hydrochlorothiazide (HYDRODIURIL) 25 MG tablet Take 1 tablet (25 mg total) by mouth daily. 90 tablet 1  . liraglutide (VICTOZA) 18 MG/3ML SOPN Inject 0.3 mLs (1.8 mg total) into the skin daily. 9 mL 2  . metoprolol succinate (TOPROL-XL) 25 MG 24 hr tablet Take 1 tablet (25 mg total) by mouth at bedtime. 90 tablet 0  . zolpidem (AMBIEN) 10 MG tablet Take 0.5-1 tablets (5-10 mg total) by mouth at bedtime. 90 tablet 0   No current facility-administered medications for this visit.     Allergies:   Adhesive [tape]; Latex; Meloxicam; Gabapentin; and Morphine and related    Social History:  The patient  reports that she has never smoked. She has never used smokeless tobacco. She reports that she drinks about 2.4 oz of alcohol per week . She reports that she does not use drugs.   Family History:  The patient's family history includes Diabetes in her father, paternal grandfather, and sister; Heart attack in her paternal grandfather; Heart failure in her maternal grandfather; Hypertension in her father, mother, and sister; Insomnia in her sister; Stroke in her father.    ROS:  Please see the history of present illness.   Otherwise, review of systems are positive for none.   All other systems are reviewed and negative.    PHYSICAL EXAM: VS:  BP 122/64 (BP Location: Left Arm, Patient Position: Sitting, Cuff Size: Large)   Pulse 97   Ht 5\' 6"  (1.676 m)   Wt 114.3 kg (252 lb)   BMI 40.67 kg/m  , BMI Body mass index is 40.67 kg/m. GENERAL:  Well appearing HEENT:  Pupils equal round and reactive, fundi not visualized, oral mucosa unremarkable NECK:  No jugular venous distention, waveform within normal limits, carotid upstroke brisk and symmetric, no bruits, no thyromegaly LYMPHATICS:  No cervical adenopathy LUNGS:  Clear to auscultation bilaterally HEART:  RRR.  PMI not displaced or sustained,S1 and S2 within normal  limits, no S3, no S4, no clicks, no rubs, no murmurs ABD:  Flat, positive bowel sounds normal in frequency in pitch, no bruits, no rebound, no guarding, no midline pulsatile mass, no hepatomegaly, no splenomegaly EXT:  2 plus pulses throughout, no edema, no cyanosis no clubbing SKIN:  No rashes no nodules NEURO:  Cranial nerves II through XII grossly intact, motor grossly intact throughout PSYCH:  Cognitively intact, oriented to person place and time   EKG:  EKG is not ordered today.   Recent Labs: 06/24/2016: ALT 14; BUN 16; Creatinine, Ser 0.76; Hemoglobin 13.3; Platelets 299; Potassium 3.7;  Sodium 139 06/25/2016: TSH 2.667    Lipid Panel    Component Value Date/Time   CHOL 202 (H) 06/25/2016 0826   CHOL 192 05/28/2015 0944   TRIG 121 06/25/2016 0826   HDL 46 06/25/2016 0826   HDL 49 05/28/2015 0944   CHOLHDL 4.4 06/25/2016 0826   VLDL 24 06/25/2016 0826   LDLCALC 132 (H) 06/25/2016 0826   LDLCALC 127 (H) 05/28/2015 0944      Wt Readings from Last 3 Encounters:  07/14/16 114.3 kg (252 lb)  07/14/16 114.3 kg (252 lb)  07/07/16 115.8 kg (255 lb 6.4 oz)      ASSESSMENT AND PLAN:  # Atypical chest pain:  We will obtain obtain an exercise Myoview to better evaluate. Continue aspirin.   # Hypertension: Blood pressure is better-controlled. On repeat it was 122/64, which is similar to what she has been sitting at home. Continue hydrochlorothiazide and metoprolol.  # Palpitations: Better-controlled on metoprolol. She wore an event monitor in the past and had a severe reaction to the adhesive. She is not interested in trying this again.  # Hyperlipidemia: ASCVD 10 year risk 9%.  Continue aspirin 81 mg daily.  Recommended diet and weight loss.  Repeat lipids in 3 months.   Current medicines are reviewed at length with the patient today.  The patient does not have concerns regarding medicines.  The following changes have been made:  no change  Labs/ tests ordered today include:    Orders Placed This Encounter  Procedures  . Myocardial Perfusion Imaging     Disposition:   FU with Kammi Hechler C. Duke Salvia, MD, [facility] in 1 year    This note was written with the assistance of speech recognition software.  Please excuse any transcriptional errors.  Signed, Mekia Dipinto C. Duke Salvia, MD, [facility]  07/14/2016 6:10 PM    [facility]

## 2016-07-14 ENCOUNTER — Encounter: Payer: Self-pay | Admitting: Family Medicine

## 2016-07-14 ENCOUNTER — Ambulatory Visit (INDEPENDENT_AMBULATORY_CARE_PROVIDER_SITE_OTHER): Payer: 59 | Admitting: Family Medicine

## 2016-07-14 ENCOUNTER — Encounter: Payer: Self-pay | Admitting: Cardiovascular Disease

## 2016-07-14 ENCOUNTER — Ambulatory Visit (INDEPENDENT_AMBULATORY_CARE_PROVIDER_SITE_OTHER): Payer: 59 | Admitting: Cardiovascular Disease

## 2016-07-14 VITALS — BP 118/78 | HR 78 | Temp 98.5°F | Resp 16 | Ht 66.0 in | Wt 252.0 lb

## 2016-07-14 VITALS — BP 122/64 | HR 97 | Ht 66.0 in | Wt 252.0 lb

## 2016-07-14 DIAGNOSIS — I10 Essential (primary) hypertension: Secondary | ICD-10-CM

## 2016-07-14 DIAGNOSIS — F411 Generalized anxiety disorder: Secondary | ICD-10-CM

## 2016-07-14 DIAGNOSIS — R079 Chest pain, unspecified: Secondary | ICD-10-CM | POA: Diagnosis not present

## 2016-07-14 DIAGNOSIS — R7982 Elevated C-reactive protein (CRP): Secondary | ICD-10-CM

## 2016-07-14 DIAGNOSIS — J069 Acute upper respiratory infection, unspecified: Secondary | ICD-10-CM

## 2016-07-14 DIAGNOSIS — R760 Raised antibody titer: Secondary | ICD-10-CM | POA: Diagnosis not present

## 2016-07-14 DIAGNOSIS — F5101 Primary insomnia: Secondary | ICD-10-CM | POA: Diagnosis not present

## 2016-07-14 DIAGNOSIS — E559 Vitamin D deficiency, unspecified: Secondary | ICD-10-CM

## 2016-07-14 DIAGNOSIS — E8881 Metabolic syndrome: Secondary | ICD-10-CM | POA: Diagnosis not present

## 2016-07-14 DIAGNOSIS — M62838 Other muscle spasm: Secondary | ICD-10-CM | POA: Diagnosis not present

## 2016-07-14 MED ORDER — ZOLPIDEM TARTRATE 10 MG PO TABS: 5.0000 mg | ORAL_TABLET | Freq: Every day | ORAL | 0 refills | Status: DC

## 2016-07-14 MED ORDER — BECLOMETHASONE DIPROPIONATE 40 MCG/ACT IN AERS: 2.0000 | INHALATION_SPRAY | Freq: Two times a day (BID) | RESPIRATORY_TRACT | 0 refills | Status: DC

## 2016-07-14 MED ORDER — METOPROLOL SUCCINATE ER 25 MG PO TB24: 25.0000 mg | ORAL_TABLET | Freq: Every day | ORAL | 0 refills | Status: DC

## 2016-07-14 MED ORDER — LIRAGLUTIDE 18 MG/3ML ~~LOC~~ SOPN: 1.8000 mg | PEN_INJECTOR | Freq: Every day | SUBCUTANEOUS | 2 refills | Status: DC

## 2016-07-14 MED ORDER — HYDROCHLOROTHIAZIDE 25 MG PO TABS: 25.0000 mg | ORAL_TABLET | Freq: Every day | ORAL | 1 refills | Status: DC

## 2016-07-14 NOTE — Patient Instructions (Addendum)
Your physician recommends that you continue on your current medications as directed. Please refer to the Current Medication list given to you today.  Your physician has requested that you have en exercise stress myoview. For further information please visit [email or web address]. Please follow instruction sheet, as given. This will be done at [address], [address].   Your physician recommends that you schedule a follow-up appointment in: one month with Dr. Duke Salviaandolph

## 2016-07-14 NOTE — Progress Notes (Signed)
Name: Sharon Owen   MRN: [id number]    DOB: [birth year]   Date:07/14/2016       Progress Note  Subjective  Chief Complaint  Chief Complaint  Patient presents with  . Chest Pain    1 week follow up feels better said medication is helping  . Sore Throat    scratchy and itchy started this morning    HPI  HTN:  She went to [facility] with chest pain 3 weeks ago, is now on Toprol and back on HCTZ and bp is at goal, headache has improved, still has intermittent chest pain and palpitation but not lasting as long or happening as frequently  Insomnia: taking Ambien half to one pill prn she states usually sleeps well. No side effects of medications, but explained FDA guidelines and the need to try weaning down on dose to 5 mg daily   Obesity: she states she has been obesity since she turned 51 years old. She was in the 160's lbs before she had children. She has tried weight watchers, Adipex, Qsymia ( raised her bp ), Atkins diet.  She is currently eating lean meat, increasing vegetables and juicing. She was seen 09/2015 weight is 262 lbs, we gave her Kirke Shaggy and it worked for her, but insurance denied coverage, but has been covering Victoza for pre-diabetes, she has been off medication for the past few weeks, still losing weight, down another 10 lbs since last June. She states medication is curbing her appetite, she is eating a high protein diet, only having 3 meals daily and is wearing a fitbit.  She has lost 31 lbs since started on medication.   Metabolic Syndrome: she is doing well on Victoza, no polyphagia, polydipsia or polyuria on medication, last hgbA1C was 5.9% in Feb 2017, we will recheck level and also add fasting insulin  Sore throat: she states she noticed sore throat, nasal drainage, clear or yellow rhinorrhea this morning. No fever or chills. She also had a mild cough  Elevated C-reactive protein and lupus anticoagulant: we checked last year because of recurrent episcleritis, never saw  rheumatologist, but states no episodes of episcleritis in the past year.   Patient Active Problem List   Diagnosis Date Noted  . Hyperlipidemia 06/25/2016  . Hypertensive emergency 06/24/2016  . Chest pain 06/24/2016  . Metabolic syndrome 38/75/6433  . Lupus anticoagulant positive 11/19/2015  . Elevated C-reactive protein 07/08/2015  . Perimenopause 05/28/2015  . Morbid obesity ([place]) 03/11/2015  . Gastro-esophageal reflux disease without esophagitis 03/11/2015  . Anxiety 03/11/2015  . Insomnia, persistent 03/11/2015  . Osteoarthritis of both knees 03/11/2015  . Episcleritis 03/11/2015  . History of fracture of tibia 05/23/2014  . History of sprain of ankle 05/23/2014  . S/P right knee arthroscopy 12/28/2011  . Medial meniscus, posterior horn derangement 12/01/2011  . Mononeuritis leg 07/17/2011  . Night muscle spasms 07/17/2011    Past Surgical History:  Procedure Laterality Date  . CESAREAN SECTION  1988  . CHONDROPLASTY  12/24/2011   Procedure: CHONDROPLASTY;  Surgeon: Carole Civil, MD;  Location: [facility];  Service: Orthopedics;  Laterality: Right;  . DILATION AND CURETTAGE OF UTERUS     "more than 1"  . FRACTURE SURGERY    . INTRAUTERINE DEVICE INSERTION  ~ 2016  . TIBIA IM NAIL INSERTION Right 05/23/2014   Procedure: INTRAMEDULLARY (IM) NAIL RIGHT TIBIA;  Surgeon: Alta Corning, MD;  Location: [facility];  Service: Orthopedics;  Laterality: Right;    Family History  Problem Relation Age of Onset  . Hypertension Mother   . Hypertension Father   . Diabetes Father   . Stroke Father   . Insomnia Sister   . Hypertension Sister   . Diabetes Sister   . Heart failure Maternal Grandfather   . Diabetes Paternal Grandfather   . Heart attack Paternal Grandfather     Social History   Social History  . Marital status: Married    Spouse name: N/A  . Number of children: N/A  . Years of education: N/A   Occupational History  . Not on file.   Social History Main Topics   . Smoking status: Never Smoker  . Smokeless tobacco: Never Used  . Alcohol use 2.4 oz/week    2 Cans of beer, 2 Shots of liquor per week  . Drug use: No  . Sexual activity: Yes   Other Topics Concern  . Not on file   Social History Narrative   Married, working two jobs ( taking one extra shift to help pay for daughter' college)        Current Outpatient Prescriptions:  .  ALPRAZolam (XANAX) 0.5 MG tablet, TAKE HALF TO ONE TABLET BY MOUTH EVERY 4 HOURS AS NEEDED FOR ANXIETY, Disp: 30 tablet, Rfl: 0 .  aspirin EC 81 MG tablet, Take 1 tablet (81 mg total) by mouth daily., Disp: 30 tablet, Rfl: 0 .  Black Cohosh 200 MG CAPS, Take 1 capsule (200 mg total) by mouth 2 (two) times daily., Disp: 60 capsule, Rfl: 0 .  cyclobenzaprine (FLEXERIL) 10 MG tablet, Take 1 tablet (10 mg total) by mouth at bedtime., Disp: 90 tablet, Rfl: 0 .  diclofenac (VOLTAREN) 50 MG EC tablet, Take 50 mg by mouth 2 (two) times daily., Disp: , Rfl:  .  diphenhydrAMINE (BENADRYL) 25 MG tablet, Take 25 mg by mouth every 6 (six) hours as needed. Reported on 05/28/2015, Disp: , Rfl:  .  esomeprazole (NEXIUM) 40 MG capsule, Take 1 capsule (40 mg total) by mouth daily at 12 noon. Reported on 05/28/2015, Disp: 30 capsule, Rfl: 5 .  hydrochlorothiazide (HYDRODIURIL) 25 MG tablet, Take 1 tablet (25 mg total) by mouth daily., Disp: 90 tablet, Rfl: 1 .  Insulin Pen Needle 32G X 6 MM MISC, 1 each by Does not apply route daily., Disp: 100 each, Rfl: 0 .  liraglutide (VICTOZA) 18 MG/3ML SOPN, Inject 0.3 mLs (1.8 mg total) into the skin daily., Disp: 9 mL, Rfl: 2 .  metoprolol succinate (TOPROL-XL) 25 MG 24 hr tablet, Take 1 tablet (25 mg total) by mouth at bedtime., Disp: 30 tablet, Rfl: 0 .  zolpidem (AMBIEN) 10 MG tablet, Take 0.5-1 tablets (5-10 mg total) by mouth at bedtime., Disp: 90 tablet, Rfl: 0  Allergies  Allergen Reactions  . Adhesive [Tape] Itching  . Latex Itching  . Meloxicam     Elevated BP  . Gabapentin Rash  .  Morphine And Related Rash     ROS  Constitutional: Negative for fever or significant  weight change.  Respiratory: Negative for cough and shortness of breath.   Cardiovascular: Positive for intermittent  chest pain and  palpitations.  Gastrointestinal: Negative for abdominal pain, no bowel changes.  Musculoskeletal: Negative for gait problem or joint swelling.  She has bilaterally knee pain - chronic Skin: Negative for rash.  Neurological: Negative for dizziness, intermittent  headache.  No other specific complaints in a complete review of systems (except as listed in HPI above).  Objective  Vitals:  07/14/16 1438  BP: 118/78  Pulse: 78  Resp: 16  Temp: 98.5 F (36.9 C)  SpO2: 98%  Weight: 252 lb (114.3 kg)  Height: '5\' 6"'  (1.676 m)    Body mass index is 40.67 kg/m.  Physical Exam  Constitutional: Patient appears well-developed and well-nourished. Obese  No distress.  HEENT: head atraumatic, normocephalic, pupils equal and reactive to light,  neck supple, throat within normal limits Cardiovascular: Normal rate, regular rhythm and normal heart sounds.  No murmur heard. No BLE edema. Pulmonary/Chest: Effort normal and breath sounds normal. No respiratory distress. Abdominal: Soft.  There is no tenderness. Psychiatric: Patient has a normal mood and affect. behavior is normal. Judgment and thought content normal.  Recent Results (from the past 2160 hour(s))  CBC     Status: None   Collection Time: 06/24/16 10:29 AM  Result Value Ref Range   WBC 10.0 4.0 - 10.5 K/uL   RBC 4.51 3.87 - 5.11 MIL/uL   Hemoglobin 13.3 12.0 - 15.0 g/dL   HCT 40.9 36.0 - 46.0 %   MCV 90.7 78.0 - 100.0 fL   MCH 29.5 26.0 - 34.0 pg   MCHC 32.5 30.0 - 36.0 g/dL   RDW 14.8 11.5 - 15.5 %   Platelets 299 150 - 400 K/uL  Comprehensive metabolic panel     Status: Abnormal   Collection Time: 06/24/16 10:29 AM  Result Value Ref Range   Sodium 139 135 - 145 mmol/L   Potassium 3.4 (L) 3.5 - 5.1  mmol/L   Chloride 100 (L) 101 - 111 mmol/L   CO2 29 22 - 32 mmol/L   Glucose, Bld 99 65 - 99 mg/dL   BUN 13 6 - 20 mg/dL   Creatinine, Ser 0.75 0.44 - 1.00 mg/dL   Calcium 9.5 8.9 - 10.3 mg/dL   Total Protein 7.5 6.5 - 8.1 g/dL   Albumin 3.9 3.5 - 5.0 g/dL   AST 20 15 - 41 U/L   ALT 14 14 - 54 U/L   Alkaline Phosphatase 94 38 - 126 U/L   Total Bilirubin 0.8 0.3 - 1.2 mg/dL   GFR calc non Af Amer >60 >60 mL/min   GFR calc Af Amer >60 >60 mL/min    Comment: (NOTE) The eGFR has been calculated using the CKD EPI equation. This calculation has not been validated in all clinical situations. eGFR's persistently <60 mL/min signify possible Chronic Kidney Disease.    Anion gap 10 5 - 15  D-dimer, quantitative (not at [facility])     Status: None   Collection Time: 06/24/16 10:29 AM  Result Value Ref Range   D-Dimer, Quant 0.34 0.00 - 0.50 ug/mL-FEU    Comment: (NOTE) At the manufacturer cut-off of 0.50 ug/mL FEU, this assay has been documented to exclude PE with a sensitivity and negative predictive value of 97 to 99%.  At this time, this assay has not been approved by the FDA to exclude DVT/VTE. Results should be correlated with clinical presentation.   I-stat troponin, ED     Status: None   Collection Time: 06/24/16 10:49 AM  Result Value Ref Range   Troponin i, poc 0.00 0.00 - 0.08 ng/mL   Comment 3            Comment: Due to the release kinetics of cTnI, a negative result within the first hours of the onset of symptoms does not rule out myocardial infarction with certainty. If myocardial infarction is still suspected, repeat the test at  appropriate intervals.   HIV antibody (Routine Testing)     Status: None   Collection Time: 06/24/16  3:59 PM  Result Value Ref Range   HIV Screen 4th Generation wRfx Non Reactive Non Reactive    Comment: (NOTE) Performed At: [facility] [place], [place] [id number] Lindon Romp MD YP:[id number]   Troponin I      Status: None   Collection Time: 06/24/16  6:05 PM  Result Value Ref Range   Troponin I <0.03 <0.03 ng/mL  Basic metabolic panel     Status: Abnormal   Collection Time: 06/24/16  6:05 PM  Result Value Ref Range   Sodium 139 135 - 145 mmol/L   Potassium 3.7 3.5 - 5.1 mmol/L   Chloride 104 101 - 111 mmol/L   CO2 28 22 - 32 mmol/L   Glucose, Bld 102 (H) 65 - 99 mg/dL   BUN 16 6 - 20 mg/dL   Creatinine, Ser 0.76 0.44 - 1.00 mg/dL   Calcium 9.3 8.9 - 10.3 mg/dL   GFR calc non Af Amer >60 >60 mL/min   GFR calc Af Amer >60 >60 mL/min    Comment: (NOTE) The eGFR has been calculated using the CKD EPI equation. This calculation has not been validated in all clinical situations. eGFR's persistently <60 mL/min signify possible Chronic Kidney Disease.    Anion gap 7 5 - 15  Troponin I     Status: None   Collection Time: 06/25/16 12:18 AM  Result Value Ref Range   Troponin I <0.03 <0.03 ng/mL  Lipid panel     Status: Abnormal   Collection Time: 06/25/16  8:26 AM  Result Value Ref Range   Cholesterol 202 (H) 0 - 200 mg/dL   Triglycerides 121 <150 mg/dL   HDL 46 >40 mg/dL   Total CHOL/HDL Ratio 4.4 RATIO   VLDL 24 0 - 40 mg/dL   LDL Cholesterol 132 (H) 0 - 99 mg/dL    Comment:        Total Cholesterol/HDL:CHD Risk Coronary Heart Disease Risk Table                     Men   Women  1/2 Average Risk   3.4   3.3  Average Risk       5.0   4.4  2 X Average Risk   9.6   7.1  3 X Average Risk  23.4   11.0        Use the calculated Patient Ratio above and the CHD Risk Table to determine the patient's CHD Risk.        ATP III CLASSIFICATION (LDL):  <100     mg/dL   Optimal  100-129  mg/dL   Near or Above                    Optimal  130-159  mg/dL   Borderline  160-189  mg/dL   High  >190     mg/dL   Very High   TSH     Status: None   Collection Time: 06/25/16  8:26 AM  Result Value Ref Range   TSH 2.667 0.350 - 4.500 uIU/mL    Comment: Performed by a 3rd Generation assay with a  functional sensitivity of <=0.01 uIU/mL.      PHQ2/9: Depression screen [facility] 2/9 07/07/2016 10/06/2015 07/09/2015 05/28/2015 03/11/2015  Decreased Interest 0 0 0 0 0  Down, Depressed, Hopeless 0  0 0 0 0  PHQ - 2 Score 0 0 0 0 0     Fall Risk: Fall Risk  07/07/2016 10/06/2015 07/09/2015 05/28/2015 03/11/2015  Falls in the past year? No No No No Yes  Number falls in past yr: - - - - 1  Injury with Fall? - - - - Yes     Assessment & Plan  1. Chest pain, unspecified type  Seen by cardiologist today and will have stress test - metoprolol succinate (TOPROL-XL) 25 MG 24 hr tablet; Take 1 tablet (25 mg total) by mouth at bedtime.  Dispense: 90 tablet; Refill: 0  2. Hypertension, benign  Doing well - hydrochlorothiazide (HYDRODIURIL) 25 MG tablet; Take 1 tablet (25 mg total) by mouth daily.  Dispense: 90 tablet; Refill: 1 - metoprolol succinate (TOPROL-XL) 25 MG 24 hr tablet; Take 1 tablet (25 mg total) by mouth at bedtime.  Dispense: 90 tablet; Refill: 0  3. GAD (generalized anxiety disorder)  Doing better, taking alprazolam, but no longer taking it daily, advised to take it only if she has palpitation and anxiety with chest pain  4. Primary insomnia  - zolpidem (AMBIEN) 10 MG tablet; Take 0.5-1 tablets (5-10 mg total) by mouth at bedtime.  Dispense: 90 tablet; Refill: 0  5. Morbid obesity, unspecified obesity type ([place])  - liraglutide (VICTOZA) 18 MG/3ML SOPN; Inject 0.3 mLs (1.8 mg total) into the skin daily.  Dispense: 9 mL; Refill: 2 - Hemoglobin A1c - Insulin, fasting  6. Metabolic syndrome  - liraglutide (VICTOZA) 18 MG/3ML SOPN; Inject 0.3 mLs (1.8 mg total) into the skin daily.  Dispense: 9 mL; Refill: 2 - Hemoglobin A1c - Insulin, fasting  7. Night muscle spasms  Taking Flexeril prn   8. Elevated C-reactive protein  - C-reactive protein  9. Lupus anticoagulant positive  - Lupus anticoagulant panel  10. Vitamin D deficiency  - VITAMIN D 25 Hydroxy (Vit-D  Deficiency, Fractures)   11. Acute URI  - beclomethasone (QVAR) 40 MCG/ACT inhaler; Inhale 2 puffs into the lungs 2 (two) times daily.  Dispense: 1 Inhaler; Refill: 0

## 2016-07-15 ENCOUNTER — Encounter: Payer: 59 | Admitting: Obstetrics and Gynecology

## 2016-07-15 ENCOUNTER — Other Ambulatory Visit: Payer: Self-pay | Admitting: Family Medicine

## 2016-07-20 ENCOUNTER — Telehealth (HOSPITAL_COMMUNITY): Payer: Self-pay

## 2016-07-20 NOTE — Telephone Encounter (Signed)
Encounter complete. 

## 2016-07-22 ENCOUNTER — Ambulatory Visit
Admission: RE | Admit: 2016-07-22 | Discharge: 2016-07-22 | Disposition: A | Discharge status: Home / Self Care | Payer: 59 | Source: Ambulatory Visit | Attending: Family Medicine | Admitting: Family Medicine

## 2016-07-22 ENCOUNTER — Inpatient Hospital Stay (HOSPITAL_COMMUNITY): Admission: RE | Admit: 2016-07-22 | Payer: 59 | Source: Ambulatory Visit

## 2016-07-22 DIAGNOSIS — Z1239 Encounter for other screening for malignant neoplasm of breast: Secondary | ICD-10-CM

## 2016-07-23 ENCOUNTER — Inpatient Hospital Stay (HOSPITAL_COMMUNITY): Admission: RE | Admit: 2016-07-23 | Payer: 59 | Source: Ambulatory Visit

## 2016-07-23 ENCOUNTER — Other Ambulatory Visit: Payer: Self-pay | Admitting: Family Medicine

## 2016-07-23 DIAGNOSIS — R928 Other abnormal and inconclusive findings on diagnostic imaging of breast: Secondary | ICD-10-CM

## 2016-07-26 ENCOUNTER — Telehealth: Payer: Self-pay

## 2016-07-26 NOTE — Telephone Encounter (Signed)
Order signed.

## 2016-07-26 NOTE — Telephone Encounter (Signed)
Patient had a screening mammogram and [facility] put in an order for [place] of Left Breast. Patient has an appointment for tomorrow 07/27/16 at 10:30 a.m. They sent this order to Dr. Carlynn Purl to sign but since she is not in the office, the patient called to see if another MD could sign so she could proceed with tomorrow ultrasound. Thanks

## 2016-07-27 ENCOUNTER — Ambulatory Visit
Admission: RE | Admit: 2016-07-27 | Discharge: 2016-07-27 | Disposition: A | Discharge status: Home / Self Care | Payer: 59 | Source: Ambulatory Visit | Attending: Family Medicine | Admitting: Family Medicine

## 2016-07-27 ENCOUNTER — Other Ambulatory Visit: Payer: Self-pay | Admitting: Family Medicine

## 2016-07-27 DIAGNOSIS — R928 Other abnormal and inconclusive findings on diagnostic imaging of breast: Secondary | ICD-10-CM

## 2016-07-27 DIAGNOSIS — R202 Paresthesia of skin: Secondary | ICD-10-CM

## 2016-07-27 DIAGNOSIS — R42 Dizziness and giddiness: Secondary | ICD-10-CM

## 2016-07-28 ENCOUNTER — Ambulatory Visit
Admission: RE | Admit: 2016-07-28 | Discharge: 2016-07-28 | Disposition: A | Discharge status: Home / Self Care | Payer: 59 | Source: Ambulatory Visit | Attending: Family Medicine | Admitting: Family Medicine

## 2016-07-28 ENCOUNTER — Other Ambulatory Visit: Payer: Self-pay | Admitting: Family Medicine

## 2016-07-28 DIAGNOSIS — R921 Mammographic calcification found on diagnostic imaging of breast: Secondary | ICD-10-CM

## 2016-07-28 DIAGNOSIS — R202 Paresthesia of skin: Secondary | ICD-10-CM

## 2016-07-28 DIAGNOSIS — R42 Dizziness and giddiness: Secondary | ICD-10-CM

## 2016-08-03 ENCOUNTER — Telehealth (HOSPITAL_COMMUNITY): Payer: Self-pay

## 2016-08-03 NOTE — Telephone Encounter (Signed)
Encounter complete. 

## 2016-08-05 ENCOUNTER — Inpatient Hospital Stay (HOSPITAL_COMMUNITY): Admission: RE | Admit: 2016-08-05 | Payer: 59 | Source: Ambulatory Visit

## 2016-08-06 ENCOUNTER — Telehealth (HOSPITAL_COMMUNITY): Payer: Self-pay

## 2016-08-06 ENCOUNTER — Inpatient Hospital Stay (HOSPITAL_COMMUNITY): Admission: RE | Admit: 2016-08-06 | Payer: 59 | Source: Ambulatory Visit

## 2016-08-06 ENCOUNTER — Encounter: Payer: Self-pay | Admitting: Family Medicine

## 2016-08-06 NOTE — Telephone Encounter (Signed)
Encounter complete. 

## 2016-08-11 ENCOUNTER — Ambulatory Visit (HOSPITAL_COMMUNITY)
Admission: RE | Admit: 2016-08-11 | Discharge: 2016-08-11 | Disposition: A | Discharge status: Home / Self Care | Payer: 59 | Source: Ambulatory Visit | Attending: Cardiovascular Disease | Admitting: Cardiovascular Disease

## 2016-08-11 DIAGNOSIS — R079 Chest pain, unspecified: Secondary | ICD-10-CM | POA: Insufficient documentation

## 2016-08-11 MED ORDER — TECHNETIUM TC 99M TETROFOSMIN IV KIT
29.8000 | PACK | Freq: Once | INTRAVENOUS | Status: AC | PRN
  Administered 2016-08-11: 29.8 via INTRAVENOUS
  Filled 2016-08-11: qty 30

## 2016-08-12 ENCOUNTER — Ambulatory Visit (HOSPITAL_COMMUNITY)
Admission: RE | Admit: 2016-08-12 | Discharge: 2016-08-12 | Disposition: A | Discharge status: Home / Self Care | Payer: 59 | Source: Ambulatory Visit | Attending: Cardiovascular Disease | Admitting: Cardiovascular Disease

## 2016-08-12 LAB — MYOCARDIAL PERFUSION IMAGING
Estimated workload: 8.4 METS
Exercise duration (min): 7 min
Exercise duration (sec): 30 s
LV dias vol: 87 mL (ref 46–106)
LV sys vol: 27 mL
MPHR: 170 {beats}/min
Peak HR: 155 {beats}/min
Percent HR: 88 %
RPE: 17
Rest HR: 67 {beats}/min
SDS: 2
SRS: 0
SSS: 2
TID: 0.82

## 2016-08-12 MED ORDER — TECHNETIUM TC 99M TETROFOSMIN IV KIT
28.0000 | PACK | Freq: Once | INTRAVENOUS | Status: AC | PRN
  Administered 2016-08-12: 28 via INTRAVENOUS

## 2016-08-16 ENCOUNTER — Ambulatory Visit: Payer: 59 | Admitting: Cardiovascular Disease

## 2016-08-19 ENCOUNTER — Ambulatory Visit: Payer: 59 | Admitting: Family Medicine

## 2016-08-31 ENCOUNTER — Ambulatory Visit: Payer: 59 | Admitting: Cardiovascular Disease

## 2016-08-31 NOTE — Progress Notes (Deleted)
Cardiology Office Note   Date:  08/31/2016   ID:  Sharon BlackbirdCassandra F Cancelliere, DOB 03/20/1966, MRN [id number]  PCP:  Alba CorySowles, Krichna, MD  Cardiologist:   Chilton Siiffany [place], MD   No chief complaint on file.     History of Present Illness: Sharon Owen is a 51 y.o. female with hypertension, morbid obesity and hyperlipidemia who presents for follow up.  She was initially seen 06/2016 for management of hypertension and chest pain.  Ms. Manson PasseyBrown was seen in the ED 06/25/16 with chest pain in the setting of being off her antihypertension medications and under a lot of stress.  Cardiac enzymes were negative and her EKG was negative for ischemia.  Hydrochlorothiazide was restarted and she followed up with Dr. Alba CoryKrichna Sowles on 3/14.  She was referred for an exercise Myoview 08/12/16 that revealed LVEF 69% and no ischemia.  She achieved 8.4 METS.  She had an echo 07/2015 that revealed LVEF 60-65% with mild MR and was otherwise normal.  Her lipids were elevated and her ASCVD 10 year risk was 9.1%.  She is working on diet and exercise x3 months prior to repeating these labs.   Past Medical History:  Diagnosis Date  . Anxiety   . Chronic osteoarthritis    "knees" (06/24/2016)  . Complication of anesthesia    "I'm hard to wake up" (06/24/2016)  . Elevated blood pressure   . Episclerotitis, right    "of eye"  . GERD (gastroesophageal reflux disease)   . Headache    "monthly at least" (06/24/2016)  . Hypertension   . Insomnia   . Metabolic syndrome   . Morbid obesity (HCC)     Past Surgical History:  Procedure Laterality Date  . CESAREAN SECTION  1988  . CHONDROPLASTY  12/24/2011   Procedure: CHONDROPLASTY;  Surgeon: Vickki HearingStanley E Harrison, MD;  Location: [facility];  Service: Orthopedics;  Laterality: Right;  . DILATION AND CURETTAGE OF UTERUS     "more than 1"  . FRACTURE SURGERY    . INTRAUTERINE DEVICE INSERTION  ~ 2016  . TIBIA IM NAIL INSERTION Right 05/23/2014   Procedure: INTRAMEDULLARY (IM) NAIL RIGHT  TIBIA;  Surgeon: Harvie JuniorJohn L Graves, MD;  Location: [facility];  Service: Orthopedics;  Laterality: Right;     Current Outpatient Prescriptions  Medication Sig Dispense Refill  . ALPRAZolam (XANAX) 0.5 MG tablet TAKE HALF TO ONE TABLET BY MOUTH EVERY 4 HOURS AS NEEDED FOR ANXIETY 30 tablet 0  . aspirin EC 81 MG tablet Take 1 tablet (81 mg total) by mouth daily. 30 tablet 0  . beclomethasone (QVAR) 40 MCG/ACT inhaler Inhale 2 puffs into the lungs 2 (two) times daily. 1 Inhaler 0  . Black Cohosh 200 MG CAPS Take 1 capsule (200 mg total) by mouth 2 (two) times daily. 60 capsule 0  . cyclobenzaprine (FLEXERIL) 10 MG tablet Take 1 tablet (10 mg total) by mouth at bedtime. 90 tablet 0  . diclofenac (VOLTAREN) 50 MG EC tablet Take 50 mg by mouth 2 (two) times daily.    . diphenhydrAMINE (BENADRYL) 25 MG tablet Take 25 mg by mouth every 6 (six) hours as needed. Reported on 05/28/2015    . esomeprazole (NEXIUM) 40 MG capsule Take 1 capsule (40 mg total) by mouth daily at 12 noon. Reported on 05/28/2015 30 capsule 5  . hydrochlorothiazide (HYDRODIURIL) 25 MG tablet Take 1 tablet (25 mg total) by mouth daily. 90 tablet 1  . liraglutide (VICTOZA) 18 MG/3ML SOPN Inject 0.3  mLs (1.8 mg total) into the skin daily. 9 mL 2  . metoprolol succinate (TOPROL-XL) 25 MG 24 hr tablet Take 1 tablet (25 mg total) by mouth at bedtime. 90 tablet 0  . NOVOFINE 32G X 6 MM MISC USE AS DIRECTED 100 each 0  . zolpidem (AMBIEN) 10 MG tablet Take 0.5-1 tablets (5-10 mg total) by mouth at bedtime. 90 tablet 0   No current facility-administered medications for this visit.     Allergies:   Adhesive [tape]; Latex; Meloxicam; Gabapentin; and Morphine and related    Social History:  The patient  reports that she has never smoked. She has never used smokeless tobacco. She reports that she drinks about 2.4 oz of alcohol per week . She reports that she does not use drugs.   Family History:  The patient's family history includes Diabetes in her  father, paternal grandfather, and sister; Heart attack in her paternal grandfather; Heart failure in her maternal grandfather; Hypertension in her father, mother, and sister; Insomnia in her sister; Stroke in her father.    ROS:  Please see the history of present illness.   Otherwise, review of systems are positive for none.   All other systems are reviewed and negative.    PHYSICAL EXAM: VS:  There were no vitals taken for this visit. , BMI There is no height or weight on file to calculate BMI. GENERAL:  Well appearing HEENT:  Pupils equal round and reactive, fundi not visualized, oral mucosa unremarkable NECK:  No jugular venous distention, waveform within normal limits, carotid upstroke brisk and symmetric, no bruits, no thyromegaly LYMPHATICS:  No cervical adenopathy LUNGS:  Clear to auscultation bilaterally HEART:  RRR.  PMI not displaced or sustained,S1 and S2 within normal limits, no S3, no S4, no clicks, no rubs, no murmurs ABD:  Flat, positive bowel sounds normal in frequency in pitch, no bruits, no rebound, no guarding, no midline pulsatile mass, no hepatomegaly, no splenomegaly EXT:  2 plus pulses throughout, no edema, no cyanosis no clubbing SKIN:  No rashes no nodules NEURO:  Cranial nerves II through XII grossly intact, motor grossly intact throughout PSYCH:  Cognitively intact, oriented to person place and time   EKG:  EKG is not ordered today.  Exercise Myoveiw 08/12/16:  The left ventricular ejection fraction is hyperdynamic (>65%).  Nuclear stress EF: 69%.  Blood pressure demonstrated a normal response to exercise.  There was 1mm of J point depression with upsloping ST segement depression at peak exercise  The perfusion study is normal with no ischemia.  This is a low risk study.     Recent Labs: 06/24/2016: ALT 14; BUN 16; Creatinine, Ser 0.76; Hemoglobin 13.3; Platelets 299; Potassium 3.7; Sodium 139 06/25/2016: TSH 2.667    Lipid Panel    Component Value  Date/Time   CHOL 202 (H) 06/25/2016 0826   CHOL 192 05/28/2015 0944   TRIG 121 06/25/2016 0826   HDL 46 06/25/2016 0826   HDL 49 05/28/2015 0944   CHOLHDL 4.4 06/25/2016 0826   VLDL 24 06/25/2016 0826   LDLCALC 132 (H) 06/25/2016 0826   LDLCALC 127 (H) 05/28/2015 0944      Wt Readings from Last 3 Encounters:  08/11/16 114.3 kg (252 lb)  07/14/16 114.3 kg (252 lb)  07/14/16 114.3 kg (252 lb)      ASSESSMENT AND PLAN:  # Atypical chest pain:  We will obtain obtain an exercise Myoview to better evaluate. Continue aspirin.   # Hypertension: Blood pressure  is better-controlled. On repeat it was 122/64, which is similar to what she has been sitting at home. Continue hydrochlorothiazide and metoprolol.  # Palpitations: Better-controlled on metoprolol. She wore an event monitor in the past and had a severe reaction to the adhesive. She is not interested in trying this again.  # Hyperlipidemia: ASCVD 10 year risk 9%.  Continue aspirin 81 mg daily.  Recommended diet and weight loss.  Repeat lipids in 3 months.   Current medicines are reviewed at length with the patient today.  The patient does not have concerns regarding medicines.  The following changes have been made:  no change  Labs/ tests ordered today include:   No orders of the defined types were placed in this encounter.    Disposition:   FU with Mckay Brandt C. Duke Salvia, MD, [facility] in 1 year    This note was written with the assistance of speech recognition software.  Please excuse any transcriptional errors.  Signed, Coree Riester C. Duke Salvia, MD, [facility]  08/31/2016 6:04 AM    [facility]

## 2016-09-04 ENCOUNTER — Other Ambulatory Visit: Payer: Self-pay | Admitting: Family Medicine

## 2016-09-04 DIAGNOSIS — J069 Acute upper respiratory infection, unspecified: Secondary | ICD-10-CM

## 2016-09-17 ENCOUNTER — Encounter: Payer: Self-pay | Admitting: Cardiovascular Disease

## 2016-09-17 ENCOUNTER — Ambulatory Visit (INDEPENDENT_AMBULATORY_CARE_PROVIDER_SITE_OTHER): Payer: 59 | Admitting: Cardiovascular Disease

## 2016-09-17 VITALS — BP 131/79 | HR 71 | Ht 65.0 in | Wt 253.0 lb

## 2016-09-17 DIAGNOSIS — I208 Other forms of angina pectoris: Secondary | ICD-10-CM | POA: Diagnosis not present

## 2016-09-17 DIAGNOSIS — R002 Palpitations: Secondary | ICD-10-CM

## 2016-09-17 DIAGNOSIS — I10 Essential (primary) hypertension: Secondary | ICD-10-CM

## 2016-09-17 MED ORDER — NEBIVOLOL HCL 10 MG PO TABS: 10.0000 mg | ORAL_TABLET | Freq: Every day | ORAL | 1 refills | Status: DC

## 2016-09-17 MED ORDER — NEBIVOLOL HCL 10 MG PO TABS: 10.0000 mg | ORAL_TABLET | Freq: Every day | ORAL | 5 refills | Status: DC

## 2016-09-17 NOTE — Progress Notes (Signed)
Cardiology Office Note   Date:  09/18/2016   ID:  Sharon Owen, DOB [birth year], MRN [id number]  PCP:  Sharon Cory, MD  Cardiologist:   Sharon Si, MD   Chief Complaint  Patient presents with  . Follow-up    1 month;  . Leg Pain    cramping in legs at night.       History of Present Illness: Sharon Owen is a 51 y.o. female with hypertension, morbid obesity and hyperlipidemia who presents for follow up.  She was initially seen 06/2016 for management of hypertension and chest pain.  Sharon Owen was seen in the ED 06/25/16 with chest pain in the setting of being off her antihypertension medications and under a lot of stress.  Cardiac enzymes were negative and her EKG was negative for ischemia.  Hydrochlorothiazide was restarted and she followed up with Dr. Alba Owen on 3/14.  She was referred for an exercise Myoview 08/12/16 that revealed LVEF 69% and no ischemia.  She achieved 8.4 METS.  She had an echo 07/2015 that revealed LVEF 60-65% with mild MR and was otherwise normal.  Her lipids were elevated and her ASCVD 10 year risk was 9.1%.  She is working on diet and exercise and doesn't want to start a statin.  Sharon Owen has been doing well.  She only has chest pain when she is upset or tired.  Her palpitations are better controlled.  She has short episodes when she doesn't get enough sleep or when she is stressed.  It is worse at night.  This occurs once or twice per week and lasts for several minutes.  She also reports fatigue since starting metoprolol.    Sharon Owen is considering the gastric sleeve procedure for weight loss.  She broke her leg several years ago and was in the bed for 7 months.  She has gained a lot of weight since that time and has a hard time exercising due to pain in her leg.  She thinks that the pain is exacerbated by her weight.    Past Medical History:  Diagnosis Date  . Anxiety   . Chronic osteoarthritis    "knees" (06/24/2016)  . Complication of  anesthesia    "I'm hard to wake up" (06/24/2016)  . Elevated blood pressure   . Episclerotitis, right    "of eye"  . GERD (gastroesophageal reflux disease)   . Headache    "monthly at least" (06/24/2016)  . Hypertension   . Insomnia   . Metabolic syndrome   . Morbid obesity (HCC)     Past Surgical History:  Procedure Laterality Date  . CESAREAN SECTION  1988  . CHONDROPLASTY  12/24/2011   Procedure: CHONDROPLASTY;  Surgeon: Vickki Hearing, MD;  Location: [facility];  Service: Orthopedics;  Laterality: Right;  . DILATION AND CURETTAGE OF UTERUS     "more than 1"  . FRACTURE SURGERY    . INTRAUTERINE DEVICE INSERTION  ~ 2016  . TIBIA IM NAIL INSERTION Right 05/23/2014   Procedure: INTRAMEDULLARY (IM) NAIL RIGHT TIBIA;  Surgeon: Harvie Junior, MD;  Location: [facility];  Service: Orthopedics;  Laterality: Right;     Current Outpatient Prescriptions  Medication Sig Dispense Refill  . ALPRAZolam (XANAX) 0.5 MG tablet TAKE HALF TO ONE TABLET BY MOUTH EVERY 4 HOURS AS NEEDED FOR ANXIETY 30 tablet 0  . aspirin EC 81 MG tablet Take 1 tablet (81 mg total) by mouth daily. 30 tablet 0  .  Black Cohosh 200 MG CAPS Take 1 capsule (200 mg total) by mouth 2 (two) times daily. 60 capsule 0  . cyclobenzaprine (FLEXERIL) 10 MG tablet Take 1 tablet (10 mg total) by mouth at bedtime. 90 tablet 0  . diclofenac (VOLTAREN) 50 MG EC tablet Take 50 mg by mouth 2 (two) times daily.    . diphenhydrAMINE (BENADRYL) 25 MG tablet Take 25 mg by mouth every 6 (six) hours as needed. Reported on 05/28/2015    . esomeprazole (NEXIUM) 40 MG capsule Take 1 capsule (40 mg total) by mouth daily at 12 noon. Reported on 05/28/2015 30 capsule 5  . hydrochlorothiazide (HYDRODIURIL) 25 MG tablet Take 1 tablet (25 mg total) by mouth daily. 90 tablet 1  . liraglutide (VICTOZA) 18 MG/3ML SOPN Inject 0.3 mLs (1.8 mg total) into the skin daily. 9 mL 2  . NOVOFINE 32G X 6 MM MISC USE AS DIRECTED 100 each 0  . QVAR REDIHALER 40 MCG/ACT AERB  INHALE 2 PUFFS INTO THE LUNGS 2 (TWO) TIMES DAILY. 1 Inhaler 0  . zolpidem (AMBIEN) 10 MG tablet Take 0.5-1 tablets (5-10 mg total) by mouth at bedtime. 90 tablet 0  . nebivolol (BYSTOLIC) 10 MG tablet Take 1 tablet (10 mg total) by mouth daily. 90 tablet 1   No current facility-administered medications for this visit.     Allergies:   Adhesive [tape]; Latex; Meloxicam; Gabapentin; and Morphine and related    Social History:  The patient  reports that she has never smoked. She has never used smokeless tobacco. She reports that she drinks about 2.4 oz of alcohol per week . She reports that she does not use drugs.   Family History:  The patient's family history includes Diabetes in her father, paternal grandfather, and sister; Heart attack in her paternal grandfather; Heart failure in her maternal grandfather; Hypertension in her father, mother, and sister; Insomnia in her sister; Stroke in her father.    ROS:  Please see the history of present illness.   Otherwise, review of systems are positive for none.   All other systems are reviewed and negative.    PHYSICAL EXAM: VS:  BP 131/79   Pulse 71   Ht 5\' 5"  (1.651 m)   Wt 114.8 kg (253 lb)   BMI 42.10 kg/m  , BMI Body mass index is 42.1 kg/m. GENERAL:  Well appearing.  No acute distress.  Morbidly obese HEENT:  Pupils equal round and reactive, fundi not visualized, oral mucosa unremarkable NECK:  No jugular venous distention, waveform within normal limits, carotid upstroke brisk and symmetric, no bruits LUNGS:  Clear to auscultation bilaterally.  No crackles, wheezes or rhonchi HEART:  RRR.  PMI not displaced or sustained,S1 and S2 within normal limits, no S3, no S4, no clicks, no rubs, no murmurs ABD:  Flat, positive bowel sounds normal in frequency in pitch, no bruits, no rebound, no guarding, no midline pulsatile mass, no hepatomegaly, no splenomegaly EXT:  2 plus pulses throughout, no edema, no cyanosis no clubbing SKIN:  No rashes  no nodules NEURO:  Cranial nerves II through XII grossly intact, motor grossly intact throughout PSYCH:  Cognitively intact, oriented to person place and time   EKG:  EKG is not ordered today.  Exercise Myoveiw 08/12/16:  The left ventricular ejection fraction is hyperdynamic (>65%).  Nuclear stress EF: 69%.  Blood pressure demonstrated a normal response to exercise.  There was 1mm of J point depression with upsloping ST segement depression at peak exercise  The perfusion study is normal with no ischemia.  This is a low risk study.     Recent Labs: 06/24/2016: ALT 14; BUN 16; Creatinine, Ser 0.76; Hemoglobin 13.3; Platelets 299; Potassium 3.7; Sodium 139 06/25/2016: TSH 2.667    Lipid Panel    Component Value Date/Time   CHOL 202 (H) 06/25/2016 0826   CHOL 192 05/28/2015 0944   TRIG 121 06/25/2016 0826   HDL 46 06/25/2016 0826   HDL 49 05/28/2015 0944   CHOLHDL 4.4 06/25/2016 0826   VLDL 24 06/25/2016 0826   LDLCALC 132 (H) 06/25/2016 0826   LDLCALC 127 (H) 05/28/2015 0944      Wt Readings from Last 3 Encounters:  09/17/16 114.8 kg (253 lb)  08/11/16 114.3 kg (252 lb)  07/14/16 114.3 kg (252 lb)      ASSESSMENT AND PLAN:  # Atypical chest pain:  Exercise Myoview was negative for ischemia.  Continue aspirin.  # Hypertension: Blood pressure is better-controlled. She has fatigue with metoprolol.  We will switch this to nebivolol.  Continue HCTZ.  # Palpitations: Better-controlled on metoprolol but she has fatigue so switching to nebivolol as above.  # Hyperlipidemia: ASCVD 10 year risk 9%.  Continue aspirin 81 mg daily.  She wants to avoid statins.  She is considering gastric sleeve procedure.   She is at acceptable risk for surgery.   Current medicines are reviewed at length with the patient today.  The patient does not have concerns regarding medicines.  The following changes have been made:  no change  Labs/ tests ordered today include:   No orders of the  defined types were placed in this encounter.    Disposition:   FU with Sharon Kimbrell C. Duke Salviaandolph, MD, [facility] in 1 month   This note was written with the assistance of speech recognition software.  Please excuse any transcriptional errors.  Signed, Sharon Holle C. Duke Salviaandolph, MD, [facility]  09/18/2016 2:47 PM    [facility]

## 2016-09-17 NOTE — Patient Instructions (Addendum)
Medication Instructions:  STOP METOPROLOL  START BYSTOLIC 10 MG DAILY   Labwork: NONE  Testing/Procedures: NONE  Follow-Up: Your physician recommends that you schedule a follow-up appointment in: 1 MONTH OR NEXT AVAILABLE   If you need a refill on your cardiac medications before your next appointment, please call your pharmacy.

## 2016-09-27 ENCOUNTER — Other Ambulatory Visit (HOSPITAL_COMMUNITY): Payer: Self-pay | Admitting: Surgery

## 2016-10-07 ENCOUNTER — Encounter: Payer: 59 | Admitting: Obstetrics and Gynecology

## 2016-10-08 ENCOUNTER — Other Ambulatory Visit: Payer: Self-pay | Admitting: Obstetrics and Gynecology

## 2016-10-08 ENCOUNTER — Encounter: Payer: Self-pay | Admitting: Obstetrics and Gynecology

## 2016-10-08 ENCOUNTER — Ambulatory Visit (INDEPENDENT_AMBULATORY_CARE_PROVIDER_SITE_OTHER): Payer: 59 | Admitting: Obstetrics and Gynecology

## 2016-10-08 VITALS — BP 109/66 | HR 88 | Ht 66.0 in | Wt 268.8 lb

## 2016-10-08 DIAGNOSIS — Z01419 Encounter for gynecological examination (general) (routine) without abnormal findings: Secondary | ICD-10-CM | POA: Diagnosis not present

## 2016-10-08 NOTE — Progress Notes (Signed)
Subjective:   Sharon Owen is a 51 y.o. 206P0020 African American female here for a routine well-woman exam.  No LMP recorded. Patient is not currently having periods (Reason: IUD).    Current complaints: occasional BV, hot flashes helped by black cohosh; getting ready for gastric sleeve. PCP: [facility]       doesn't desire labs  Social History: Sexual: heterosexual Marital Status: married Living situation: with family Occupation: [employer] Tobacco/alcohol: no tobacco use Illicit drugs: no history of illicit drug use  The following portions of the patient's history were reviewed and updated as appropriate: allergies, current medications, past family history, past medical history, past social history, past surgical history and problem list.  Past Medical History Past Medical History:  Diagnosis Date  . Anxiety   . Chronic osteoarthritis    "knees" (06/24/2016)  . Complication of anesthesia    "I'm hard to wake up" (06/24/2016)  . Elevated blood pressure   . Episclerotitis, right    "of eye"  . GERD (gastroesophageal reflux disease)   . Headache    "monthly at least" (06/24/2016)  . Hypertension   . Insomnia   . Metabolic syndrome   . Morbid obesity [facility](HCC)     Past Surgical History Past Surgical History:  Procedure Laterality Date  . CESAREAN SECTION  1988  . CHONDROPLASTY  12/24/2011   Procedure: CHONDROPLASTY;  Surgeon: Vickki HearingStanley E Harrison, MD;  Location: [facility];  Service: Orthopedics;  Laterality: Right;  . DILATION AND CURETTAGE OF UTERUS     "more than 1"  . FRACTURE SURGERY    . INTRAUTERINE DEVICE INSERTION  ~ 2016  . TIBIA IM NAIL INSERTION Right 05/23/2014   Procedure: INTRAMEDULLARY (IM) NAIL RIGHT TIBIA;  Surgeon: Harvie JuniorJohn L Graves, MD;  Location: [facility];  Service: Orthopedics;  Laterality: Right;    Gynecologic History [id number]  No LMP recorded. Patient is not currently having periods (Reason: IUD). Contraception: IUD Last Pap: 2017. Results were:  normal Last mammogram: 2018. Results were: abnormal, with negative biopsy  Obstetric History OB History  Gravida Para Term Preterm AB Living  6 4     2     SAB TAB Ectopic Multiple Live Births  2            # Outcome Date GA Lbr Len/2nd Weight Sex Delivery Anes PTL Lv  6 Para 2000    F Vag-Spont     5 SAB 1999          4 Para 1997    F Vag-Spont     3 SAB 1996          2 Para 1990    F Vag-Spont     1 Para 1988    M CS-Unspec         Current Medications Current Outpatient Prescriptions on File Prior to Visit  Medication Sig Dispense Refill  . ALPRAZolam (XANAX) 0.5 MG tablet TAKE HALF TO ONE TABLET BY MOUTH EVERY 4 HOURS AS NEEDED FOR ANXIETY 30 tablet 0  . aspirin EC 81 MG tablet Take 1 tablet (81 mg total) by mouth daily. 30 tablet 0  . cyclobenzaprine (FLEXERIL) 10 MG tablet Take 1 tablet (10 mg total) by mouth at bedtime. 90 tablet 0  . diclofenac (VOLTAREN) 50 MG EC tablet Take 50 mg by mouth 2 (two) times daily.    . diphenhydrAMINE (BENADRYL) 25 MG tablet Take 25 mg by mouth every 6 (six) hours as needed. Reported on 05/28/2015    .  esomeprazole (NEXIUM) 40 MG capsule Take 1 capsule (40 mg total) by mouth daily at 12 noon. Reported on 05/28/2015 30 capsule 5  . hydrochlorothiazide (HYDRODIURIL) 25 MG tablet Take 1 tablet (25 mg total) by mouth daily. 90 tablet 1  . nebivolol (BYSTOLIC) 10 MG tablet Take 1 tablet (10 mg total) by mouth daily. 90 tablet 1  . NOVOFINE 32G X 6 MM MISC USE AS DIRECTED 100 each 0  . zolpidem (AMBIEN) 10 MG tablet Take 0.5-1 tablets (5-10 mg total) by mouth at bedtime. 90 tablet 0  . liraglutide (VICTOZA) 18 MG/3ML SOPN Inject 0.3 mLs (1.8 mg total) into the skin daily. (Patient not taking: Reported on 10/08/2016) 9 mL 2   No current facility-administered medications on file prior to visit.     Review of Systems Patient denies any headaches, blurred vision, shortness of breath, chest pain, abdominal pain, problems with bowel movements, urination, or  intercourse.  Objective:  BP 109/66   Pulse 88   Ht 5\' 6"  (1.676 m)   Wt 268 lb 12.8 oz (121.9 kg)   BMI 43.39 kg/m  Physical Exam  General:  Well developed, well nourished, no acute distress. She is alert and oriented x3. Skin:  Warm and dry Neck:  Midline trachea, no thyromegaly or nodules Cardiovascular: Regular rate and rhythm, no murmur heard Lungs:  Effort normal, all lung fields clear to auscultation bilaterally Breasts:  No dominant palpable mass, retraction, or nipple discharge Abdomen:  Soft, non tender, no hepatosplenomegaly or masses Pelvic:  External genitalia is normal in appearance.  The vagina is normal in appearance. The cervix is bulbous, no CMT.  Thin prep pap is done with HR HPV cotesting. Uterus is felt to be normal size, shape, and contour.  No adnexal masses or tenderness noted. Extremities:  No swelling or varicosities noted Psych:  She has a normal mood and affect  Assessment:   Healthy well-woman exam IUD check Obesity   Plan:  Pap repeated per patient request F/U 1 year for AE, or sooner if needed   Chaynce Schafer Suzan Nailer, CNM

## 2016-10-13 ENCOUNTER — Ambulatory Visit: Payer: 59 | Admitting: Registered"

## 2016-10-13 LAB — CYTOLOGY - PAP

## 2016-10-15 ENCOUNTER — Other Ambulatory Visit: Payer: Self-pay | Admitting: Family Medicine

## 2016-10-15 ENCOUNTER — Encounter: Payer: Self-pay | Admitting: Family Medicine

## 2016-10-15 ENCOUNTER — Other Ambulatory Visit (HOSPITAL_COMMUNITY): Payer: 59

## 2016-10-15 ENCOUNTER — Ambulatory Visit (INDEPENDENT_AMBULATORY_CARE_PROVIDER_SITE_OTHER): Payer: 59 | Admitting: Family Medicine

## 2016-10-15 ENCOUNTER — Ambulatory Visit (HOSPITAL_COMMUNITY): Payer: 59

## 2016-10-15 VITALS — BP 110/68 | HR 66 | Temp 98.1°F | Resp 16 | Ht 66.0 in | Wt 266.1 lb

## 2016-10-15 DIAGNOSIS — F5101 Primary insomnia: Secondary | ICD-10-CM

## 2016-10-15 DIAGNOSIS — F411 Generalized anxiety disorder: Secondary | ICD-10-CM

## 2016-10-15 DIAGNOSIS — R7982 Elevated C-reactive protein (CRP): Secondary | ICD-10-CM | POA: Diagnosis not present

## 2016-10-15 DIAGNOSIS — I10 Essential (primary) hypertension: Secondary | ICD-10-CM

## 2016-10-15 DIAGNOSIS — E8881 Metabolic syndrome: Secondary | ICD-10-CM

## 2016-10-15 MED ORDER — ALPRAZOLAM 0.5 MG PO TABS: ORAL_TABLET | ORAL | 0 refills | Status: DC

## 2016-10-15 MED ORDER — ZOLPIDEM TARTRATE 10 MG PO TABS: 5.0000 mg | ORAL_TABLET | Freq: Every day | ORAL | 0 refills | Status: DC

## 2016-10-15 NOTE — Progress Notes (Signed)
Name: Sharon Owen   MRN: [id number]    DOB: [birth year]   Date:10/15/2016       Progress Note  Subjective  Chief Complaint  Chief Complaint  Patient presents with  . Obesity    3 month follow up pt plans to have bariatric surgery  . Hypertension  . Insomnia  . Anxiety    HPI  HTN:  She is on Toprol and  on HCTZ and bp is towards low end of normal, no dizziness but she has intermittent headaches twice a month.   Insomnia: taking Ambien half to one pill prn she states usually sleeps well. No side effects of medications, She is taking half to one pill daily   Obesity: she states she has been obesity since she turned 51 years old. She was in the 160's lbs before she had children. She has tried weight watchers, Adipex, Qsymia ( raised her bp ), Atkins diet. She is currently eating lean meat, increasing vegetables and juicing and counting calories. She was seen 09/2015 weight is 262 lbs, we gave her Bernie Covey and it worked for her, but insurance denied coverage, she took Victoza but causes nausea at times.  She is seeing Dr. Daphine Deutscher and wants to have bariatric surgery, seeing dietician next week.   Metabolic Syndrome: she was doing well on Victoza, no polyphagia, polydipsia or polyuria on medication, last hgbA1C was 5.9% in Feb 2017, we will recheck labs today   Elevated C-reactive protein and lupus anticoagulant: we checked last year because of recurrent episcleritis, never saw rheumatologist, but states no episodes of episcleritis in the past year. Discussed rechecking level and she is willing to have it done   Patient Active Problem List   Diagnosis Date Noted  . Hyperlipidemia 06/25/2016  . Hypertensive emergency 06/24/2016  . Chest pain 06/24/2016  . Metabolic syndrome 11/19/2015  . Lupus anticoagulant positive 11/19/2015  . Elevated C-reactive protein 07/08/2015  . Perimenopause 05/28/2015  . Morbid obesity (HCC) 03/11/2015  . Gastro-esophageal reflux disease without  esophagitis 03/11/2015  . Anxiety 03/11/2015  . Insomnia, persistent 03/11/2015  . Osteoarthritis of both knees 03/11/2015  . Episcleritis 03/11/2015  . History of fracture of tibia 05/23/2014  . History of sprain of ankle 05/23/2014  . S/P right knee arthroscopy 12/28/2011  . Medial meniscus, posterior horn derangement 12/01/2011  . Mononeuritis leg 07/17/2011  . Night muscle spasms 07/17/2011    Past Surgical History:  Procedure Laterality Date  . CESAREAN SECTION  1988  . CHONDROPLASTY  12/24/2011   Procedure: CHONDROPLASTY;  Surgeon: Vickki Hearing, MD;  Location: [facility];  Service: Orthopedics;  Laterality: Right;  . DILATION AND CURETTAGE OF UTERUS     "more than 1"  . FRACTURE SURGERY    . INTRAUTERINE DEVICE INSERTION  ~ 2016  . TIBIA IM NAIL INSERTION Right 05/23/2014   Procedure: INTRAMEDULLARY (IM) NAIL RIGHT TIBIA;  Surgeon: Harvie Junior, MD;  Location: [facility];  Service: Orthopedics;  Laterality: Right;    Family History  Problem Relation Age of Onset  . Hypertension Mother   . Hypertension Father   . Diabetes Father   . Stroke Father   . Insomnia Sister   . Hypertension Sister   . Diabetes Sister   . Heart failure Maternal Grandfather   . Diabetes Paternal Grandfather   . Heart attack Paternal Grandfather     Social History   Social History  . Marital status: Married    Spouse name: N/A  .  Number of children: N/A  . Years of education: N/A   Occupational History  . Not on file.   Social History Main Topics  . Smoking status: Never Smoker  . Smokeless tobacco: Never Used  . Alcohol use 2.4 oz/week    2 Cans of beer, 2 Shots of liquor per week  . Drug use: No  . Sexual activity: Yes   Other Topics Concern  . Not on file   Social History Narrative   Married, working two jobs ( taking one extra shift to help pay for daughter' college)        Current Outpatient Prescriptions:  .  ALPRAZolam (XANAX) 0.5 MG tablet, TAKE HALF TO ONE TABLET BY  MOUTH EVERY 4 HOURS AS NEEDED FOR ANXIETY, Disp: 30 tablet, Rfl: 0 .  aspirin EC 81 MG tablet, Take 1 tablet (81 mg total) by mouth daily., Disp: 30 tablet, Rfl: 0 .  cyclobenzaprine (FLEXERIL) 10 MG tablet, Take 1 tablet (10 mg total) by mouth at bedtime., Disp: 90 tablet, Rfl: 0 .  diclofenac (VOLTAREN) 50 MG EC tablet, Take 50 mg by mouth 2 (two) times daily., Disp: , Rfl:  .  diphenhydrAMINE (BENADRYL) 25 MG tablet, Take 25 mg by mouth every 6 (six) hours as needed. Reported on 05/28/2015, Disp: , Rfl:  .  esomeprazole (NEXIUM) 40 MG capsule, Take 1 capsule (40 mg total) by mouth daily at 12 noon. Reported on 05/28/2015, Disp: 30 capsule, Rfl: 5 .  hydrochlorothiazide (HYDRODIURIL) 25 MG tablet, Take 1 tablet (25 mg total) by mouth daily., Disp: 90 tablet, Rfl: 1 .  nebivolol (BYSTOLIC) 10 MG tablet, Take 1 tablet (10 mg total) by mouth daily., Disp: 90 tablet, Rfl: 1 .  NOVOFINE 32G X 6 MM MISC, USE AS DIRECTED, Disp: 100 each, Rfl: 0 .  zolpidem (AMBIEN) 10 MG tablet, Take 0.5-1 tablets (5-10 mg total) by mouth at bedtime., Disp: 90 tablet, Rfl: 0  Allergies  Allergen Reactions  . Adhesive [Tape] Itching  . Latex Itching  . Meloxicam     Elevated BP  . Gabapentin Rash  . Morphine And Related Rash     ROS  Constitutional: Negative for fever or weight change.  Respiratory: Negative for cough and shortness of breath.   Cardiovascular: Negative for chest pain or palpitations.  Gastrointestinal: Negative for abdominal pain, no bowel changes.  Musculoskeletal: Positive for intermittent  gait problem and  joint swelling.  Skin: Negative for rash.  Neurological: Negative for dizziness or headache.  No other specific complaints in a complete review of systems (except as listed in HPI above).  Objective  Vitals:   10/15/16 0927  BP: 110/68  Pulse: 66  Resp: 16  Temp: 98.1 F (36.7 C)  SpO2: 96%  Height: 5\' 6"  (1.676 m)    There is no height or weight on file to calculate  BMI.  Physical Exam  Constitutional: Patient appears well-developed and well-nourished. Obese  No distress.  HEENT: head atraumatic, normocephalic, pupils equal and reactive to light,  neck supple, throat within normal limits Cardiovascular: Normal rate, regular rhythm and normal heart sounds.  No murmur heard. No BLE edema. Pulmonary/Chest: Effort normal and breath sounds normal. No respiratory distress. Abdominal: Soft.  There is no tenderness. Psychiatric: Patient has a normal mood and affect. behavior is normal. Judgment and thought content normal.  Recent Results (from the past 2160 hour(s))  Myocardial Perfusion Imaging     Status: None   Collection Time: 08/12/16  1:39 PM  Result Value Ref Range   Rest HR 67 bpm   Rest BP 113/69 mmHg   RPE 17    Exercise duration (sec) 30 sec   Percent HR 88 %   Exercise duration (min) 7 min   Estimated workload 8.4 METS   Peak HR 155 bpm   Peak BP 148/78 mmHg   MPHR 170 bpm   SSS 2    SRS 0    SDS 2    TID 0.82    LV sys vol 27 mL   LV dias vol 87 46 - 106 mL  Cytology - PAP     Status: None   Collection Time: 10/08/16 12:00 AM  Result Value Ref Range   CYTOLOGY - PAP PAP RESULT       PHQ2/9: [facility] 07/07/2016 10/06/2015 07/09/2015 05/28/2015 03/11/2015  Decreased Interest 0 0 0 0 0  Down, Depressed, Hopeless 0 0 0 0 0  PHQ - 2 Score 0 0 0 0 0     Fall Risk: Fall Risk  07/07/2016 10/06/2015 07/09/2015 05/28/2015 03/11/2015  Falls in the past year? No No No No Yes  Number falls in past yr: - - - - 1  Injury with Fall? - - - - Yes     Assessment & Plan  1. Hypertension, benign  Towards low end of normal, advised to take half pill of HCTZ and monitor   2. Morbid obesity, unspecified obesity type [facility](HCC)  She is going to Dr. Daphine DeutscherMartin and planning [facility]. Her maximum weight was 264 lbs . She has tried multiple diets and weight loss medications, she has co-morbidities and  is a good candidate for surgery.   3. GAD (generalized anxiety disorder)  - ALPRAZolam (XANAX) 0.5 MG tablet; TAKE HALF TO ONE TABLET BY MOUTH EVERY 4 HOURS AS NEEDED FOR ANXIETY  Dispense: 30 tablet; Refill: 0 Taking medication prn.   4. Primary insomnia  She is taking half pills at times, she is award of FDA guidlines - zolpidem (AMBIEN) 10 MG tablet; Take 0.5-1 tablets (5-10 mg total) by mouth at bedtime.  Dispense: 90 tablet; Refill: 0  5. Metabolic syndrome  She wants to have labs done today  6. Elevated C-reactive protein  She never went to Rheumatologist, we will recheck labs - Sedimentation rate - C-reactive protein - Lupus anticoagulant panel

## 2016-10-15 NOTE — Addendum Note (Signed)
Addended by: Cynda FamiliaJOHNSON, Aileena Iglesia L on: 10/15/2016 10:18 AM   Modules accepted: Orders

## 2016-10-16 LAB — HEMOGLOBIN A1C
Hgb A1c MFr Bld: 5.5 % (ref <5.7)
Mean Plasma Glucose: 111 mg/dL

## 2016-10-16 LAB — VITAMIN D 25 HYDROXY (VIT D DEFICIENCY, FRACTURES): Vit D, 25-Hydroxy: 20 ng/mL — ABNORMAL LOW (ref 30–100)

## 2016-10-16 LAB — SEDIMENTATION RATE: Sed Rate: 38 mm/hr — ABNORMAL HIGH (ref 0–30)

## 2016-10-18 LAB — RFX PTT-LA W/RFX TO HEX PHASE CONF: PTT-LA Screen: 62 s — ABNORMAL HIGH (ref <=40)

## 2016-10-18 LAB — RFX DRVVT SCR W/RFLX CONF 1:1 MIX: dRVVT Screen: 45 s (ref <=45)

## 2016-10-18 LAB — THROMBIN CLOTTING TIME: Thrombin Clotting Time: 15 s (ref 13–19)

## 2016-10-18 LAB — INSULIN, FASTING: Insulin fasting, serum: 11 u[IU]/mL (ref 2.0–19.6)

## 2016-10-18 LAB — RFLX HEXAGONAL PHASE CONFIRM

## 2016-10-18 LAB — LUPUS ANTICOAGULANT PANEL

## 2016-10-19 ENCOUNTER — Other Ambulatory Visit: Payer: Self-pay | Admitting: Family Medicine

## 2016-10-19 DIAGNOSIS — R760 Raised antibody titer: Secondary | ICD-10-CM

## 2016-10-20 ENCOUNTER — Other Ambulatory Visit: Payer: Self-pay

## 2016-10-20 DIAGNOSIS — R760 Raised antibody titer: Secondary | ICD-10-CM

## 2016-10-20 LAB — C-REACTIVE PROTEIN: CRP: 21.6 mg/L — ABNORMAL HIGH (ref <8.0)

## 2016-10-25 ENCOUNTER — Telehealth: Payer: Self-pay | Admitting: Cardiovascular Disease

## 2016-10-29 ENCOUNTER — Ambulatory Visit: Payer: 59 | Admitting: Registered"

## 2016-10-29 ENCOUNTER — Telehealth: Payer: Self-pay | Admitting: Cardiovascular Disease

## 2016-10-29 NOTE — Telephone Encounter (Signed)
Closed encounter °

## 2016-11-04 ENCOUNTER — Ambulatory Visit: Payer: 59 | Admitting: Cardiovascular Disease

## 2016-11-04 NOTE — Telephone Encounter (Signed)
c 

## 2016-11-08 ENCOUNTER — Encounter: Payer: Self-pay | Admitting: Skilled Nursing Facility1

## 2016-11-08 ENCOUNTER — Encounter: Payer: 59 | Attending: Surgery | Admitting: Skilled Nursing Facility1

## 2016-11-08 DIAGNOSIS — K449 Diaphragmatic hernia without obstruction or gangrene: Secondary | ICD-10-CM | POA: Diagnosis not present

## 2016-11-08 DIAGNOSIS — I10 Essential (primary) hypertension: Secondary | ICD-10-CM | POA: Insufficient documentation

## 2016-11-08 DIAGNOSIS — K219 Gastro-esophageal reflux disease without esophagitis: Secondary | ICD-10-CM | POA: Insufficient documentation

## 2016-11-08 DIAGNOSIS — Z6841 Body Mass Index (BMI) 40.0 and over, adult: Secondary | ICD-10-CM | POA: Diagnosis not present

## 2016-11-08 DIAGNOSIS — E785 Hyperlipidemia, unspecified: Secondary | ICD-10-CM | POA: Diagnosis not present

## 2016-11-08 DIAGNOSIS — E669 Obesity, unspecified: Secondary | ICD-10-CM

## 2016-11-08 NOTE — Progress Notes (Signed)
Pre-Op Assessment Visit:  Pre-Operative Sleeve Gastrectomy Surgery  Medical Nutrition Therapy:  Appt start time: 7:24  End time:  8:35  Patient was seen on 11/08/2016 for Pre-Operative Nutrition Assessment. Assessment [facility] on 11/08/2016.   Pt states before surgery she wants to lose 20-25 pounds. Pt states she is lactose intolerant. Pt states she trys to stay away from coffee. Pt states she may have lupus. Pt states if she gets really hot and has eaten she will need to have a bowel movement. Pt states her long term goal is to run a 5k. 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   Pt expectation of surgery: to lose weight, to remove some receptor sites that indicate hunger, to feel better  Pt expectation of Dietitian: to renew my knowledge of the servings I have to eat, to help me make better choices   Start weight at NDES: 270.5 BMI: 46.24  24 hr Dietary Recall: First Meal: fast food---skipped----english muffin with peanut butter and an egg Snack:  Second Meal: skipped----fast food---tuna out of the can Snack:  Third Meal: steak, steamed veggies, candied yams Snack:  Beverages: soda, unsweet tea, half and half tea, water  Encouraged to engage in 150 minutes of moderate physical activity including cardiovascular and weight baring weekly  Handouts given during visit include:  . Pre-Op Goals . Bariatric Surgery Protein Shakes During the appointment today the following Pre-Op Goals were reviewed with the patient: . Maintain or lose weight as instructed by your surgeon . Make healthy food choices . Begin to limit portion sizes . Limited concentrated sugars and fried foods . Keep fat/sugar in the single digits per serving on             food labels . Practice CHEWING your food  (aim for 30 chews per bite or until applesauce consistency) . Practice not  drinking 15 minutes before, during, and 30 minutes after each meal/snack . Avoid all carbonated beverages  . Avoid/limit caffeinated beverages  . Avoid all sugar-sweetened beverages . Consume 3 meals per day; eat every 3-5 hours . Make a list of non-food related activities . Aim for 64-100 ounces of FLUID daily  . Aim for at least 60-80 grams of PROTEIN daily . Look for a liquid protein source that contain ?15 g protein and ?5 g carbohydrate  (ex: shakes, drinks, shots)  -Follow diet recommendations listed below   Energy and Macronutrient Recomendations: Calories: 1500 Carbohydrate: 170 Protein: 112 Fat: 42  Demonstrated degree of understanding via:  Teach Back  Teaching Method Utilized:  Visual Auditory Hands on  Barriers to learning/adherence to lifestyle change: none identified   Patient to call the Nutrition and Diabetes Education Services to enroll in Pre-Op and Post-Op Nutrition Education when surgery date is scheduled.

## 2016-11-16 ENCOUNTER — Ambulatory Visit: Payer: 59 | Admitting: Psychiatry

## 2016-11-23 ENCOUNTER — Other Ambulatory Visit: Payer: Self-pay

## 2016-11-23 ENCOUNTER — Other Ambulatory Visit (HOSPITAL_COMMUNITY): Payer: Self-pay | Admitting: Surgery

## 2016-11-23 ENCOUNTER — Ambulatory Visit (HOSPITAL_COMMUNITY)
Admission: RE | Admit: 2016-11-23 | Discharge: 2016-11-23 | Disposition: A | Discharge status: Home / Self Care | Payer: 59 | Source: Ambulatory Visit | Attending: Surgery | Admitting: Surgery

## 2016-11-23 ENCOUNTER — Ambulatory Visit (HOSPITAL_COMMUNITY): Payer: 59

## 2016-11-23 DIAGNOSIS — Z01818 Encounter for other preprocedural examination: Secondary | ICD-10-CM | POA: Insufficient documentation

## 2016-11-23 DIAGNOSIS — I10 Essential (primary) hypertension: Secondary | ICD-10-CM | POA: Diagnosis not present

## 2016-11-23 DIAGNOSIS — E669 Obesity, unspecified: Secondary | ICD-10-CM

## 2016-11-26 ENCOUNTER — Ambulatory Visit: Payer: 59 | Admitting: Cardiovascular Disease

## 2016-11-26 NOTE — Progress Notes (Deleted)
Cardiology Office Note   Date:  11/26/2016   ID:  Sharon Owen, DOB 09/04/1965, MRN [id number]  PCP:  Alba CorySowles, Krichna, MD  Cardiologist:   Sharon Siiffany Arabi, MD   No chief complaint on file.     History of Present Illness: Sharon Owen is a 51 y.o. female with hypertension, morbid obesity and hyperlipidemia who presents for follow up.  She was initially seen 06/2016 for management of hypertension and chest pain.  Sharon Owen was seen in the ED 06/25/16 with chest pain in the setting of being off her antihypertension medications and under a lot of stress.  Cardiac enzymes were negative and her EKG was negative for ischemia.  Hydrochlorothiazide was restarted and she followed up with Sharon Owen on 3/14.  She was referred for an exercise Myoview 08/12/16 that revealed LVEF 69% and no ischemia.  She achieved 8.4 METS.  She had an echo 07/2015 that revealed LVEF 60-65% with mild MR and was otherwise normal.  Her lipids were elevated and her ASCVD 10 year risk was 9.1%.  She is working on diet and exercise and doesn't want to start a statin.  At her last appointment Sharon Owen reported fatigue so metoprolol was switched to nebivolol.  ?gastric sleeve   Past Medical History:  Diagnosis Date  . Anxiety   . Chronic osteoarthritis    "knees" (06/24/2016)  . Complication of anesthesia    "I'm hard to wake up" (06/24/2016)  . Elevated blood pressure   . Episclerotitis, right    "of eye"  . GERD (gastroesophageal reflux disease)   . Headache    "monthly at least" (06/24/2016)  . Hypertension   . Insomnia   . Metabolic syndrome   . Morbid obesity (HCC)     Past Surgical History:  Procedure Laterality Date  . CESAREAN SECTION  1988  . CHONDROPLASTY  12/24/2011   Procedure: CHONDROPLASTY;  Surgeon: Vickki HearingStanley E Harrison, MD;  Location: [facility];  Service: Orthopedics;  Laterality: Right;  . DILATION AND CURETTAGE OF UTERUS     "more than 1"  . FRACTURE SURGERY    . INTRAUTERINE DEVICE  INSERTION  ~ 2016  . TIBIA IM NAIL INSERTION Right 05/23/2014   Procedure: INTRAMEDULLARY (IM) NAIL RIGHT TIBIA;  Surgeon: Harvie JuniorJohn L Graves, MD;  Location: [facility];  Service: Orthopedics;  Laterality: Right;     Current Outpatient Prescriptions  Medication Sig Dispense Refill  . ALPRAZolam (XANAX) 0.5 MG tablet TAKE HALF TO ONE TABLET BY MOUTH EVERY 4 HOURS AS NEEDED FOR ANXIETY 30 tablet 0  . aspirin EC 81 MG tablet Take 1 tablet (81 mg total) by mouth daily. 30 tablet 0  . cyclobenzaprine (FLEXERIL) 10 MG tablet Take 1 tablet (10 mg total) by mouth at bedtime. 90 tablet 0  . diclofenac (VOLTAREN) 50 MG EC tablet Take 50 mg by mouth 2 (two) times daily.    . diphenhydrAMINE (BENADRYL) 25 MG tablet Take 25 mg by mouth every 6 (six) hours as needed. Reported on 05/28/2015    . esomeprazole (NEXIUM) 40 MG capsule Take 1 capsule (40 mg total) by mouth daily at 12 noon. Reported on 05/28/2015 30 capsule 5  . hydrochlorothiazide (HYDRODIURIL) 25 MG tablet Take 1 tablet (25 mg total) by mouth daily. 90 tablet 1  . nebivolol (BYSTOLIC) 10 MG tablet Take 1 tablet (10 mg total) by mouth daily. 90 tablet 1  . NOVOFINE 32G X 6 MM MISC USE AS DIRECTED 100 each 0  .  zolpidem (AMBIEN) 10 MG tablet Take 0.5-1 tablets (5-10 mg total) by mouth at bedtime. 90 tablet 0   No current facility-administered medications for this visit.     Allergies:   Adhesive [tape]; Latex; Meloxicam; Gabapentin; and Morphine and related    Social History:  The patient  reports that she has never smoked. She has never used smokeless tobacco. She reports that she drinks about 2.4 oz of alcohol per week . She reports that she does not use drugs.   Family History:  The patient's family history includes Diabetes in her father, paternal grandfather, and sister; Heart attack in her paternal grandfather; Heart failure in her maternal grandfather; Hypertension in her father, mother, and sister; Insomnia in her sister; Stroke in her father.     ROS:  Please see the history of present illness.   Otherwise, review of systems are positive for none.   All other systems are reviewed and negative.    PHYSICAL EXAM: VS:  There were no vitals taken for this visit. , BMI There is no height or weight on file to calculate BMI. GENERAL:  Well appearing.  No acute distress.  Morbidly obese HEENT:  Pupils equal round and reactive, fundi not visualized, oral mucosa unremarkable NECK:  No jugular venous distention, waveform within normal limits, carotid upstroke brisk and symmetric, no bruits LUNGS:  Clear to auscultation bilaterally.  No crackles, wheezes or rhonchi HEART:  RRR.  PMI not displaced or sustained,S1 and S2 within normal limits, no S3, no S4, no clicks, no rubs, no murmurs ABD:  Flat, positive bowel sounds normal in frequency in pitch, no bruits, no rebound, no guarding, no midline pulsatile mass, no hepatomegaly, no splenomegaly EXT:  2 plus pulses throughout, no edema, no cyanosis no clubbing SKIN:  No rashes no nodules NEURO:  Cranial nerves II through XII grossly intact, motor grossly intact throughout PSYCH:  Cognitively intact, oriented to person place and time   EKG:  EKG is not ordered today.  Exercise Myoveiw 08/12/16:  The left ventricular ejection fraction is hyperdynamic (>65%).  Nuclear stress EF: 69%.  Blood pressure demonstrated a normal response to exercise.  There was 1mm of J point depression with upsloping ST segement depression at peak exercise  The perfusion study is normal with no ischemia.  This is a low risk study.     Recent Labs: 06/24/2016: ALT 14; BUN 16; Creatinine, Ser 0.76; Hemoglobin 13.3; Platelets 299; Potassium 3.7; Sodium 139 06/25/2016: TSH 2.667    Lipid Panel    Component Value Date/Time   CHOL 202 (H) 06/25/2016 0826   CHOL 192 05/28/2015 0944   TRIG 121 06/25/2016 0826   HDL 46 06/25/2016 0826   HDL 49 05/28/2015 0944   CHOLHDL 4.4 06/25/2016 0826   VLDL 24 06/25/2016  0826   LDLCALC 132 (H) 06/25/2016 0826   LDLCALC 127 (H) 05/28/2015 0944      Wt Readings from Last 3 Encounters:  11/08/16 122.7 kg (270 lb 8 oz)  10/15/16 120.7 kg (266 lb 1.6 oz)  10/08/16 121.9 kg (268 lb 12.8 oz)      ASSESSMENT AND PLAN:  # Atypical chest pain:  Exercise Myoview was negative for ischemia.  Continue aspirin.  # Hypertension: Blood pressure is better-controlled. She has fatigue with metoprolol.  We will switch this to nebivolol.  Continue HCTZ.  # Palpitations: Better-controlled on metoprolol but she has fatigue so switching to nebivolol as above.  # Hyperlipidemia: ASCVD 10 year risk 9%.  Continue aspirin 81 mg daily.  She wants to avoid statins.  She is considering gastric sleeve procedure.   She is at acceptable risk for surgery.   Current medicines are reviewed at length with the patient today.  The patient does not have concerns regarding medicines.  The following changes have been made:  no change  Labs/ tests ordered today include:   No orders of the defined types were placed in this encounter.    Disposition:   FU with Jullisa Grigoryan C. Duke Salviaandolph, MD, [facility] in 1 month   This note was written with the assistance of speech recognition software.  Please excuse any transcriptional errors.  Signed, Myles Tavella C. Duke Salviaandolph, MD, [facility]  11/26/2016 8:07 AM    [facility]

## 2016-12-07 ENCOUNTER — Encounter: Payer: 59 | Attending: Surgery | Admitting: Skilled Nursing Facility1

## 2016-12-07 ENCOUNTER — Encounter: Payer: Self-pay | Admitting: Skilled Nursing Facility1

## 2016-12-07 DIAGNOSIS — I10 Essential (primary) hypertension: Secondary | ICD-10-CM | POA: Diagnosis not present

## 2016-12-07 DIAGNOSIS — K219 Gastro-esophageal reflux disease without esophagitis: Secondary | ICD-10-CM | POA: Insufficient documentation

## 2016-12-07 DIAGNOSIS — E785 Hyperlipidemia, unspecified: Secondary | ICD-10-CM | POA: Diagnosis not present

## 2016-12-07 DIAGNOSIS — Z6841 Body Mass Index (BMI) 40.0 and over, adult: Secondary | ICD-10-CM | POA: Diagnosis not present

## 2016-12-07 DIAGNOSIS — E669 Obesity, unspecified: Secondary | ICD-10-CM

## 2016-12-07 DIAGNOSIS — K449 Diaphragmatic hernia without obstruction or gangrene: Secondary | ICD-10-CM | POA: Insufficient documentation

## 2016-12-07 NOTE — Progress Notes (Signed)
  Assessment:   1st  SWL Appointment. Sleeve Gastrectomy   Pt arrived complaining she did not get a set menu the last appointment so she has not worked on anything. Pt states she has not been working on anything because she was not given a meal plan and her friend got a meal plan so she was expecting to have gotten one. Pt states she wants to work on not eating after 6pm so she is going to do a meal replacement for dinner. Pt continud to verbalize her disappointment about not getting a meal plan developed. Dietitian explained the process of the supervised weight loss months and the current research supporting the monthly development as well as there not being enough time in a 15 minute time period to develop an entire meal plan to her preferences. Pt states she stopped eating fast food in the morning. Pt states she has a busy schedule so she does not have time to eat and plan meals. Pt states she was observed by her doctor so she does not think she needs anymore SWL classes.   Start Wt at NDES: 270.8 Wt: 276.3.2 BMI: 47.22  MEDICATIONS: See List   DIETARY INTAKE:  24-hr recall:  B ( AM): cereal---eggs with cheese and sausage and toast Snk ( AM):  L ( PM): sandwich chips and fruit---fast food Snk ( PM):  D ( PM): salmon cakes cooked in olive oil and salad with blue cheese dressing ---steak and salad---spagetti with ground [place] Snk ( PM):  Beverages: water, sparkling water  Usual physical activity: ADL's  Diet to Follow: 1600 calories 180 g carbohydrates 120 g protein 44 g fat   Nutritional Diagnosis:  [place]-3.3 Overweight/obesity related to past poor dietary habits and physical inactivity as evidenced by patient w/ planned sleeve  surgery following dietary guidelines for continued weight loss.    Intervention:  Nutrition counseling for upcoming Bariatric Surgery. Goals: -Work on finding time in your day to dedicate to determining how you are going to implement a healthy lifestyle   incorporate 150 minutes of physical activity including cardio and weight bearing exercises  Teaching Method Utilized:  Visual Auditory Hands on  Barriers to learning/adherence to lifestyle change: in the pre contemplative state of change  Demonstrated degree of understanding via:  Teach Back   Monitoring/Evaluation:  Dietary intake, exercise, and body weight prn.

## 2017-01-10 ENCOUNTER — Encounter: Payer: Self-pay | Admitting: Registered"

## 2017-01-10 ENCOUNTER — Encounter: Payer: 59 | Attending: Surgery | Admitting: Registered"

## 2017-01-10 DIAGNOSIS — K449 Diaphragmatic hernia without obstruction or gangrene: Secondary | ICD-10-CM | POA: Insufficient documentation

## 2017-01-10 DIAGNOSIS — I10 Essential (primary) hypertension: Secondary | ICD-10-CM | POA: Insufficient documentation

## 2017-01-10 DIAGNOSIS — Z6841 Body Mass Index (BMI) 40.0 and over, adult: Secondary | ICD-10-CM | POA: Insufficient documentation

## 2017-01-10 DIAGNOSIS — K219 Gastro-esophageal reflux disease without esophagitis: Secondary | ICD-10-CM | POA: Insufficient documentation

## 2017-01-10 DIAGNOSIS — E785 Hyperlipidemia, unspecified: Secondary | ICD-10-CM | POA: Insufficient documentation

## 2017-01-10 DIAGNOSIS — E669 Obesity, unspecified: Secondary | ICD-10-CM

## 2017-01-10 NOTE — Patient Instructions (Addendum)
-   Aim to consistently eat 3 meals a day.   - Have an afternoon snack while traveling to pick son up from daycare.   - Find a variety of protein shake/drink flavors that you enjoy with at least 15 grams or more and 5 grams or less of carbohydrates.

## 2017-01-10 NOTE — Progress Notes (Signed)
Appt. Start time: 2:12  End time: 2:35 Assessment: 2nd SWL Appointment. Sleeve Gastrectomy    Pt arrives lost 2.6 lbs from previous visit. Pt is very happy and about her weight loss, although she did not want to know the amount she lost. Pt states she has been working hard on behavioral changes such as chewing to applesauce consistency. Pt states she has also increased walking to 3 mi, 4 days/week. Pt states her sister had sleeve gastrectomy and is doing well. Pt states she skips breakfast sometimes and sometimes doesn't eat dinner because she does not like eating around 7pm after picking son up from daycare and getting him settled at home.   Pt states she was observed by her doctor so she does not think she needs anymore SWL classes.    Start Wt at NDES: 270.8 Wt: 273.7 BMI: 46.79  MEDICATIONS: See List   DIETARY INTAKE:  24-hr recall:  B ( AM): sometimes skips; cereal and greek yogurt ---eggs with cheese and sausage and toast Snk ( AM): protein bar L ( PM): sandwich chips and fruit---fast food or burger w/o fries Snk ( PM): sometimes nuts D ( PM): salmon cakes cooked in olive oil and salad with blue cheese dressing ---steak and salad---spagetti with ground [place] Snk ( PM):  Beverages: water, sparkling water, 1/2 c coffee  Usual physical activity: walking 3 mi, 4 days/week ADL's  Diet to Follow: 1600 calories 180 g carbohydrates 120 g protein 44 g fat   Nutritional Diagnosis:  [place]-3.3 Overweight/obesity related to past poor dietary habits and physical inactivity as evidenced by patient w/ planned sleeve  surgery following dietary guidelines for continued weight loss.    Intervention:  Nutrition counseling for upcoming Bariatric Surgery. Goals: - Aim to consistently eat 3 meals a day.  - Have an afternoon snack while traveling to pick son up from daycare.  - Find a variety of protein shake/drink flavors that you enjoy with at least 15 grams or more and 5 grams or less of  carbohydrates.  - Incorporate 150 minutes of physical activity including cardio and weight bearing exercises   Handouts given: - Protein Shakes  Teaching Method Utilized:  Visual Auditory Hands on  Barriers to learning/adherence to lifestyle change: in the pre contemplative state of change  Demonstrated degree of understanding via:  Teach Back   Monitoring/Evaluation:  Dietary intake, exercise, and body weight in 1 month(s).

## 2017-01-14 ENCOUNTER — Ambulatory Visit: Payer: 59 | Admitting: Family Medicine

## 2017-01-19 ENCOUNTER — Ambulatory Visit: Payer: 59 | Admitting: Family Medicine

## 2017-02-07 ENCOUNTER — Encounter: Payer: 59 | Attending: Surgery | Admitting: Skilled Nursing Facility1

## 2017-02-07 DIAGNOSIS — I10 Essential (primary) hypertension: Secondary | ICD-10-CM | POA: Insufficient documentation

## 2017-02-07 DIAGNOSIS — K219 Gastro-esophageal reflux disease without esophagitis: Secondary | ICD-10-CM | POA: Diagnosis not present

## 2017-02-07 DIAGNOSIS — K449 Diaphragmatic hernia without obstruction or gangrene: Secondary | ICD-10-CM | POA: Insufficient documentation

## 2017-02-07 DIAGNOSIS — Z6841 Body Mass Index (BMI) 40.0 and over, adult: Secondary | ICD-10-CM | POA: Diagnosis not present

## 2017-02-07 DIAGNOSIS — E785 Hyperlipidemia, unspecified: Secondary | ICD-10-CM | POA: Diagnosis not present

## 2017-02-08 ENCOUNTER — Encounter: Payer: Self-pay | Admitting: Skilled Nursing Facility1

## 2017-02-08 ENCOUNTER — Ambulatory Visit: Payer: Self-pay | Admitting: Registered"

## 2017-02-08 NOTE — Progress Notes (Signed)
Pre-Operative Nutrition Class:  Appt start time: 6160   End time:  1830.  Patient was seen on 02/07/2017 for Pre-Operative Bariatric Surgery Education at the [facility].   Surgery date:  Surgery type: sleeve gastrectomy Start weight at [facility]: 270.8 Weight today: 273.8  Samples given per MNT protocol. Patient educated on appropriate usage: Bariatric Advantage Multivitamin Lot # [id number] Exp: 07/19  Bariatric Advantage Calcium  Lot # [id number] Exp: nov-05-2016  Renee Pain Protein  Shake Lot # [id number] Exp: Sep 20, 2017 The following the learning objectives were met by the patient during this course:  Identify Pre-Op Dietary Goals and will begin 2 weeks pre-operatively  Identify appropriate sources of fluids and proteins   State protein recommendations and appropriate sources pre and post-operatively  Identify Post-Operative Dietary Goals and will follow for 2 weeks post-operatively  Identify appropriate multivitamin and calcium sources  Describe the need for physical activity post-operatively and will follow MD recommendations  State when to call healthcare provider regarding medication questions or post-operative complications  Handouts given during class include:  Pre-Op Bariatric Surgery Diet Handout  Protein Shake Handout  Post-Op Bariatric Surgery Nutrition Handout  BELT Program Information Flyer  Support Group Information Flyer  [facility] Bariatric Supplements Price List  Follow-Up Plan: [facility] diet advancement per MD.

## 2017-02-14 ENCOUNTER — Encounter: Payer: 59 | Admitting: Registered"

## 2017-02-14 ENCOUNTER — Encounter: Payer: Self-pay | Admitting: Registered"

## 2017-02-14 DIAGNOSIS — E669 Obesity, unspecified: Secondary | ICD-10-CM

## 2017-02-14 NOTE — Progress Notes (Signed)
Appt. Start time: 4:22   End time: 4:45 Assessment: 3rd SWL Appointment. Sleeve Gastrectomy   Start Wt at [facility]: 270.8 Wt: 272.7 BMI: 46.81  Pt arrives maintained from previous visit. Pt states she has been eating 3 meals a day. Pt states she is drinking 3-4 bottles of water a day; also helps with legs cramps she experiences at night. Pt states she has been very frustrated with lack of communication as her surgery date approaches. Pt reports surgery is scheduled for 11/5. Pt states she has started pre-op diet and it is going well. Pt states she drinks a protein shake for dinner on busy days because she does not like to eat dinner late, at 8pm.   Pt is very happy and about her weight loss, although she did not want to know the amount she lost. Pt states she has been working hard on behavioral changes such as chewing to applesauce consistency. Pt states she has also increased walking to 3 mi, 4 days/week. Pt states her sister had sleeve gastrectomy and is doing well. Pt states she skips breakfast sometimes and sometimes doesn't eat dinner because she does not like eating around 7pm after picking son up from daycare and getting him settled at home.     MEDICATIONS: See List   DIETARY INTAKE:  24-hr recall:  B ( AM): cereal and greek yogurt ---eggs with cheese and sausage and toast Snk ( AM): protein bar L ( PM): sandwich chips and fruit---fast food or burger w/o fries Snk ( PM): sometimes nuts D ( PM): salmon cakes cooked in olive oil and salad with blue cheese dressing ---steak and salad---spagetti with ground [place] Snk ( PM):  Beverages: water, sparkling water, 1/2 c coffee  Usual physical activity: walking 60 min, 5 days/week   Diet to Follow: 1600 calories 180 g carbohydrates 120 g protein 44 g fat   Nutritional Diagnosis:  [place]-3.3 Overweight/obesity related to past poor dietary habits and physical inactivity as evidenced by patient w/ planned sleeve  surgery following dietary  guidelines for continued weight loss.    Intervention:  Nutrition counseling for upcoming Bariatric Surgery. Goals: - Meal prep on weekends for the upcoming week.  - Aim to eat dinner as family.  - Take multivitamin complete with iron and Vitamin D 5000IU.  - Continue to aim to not drink 15 minutes before eating, not while eating, and waiting 30 minutes after eating.   Handouts given: - none  Teaching Method Utilized:  Visual Auditory Hands on  Barriers to learning/adherence to lifestyle change: in the pre contemplative state of change  Demonstrated degree of understanding via:  Teach Back   Monitoring/Evaluation:  Dietary intake, exercise, and body weight prn.

## 2017-02-14 NOTE — Patient Instructions (Addendum)
-   Meal prep on weekends for the upcoming week.   - Aim to eat dinner as family.   - Take multivitamin complete with iron and Vitamin D 5000IU.   - Continue to aim to not drink 15 minutes before eating, not while eating, and waiting 30 minutes after eating.

## 2017-02-18 NOTE — Progress Notes (Signed)
Please place orders in EPIC as patient has a pre-op appointment on 02/23/2017! Thank you! 

## 2017-02-21 NOTE — Progress Notes (Signed)
10-26-16 (EPIC) EKG, CXR  08-12-16 (EPIC) Stress  08-08-15 (EPIC) ECHO

## 2017-02-21 NOTE — Progress Notes (Signed)
Please place orders in EPIC as patient has a pre-op appointment on 02/23/2017! Thank you! 

## 2017-02-21 NOTE — Patient Instructions (Addendum)
Vic BlackbirdCassandra F Nuttall  02/21/2017   Your procedure is scheduled on: 02-28-17    [facility]  Entrance [place] Elevators to 3rd floor to  Short Stay Center at 5:30 AM.   Call this number if you have problems the morning of surgery [phone]    Remember: ONLY 1 PERSON MAY GO WITH YOU TO SHORT STAY TO GET  READY MORNING OF YOUR SURGERY.  Do not eat food or drink liquids :After Midnight.     Take these medicines the morning of surgery with A SIP OF WATER: Esomeprazole (Nexium)                                You may not have any metal on your body including hair pins and              piercings  Do not wear jewelry, make-up, lotions, powders or perfumes, deodorant             Do not wear nail polish.  Do not shave  48 hours prior to surgery.                Do not bring valuables to the hospital. [place] IS NOT             RESPONSIBLE   FOR VALUABLES.  Contacts, dentures or bridgework may not be worn into surgery.  Leave suitcase in the car. After surgery it may be brought to your room.              Please read over the following fact sheets you were given: _____________________________________________________________________             [facility] - Preparing [facility] Before surgery, you can play an important role.  Because skin is not sterile, your skin needs to be as free of germs as possible.  You can reduce the number of germs on your skin by washing with CHG (chlorahexidine gluconate) soap before surgery.  CHG is an antiseptic cleaner which kills germs and bonds with the skin to continue killing germs even after washing. Please DO NOT use if you have an allergy to CHG or antibacterial soaps.  If your skin becomes reddened/irritated stop using the CHG and inform your nurse when you arrive at Short Stay. Do not shave (including legs and underarms) for at least 48 hours prior to the first CHG shower.  You may shave your face/neck. Please  follow these instructions carefully:  1.  Shower with CHG Soap the night before surgery and the  morning of Surgery.  2.  If you choose to wash your hair, wash your hair first as usual with your  normal  shampoo.  3.  After you shampoo, rinse your hair and body thoroughly to remove the  shampoo.                           4.  Use CHG as you would any other liquid soap.  You can apply chg directly  to the skin and wash                       Gently with a scrungie or clean washcloth.  5.  Apply the CHG Soap to your body ONLY FROM THE  NECK DOWN.   Do not use on face/ open                           Wound or open sores. Avoid contact with eyes, ears mouth and genitals (private parts).                       Wash face,  Genitals (private parts) with your normal soap.             6.  Wash thoroughly, paying special attention to the area where your surgery  will be performed.  7.  Thoroughly rinse your body with warm water from the neck down.  8.  DO NOT shower/wash with your normal soap after using and rinsing off  the CHG Soap.                9.  Pat yourself dry with a clean towel.            10.  Wear clean pajamas.            11.  Place clean sheets on your bed the night of your first shower and do not  sleep with pets. Day of Surgery : Do not apply any lotions/deodorants the morning of surgery.  Please wear clean clothes to the hospital/surgery center.  FAILURE TO FOLLOW THESE INSTRUCTIONS MAY RESULT IN THE CANCELLATION OF YOUR SURGERY PATIENT SIGNATURE_________________________________  NURSE SIGNATURE__________________________________  ________________________________________________________________________

## 2017-02-22 NOTE — Progress Notes (Signed)
Please place orders in EPIC as patient has a pre-op appointment on 10/31! Thank you!

## 2017-02-23 ENCOUNTER — Ambulatory Visit: Payer: Self-pay | Admitting: Surgery

## 2017-02-23 ENCOUNTER — Encounter (HOSPITAL_COMMUNITY)
Admission: RE | Admit: 2017-02-23 | Discharge: 2017-02-23 | Disposition: A | Discharge status: Home / Self Care | Payer: 59 | Source: Ambulatory Visit | Attending: Surgery | Admitting: Surgery

## 2017-02-23 ENCOUNTER — Encounter (HOSPITAL_COMMUNITY): Payer: Self-pay

## 2017-02-23 ENCOUNTER — Other Ambulatory Visit: Payer: Self-pay

## 2017-02-23 ENCOUNTER — Telehealth: Payer: Self-pay | Admitting: Family Medicine

## 2017-02-23 DIAGNOSIS — Z01812 Encounter for preprocedural laboratory examination: Secondary | ICD-10-CM | POA: Insufficient documentation

## 2017-02-23 DIAGNOSIS — Z6841 Body Mass Index (BMI) 40.0 and over, adult: Secondary | ICD-10-CM | POA: Insufficient documentation

## 2017-02-23 DIAGNOSIS — F5101 Primary insomnia: Secondary | ICD-10-CM

## 2017-02-23 LAB — BASIC METABOLIC PANEL
Anion gap: 7 (ref 5–15)
BUN: 15 mg/dL (ref 6–20)
CO2: 29 mmol/L (ref 22–32)
Calcium: 9 mg/dL (ref 8.9–10.3)
Chloride: 105 mmol/L (ref 101–111)
Creatinine, Ser: 0.83 mg/dL (ref 0.44–1.00)
GFR calc Af Amer: >60 mL/min (ref >60)
GFR calc non Af Amer: >60 mL/min (ref >60)
Glucose, Bld: 118 mg/dL — ABNORMAL HIGH (ref 65–99)
Potassium: 3.8 mmol/L (ref 3.5–5.1)
Sodium: 141 mmol/L (ref 135–145)

## 2017-02-23 LAB — HCG, SERUM, QUALITATIVE: Preg, Serum: NEGATIVE

## 2017-02-23 LAB — CBC
HCT: 41.5 % (ref 36.0–46.0)
Hemoglobin: 13.5 g/dL (ref 12.0–15.0)
MCH: 29.2 pg (ref 26.0–34.0)
MCHC: 32.5 g/dL (ref 30.0–36.0)
MCV: 89.8 fL (ref 78.0–100.0)
Platelets: 345 10*3/uL (ref 150–400)
RBC: 4.62 MIL/uL (ref 3.87–5.11)
RDW: 15.1 % (ref 11.5–15.5)
WBC: 8 10*3/uL (ref 4.0–10.5)

## 2017-02-23 NOTE — Telephone Encounter (Signed)
Copied from CRM [id number]. Topic: Inquiry >> Feb 23, 2017  3:54 PM Alexander BergeronBarksdale, Harvey B wrote: Reason for CRM: PT called to let Dr. Carlynn PurlSowles know that she is needing her Remus Lofflerambien refilled and wanted her to know that she was having surgery on Monday

## 2017-02-23 NOTE — Progress Notes (Signed)
At PAT appointment, pt reported that she was prescribed Victoza for weight loss.

## 2017-02-23 NOTE — H&P (Signed)
Sharon BlackbirdCassandra F Owen 09/24/2016 11:39 AM Location: [facility] Patient #: [id number] DOB: 02/19/1966 Married / Language: English / Race: White Female   History of Present Illness Sharon Owen(Sharon Owen B. Sharon DeutscherMartin MD; 09/24/2016 12:45 PM) The patient is a 51 year old female who presents for a bariatric surgery evaluation. She is following along behind her sister, Sharon Owen who had bariatric surgery. She has been through our seminar and is interested in a sleeve gastrectomy. We went over that operation in some detail including complications not limited to bleeding and leaks from the sleeve. We talked a lot about her health. She does have some issues with elevated lipids and she's had some hypertensive problems as well. Her weight is 263.4 and her BMI is is 43.8.  She has tried numerous diets with success for a while and then we had weight regain. She's not had any upper abdominal surgery only a C-section with her first pregnancy. She denies any history of DVT. She does have a hiatal hernia with GERD. We discussed repairing hiatal hernia at the time of her sleeve gastrectomy.  We'll move forward with certification for sleeve gastrectomy.   Past Surgical History Sharon Owen(Sharon Owen, CMA; 09/24/2016 11:40 AM) Breast Biopsy  Left. Cesarean Section - 1   Diagnostic Studies History Sharon Owen(Sharon [place], [place]; 09/24/2016 11:40 AM) Colonoscopy  never Mammogram  1-3 years ago  Allergies Sharon Owen(Sharon Owen, CMA; 09/24/2016 11:43 AM) Gabapentin *CHEMICALS*  Latex  Tape 1"X5yd *MEDICAL DEVICES AND SUPPLIES*  Morphine Derivatives  Meloxicam *ANALGESICS - ANTI-INFLAMMATORY*  Hydrocodone-Acetaminophen *ANALGESICS - OPIOID*  Allergies Reconciled   Medication History Sharon Owen(Sharon Owen, CMA; 09/24/2016 11:45 AM) Bystolic (10MG  Tablet, Oral) Active. HydroCHLOROthiazide (25MG  Tablet, Oral) Active. Zolpidem Tartrate (10MG  Tablet, Oral) Active. NexIUM (40MG  Capsule DR, Oral) Active. Medications  Reconciled  Social History Sharon Owen(Sharon Owen, [place]; 09/24/2016 11:40 AM) Alcohol use  Occasional alcohol use. Caffeine use  Carbonated beverages, Coffee, Tea. No drug use  Tobacco use  Never smoker.  Family History Sharon Owen(Sharon [place], [place]; 09/24/2016 11:40 AM) Arthritis  Father, Mother. Diabetes Mellitus  Father, Sister. Hypertension  Father, Mother, Sister.  Pregnancy / Birth History Sharon Owen(Sharon [place], [place]; 09/24/2016 11:40 AM) Age at menarche  14 years. Contraceptive History  Intrauterine device. Gravida  6 Irregular periods  Length (months) of breastfeeding  3-6 Maternal age  51-25 Para  4  Other Problems Sharon Owen(Sharon Owen, CMA; 09/24/2016 11:40 AM) Anxiety Disorder  Back Pain  Gastroesophageal Reflux Disease  High blood pressure     Review of Systems Sharon Owen(Sharon Owen CMA; 09/24/2016 11:40 AM) General Not Present- Appetite Loss, Chills, Fatigue, Fever, Night Sweats, Weight Gain and Weight Loss. Skin Not Present- Change in Wart/Mole, Dryness, Hives, Jaundice, New Lesions, Non-Healing Wounds, Rash and Ulcer. HEENT Present- Seasonal Allergies. Not Present- Earache, Hearing Loss, Hoarseness, Nose Bleed, Oral Ulcers, Ringing in the Ears, Sinus Pain, Sore Throat, Visual Disturbances, Wears glasses/contact lenses and Yellow Eyes. Respiratory Not Present- Bloody sputum, Chronic Cough, Difficulty Breathing, Snoring and Wheezing. Breast Not Present- Breast Mass, Breast Pain, Nipple Discharge and Skin Changes. Cardiovascular Present- Rapid Heart Rate. Not Present- Chest Pain, Difficulty Breathing Lying Down, Leg Cramps, Palpitations, Shortness of Breath and Swelling of Extremities. Gastrointestinal Present- Indigestion. Not Present- Abdominal Pain, Bloating, Bloody Stool, Change in Bowel Habits, Chronic diarrhea, Constipation, Difficulty Swallowing, Excessive gas, Gets full quickly at meals, Hemorrhoids, Nausea, Rectal Pain and Vomiting. Female Genitourinary Not Present- Frequency,  Nocturia, Painful Urination, Pelvic Pain and Urgency. Musculoskeletal Present- Joint Pain and Joint Stiffness. Not Present- Back Pain, Muscle Pain, Muscle  Weakness and Swelling of Extremities. Neurological Present- Headaches. Not Present- Decreased Memory, Fainting, Numbness, Seizures, Tingling, Tremor, Trouble walking and Weakness. Psychiatric Present- Anxiety. Not Present- Bipolar, Change in Sleep Pattern, Depression, Fearful and Frequent crying. Endocrine Present- Hot flashes. Not Present- Cold Intolerance, Excessive Hunger, Hair Changes, Heat Intolerance and New Diabetes. Hematology Not Present- Blood Thinners, Easy Bruising, Excessive bleeding, Gland problems, HIV and Persistent Infections.  Vitals Sharon Owen CMA; 09/24/2016 11:45 AM) 09/24/2016 11:45 AM Weight: 263.4 lb Height: 65in Body Surface Area: 2.22 m Body Mass Index: 43.83 kg/m  Temp.: 98.59F  Pulse: 86 (Regular)  BP: 130/82 (Sitting, Left Arm, Standard)       Physical Exam (Veleda Mun B. Sharon Deutscher MD; 09/24/2016 12:46 PM) General Note: WD obese in her thighs AAF NAD HEENT unremarkable Neck supple Chest clear Heart SR without murmurs Abdomen nontender GU not examined Ext FROM without history of DVT Neuro alert and oriented x 3     Assessment & Plan Sharon Owen B. Sharon Deutscher MD; 09/24/2016 12:49 PM) MORBID OBESITY, UNSPECIFIED OBESITY TYPE (E66.01) Impression: morbid obesity with multiple comorbidities (hypertension and hyperlipidemia) and BMI 43. For sleeve gastrectomy on Monday UGI was negative but she has reflux  Matt B. Sharon Deutscher, MD, FACS

## 2017-02-23 NOTE — Telephone Encounter (Signed)
Refill request for general medication: Ambien 10 mg  Last office visit: 10/15/2016  Last physical exam: None indicated  Follow up visit: none indicated  Patient would also like to inform you that she is having surgery on Monday.

## 2017-02-27 NOTE — Anesthesia Preprocedure Evaluation (Signed)
Anesthesia Evaluation  Patient identified by MRN, date of birth, ID band Patient awake    Reviewed: Allergy & Precautions, H&P , Patient's Chart, lab work & pertinent test results, reviewed documented beta blocker date and time   Airway Mallampati: II  TM Distance: >3 FB Neck ROM: full    Dental no notable dental hx.    Pulmonary    Pulmonary exam normal breath sounds clear to auscultation       Cardiovascular hypertension,  Rhythm:regular Rate:Normal     Neuro/Psych    GI/Hepatic GERD  ,  Endo/Other  Morbid obesity  Renal/GU      Musculoskeletal   Abdominal   Peds  Hematology   Anesthesia Other Findings   Reproductive/Obstetrics                             Anesthesia Physical Anesthesia Plan  ASA: III  Anesthesia Plan:    Post-op Pain Management:    Induction: Intravenous  PONV Risk Score and Plan: 2 and Treatment may vary due to age or medical condition, Dexamethasone and Ondansetron  Airway Management Planned: Oral ETT  Additional Equipment:   Intra-op Plan:   Post-operative Plan: Extubation in OR  Informed Consent: I have reviewed the patients History and Physical, chart, labs and discussed the procedure including the risks, benefits and alternatives for the proposed anesthesia with the patient or authorized representative who has indicated his/her understanding and acceptance.   Dental Advisory Given  Plan Discussed with: CRNA and Surgeon  Anesthesia Plan Comments: (Grade I when BMI was 37  )        Anesthesia Quick Evaluation

## 2017-02-28 ENCOUNTER — Other Ambulatory Visit: Payer: Self-pay

## 2017-02-28 ENCOUNTER — Inpatient Hospital Stay (HOSPITAL_COMMUNITY)
Admission: RE | Admit: 2017-02-28 | Discharge: 2017-03-01 | DRG: 621 | Disposition: A | Discharge status: Home / Self Care | Payer: 59 | Source: Ambulatory Visit | Attending: Surgery | Admitting: Surgery

## 2017-02-28 ENCOUNTER — Encounter (HOSPITAL_COMMUNITY): Payer: Self-pay | Admitting: *Deleted

## 2017-02-28 ENCOUNTER — Inpatient Hospital Stay (HOSPITAL_COMMUNITY): Payer: 59 | Admitting: Anesthesiology

## 2017-02-28 ENCOUNTER — Encounter (HOSPITAL_COMMUNITY)
Admission: RE | Disposition: A | Discharge status: Home / Self Care | Payer: Self-pay | Source: Ambulatory Visit | Attending: Surgery

## 2017-02-28 DIAGNOSIS — Z886 Allergy status to analgesic agent status: Secondary | ICD-10-CM

## 2017-02-28 DIAGNOSIS — Z885 Allergy status to narcotic agent status: Secondary | ICD-10-CM | POA: Diagnosis not present

## 2017-02-28 DIAGNOSIS — F419 Anxiety disorder, unspecified: Secondary | ICD-10-CM | POA: Diagnosis present

## 2017-02-28 DIAGNOSIS — K449 Diaphragmatic hernia without obstruction or gangrene: Secondary | ICD-10-CM | POA: Diagnosis present

## 2017-02-28 DIAGNOSIS — Z9884 Bariatric surgery status: Secondary | ICD-10-CM

## 2017-02-28 DIAGNOSIS — Z6841 Body Mass Index (BMI) 40.0 and over, adult: Secondary | ICD-10-CM | POA: Diagnosis not present

## 2017-02-28 DIAGNOSIS — Z91040 Latex allergy status: Secondary | ICD-10-CM | POA: Diagnosis not present

## 2017-02-28 DIAGNOSIS — Z91048 Other nonmedicinal substance allergy status: Secondary | ICD-10-CM | POA: Diagnosis not present

## 2017-02-28 DIAGNOSIS — Z7982 Long term (current) use of aspirin: Secondary | ICD-10-CM | POA: Diagnosis not present

## 2017-02-28 DIAGNOSIS — F411 Generalized anxiety disorder: Secondary | ICD-10-CM

## 2017-02-28 DIAGNOSIS — Z791 Long term (current) use of non-steroidal anti-inflammatories (NSAID): Secondary | ICD-10-CM | POA: Diagnosis not present

## 2017-02-28 DIAGNOSIS — K219 Gastro-esophageal reflux disease without esophagitis: Secondary | ICD-10-CM | POA: Diagnosis present

## 2017-02-28 DIAGNOSIS — Z79899 Other long term (current) drug therapy: Secondary | ICD-10-CM

## 2017-02-28 DIAGNOSIS — I10 Essential (primary) hypertension: Secondary | ICD-10-CM | POA: Diagnosis present

## 2017-02-28 DIAGNOSIS — M549 Dorsalgia, unspecified: Secondary | ICD-10-CM | POA: Diagnosis present

## 2017-02-28 DIAGNOSIS — F5101 Primary insomnia: Secondary | ICD-10-CM

## 2017-02-28 HISTORY — PX: VENTRAL HERNIA REPAIR: SHX424

## 2017-02-28 HISTORY — PX: LAPAROSCOPIC GASTRIC SLEEVE RESECTION: SHX5895

## 2017-02-28 LAB — CREATININE, SERUM
Creatinine, Ser: 0.89 mg/dL (ref 0.44–1.00)
GFR calc Af Amer: >60 mL/min (ref >60)
GFR calc non Af Amer: >60 mL/min (ref >60)

## 2017-02-28 LAB — CBC
HCT: 41 % (ref 36.0–46.0)
Hemoglobin: 13.5 g/dL (ref 12.0–15.0)
MCH: 29.3 pg (ref 26.0–34.0)
MCHC: 32.9 g/dL (ref 30.0–36.0)
MCV: 89.1 fL (ref 78.0–100.0)
Platelets: 270 10*3/uL (ref 150–400)
RBC: 4.6 MIL/uL (ref 3.87–5.11)
RDW: 14.8 % (ref 11.5–15.5)
WBC: 11.3 10*3/uL — ABNORMAL HIGH (ref 4.0–10.5)

## 2017-02-28 LAB — HEMOGLOBIN AND HEMATOCRIT, BLOOD
HCT: 39.5 % (ref 36.0–46.0)
Hemoglobin: 12.8 g/dL (ref 12.0–15.0)

## 2017-02-28 SURGERY — GASTRECTOMY, SLEEVE, LAPAROSCOPIC
Anesthesia: General | Site: Abdomen

## 2017-02-28 MED ORDER — OXYCODONE HCL 5 MG/5ML PO SOLN
5.0000 mg | ORAL | Status: DC | PRN
  Administered 2017-02-28: 5 mg via ORAL
  Administered 2017-03-01 (×2): 10 mg via ORAL
  Filled 2017-02-28: qty 10
  Filled 2017-02-28 (×2): qty 5
  Filled 2017-02-28: qty 10

## 2017-02-28 MED ORDER — HEPARIN SODIUM (PORCINE) 5000 UNIT/ML IJ SOLN
5000.0000 [IU] | INTRAMUSCULAR | Status: AC
  Administered 2017-02-28: 5000 [IU] via SUBCUTANEOUS
  Filled 2017-02-28: qty 1

## 2017-02-28 MED ORDER — ROCURONIUM BROMIDE 10 MG/ML (PF) SYRINGE
PREFILLED_SYRINGE | INTRAVENOUS | Status: DC | PRN
  Administered 2017-02-28: 10 mg via INTRAVENOUS
  Administered 2017-02-28: 50 mg via INTRAVENOUS

## 2017-02-28 MED ORDER — DEXAMETHASONE SODIUM PHOSPHATE 4 MG/ML IJ SOLN: 4.0000 mg | INTRAMUSCULAR | Status: DC

## 2017-02-28 MED ORDER — MIDAZOLAM HCL 5 MG/5ML IJ SOLN
INTRAMUSCULAR | Status: DC | PRN
  Administered 2017-02-28: 2 mg via INTRAVENOUS

## 2017-02-28 MED ORDER — SUGAMMADEX SODIUM 200 MG/2ML IV SOLN
INTRAVENOUS | Status: AC
  Filled 2017-02-28: qty 2

## 2017-02-28 MED ORDER — SCOPOLAMINE 1 MG/3DAYS TD PT72
1.0000 | MEDICATED_PATCH | TRANSDERMAL | Status: DC
  Administered 2017-02-28: 1.5 mg via TRANSDERMAL
  Filled 2017-02-28: qty 1

## 2017-02-28 MED ORDER — FENTANYL CITRATE (PF) 100 MCG/2ML IJ SOLN
INTRAMUSCULAR | Status: AC
  Filled 2017-02-28: qty 4

## 2017-02-28 MED ORDER — CHLORHEXIDINE GLUCONATE CLOTH 2 % EX PADS: 6.0000 | MEDICATED_PAD | Freq: Once | CUTANEOUS | Status: DC

## 2017-02-28 MED ORDER — HEPARIN SODIUM (PORCINE) 5000 UNIT/ML IJ SOLN
5000.0000 [IU] | Freq: Three times a day (TID) | INTRAMUSCULAR | Status: DC
  Administered 2017-02-28 – 2017-03-01 (×3): 5000 [IU] via SUBCUTANEOUS
  Filled 2017-02-28 (×3): qty 1

## 2017-02-28 MED ORDER — ONDANSETRON HCL 4 MG/2ML IJ SOLN
INTRAMUSCULAR | Status: AC
  Filled 2017-02-28: qty 2

## 2017-02-28 MED ORDER — EPHEDRINE 5 MG/ML INJ
INTRAVENOUS | Status: AC
  Filled 2017-02-28: qty 10

## 2017-02-28 MED ORDER — PROMETHAZINE HCL 25 MG/ML IJ SOLN: 6.2500 mg | INTRAMUSCULAR | Status: DC | PRN

## 2017-02-28 MED ORDER — SODIUM CHLORIDE 0.9 % IJ SOLN
INTRAMUSCULAR | Status: DC | PRN
  Administered 2017-02-28: 10 mL

## 2017-02-28 MED ORDER — APREPITANT 40 MG PO CAPS
40.0000 mg | ORAL_CAPSULE | ORAL | Status: AC
  Administered 2017-02-28: 40 mg via ORAL
  Filled 2017-02-28: qty 1

## 2017-02-28 MED ORDER — LIDOCAINE 2% (20 MG/ML) 5 ML SYRINGE
INTRAMUSCULAR | Status: DC | PRN
  Administered 2017-02-28: 100 mg via INTRAVENOUS

## 2017-02-28 MED ORDER — PROPOFOL 10 MG/ML IV BOLUS
INTRAVENOUS | Status: DC | PRN
  Administered 2017-02-28: 250 mg via INTRAVENOUS

## 2017-02-28 MED ORDER — GABAPENTIN 300 MG PO CAPS
300.0000 mg | ORAL_CAPSULE | ORAL | Status: AC
  Administered 2017-02-28: 300 mg via ORAL
  Filled 2017-02-28: qty 1

## 2017-02-28 MED ORDER — NEBIVOLOL HCL 10 MG PO TABS
10.0000 mg | ORAL_TABLET | Freq: Every day | ORAL | Status: DC
  Administered 2017-02-28: 10 mg via ORAL
  Filled 2017-02-28 (×2): qty 1

## 2017-02-28 MED ORDER — PROPOFOL 10 MG/ML IV BOLUS
INTRAVENOUS | Status: AC
  Filled 2017-02-28: qty 40

## 2017-02-28 MED ORDER — PROMETHAZINE HCL 25 MG/ML IJ SOLN
INTRAMUSCULAR | Status: AC
  Filled 2017-02-28: qty 1

## 2017-02-28 MED ORDER — EPHEDRINE SULFATE-NACL 50-0.9 MG/10ML-% IV SOSY
PREFILLED_SYRINGE | INTRAVENOUS | Status: DC | PRN
  Administered 2017-02-28: 10 mg via INTRAVENOUS
  Administered 2017-02-28 (×2): 5 mg via INTRAVENOUS

## 2017-02-28 MED ORDER — LACTATED RINGERS IR SOLN
Status: DC | PRN
Start: 2017-02-28 — End: 2017-02-28
  Administered 2017-02-28: 1000 mL

## 2017-02-28 MED ORDER — KCL IN DEXTROSE-NACL 20-5-0.45 MEQ/L-%-% IV SOLN
INTRAVENOUS | Status: DC
  Administered 2017-02-28: 15:00:00 via INTRAVENOUS
  Filled 2017-02-28 (×2): qty 1000

## 2017-02-28 MED ORDER — PHENYLEPHRINE 40 MCG/ML (10ML) SYRINGE FOR IV PUSH (FOR BLOOD PRESSURE SUPPORT)
PREFILLED_SYRINGE | INTRAVENOUS | Status: AC
  Filled 2017-02-28: qty 10

## 2017-02-28 MED ORDER — BUPIVACAINE LIPOSOME 1.3 % IJ SUSP
20.0000 mL | Freq: Once | INTRAMUSCULAR | Status: AC
  Administered 2017-02-28: 20 mL
  Filled 2017-02-28: qty 20

## 2017-02-28 MED ORDER — ACETAMINOPHEN 500 MG PO TABS
1000.0000 mg | ORAL_TABLET | ORAL | Status: AC
  Administered 2017-02-28: 1000 mg via ORAL
  Filled 2017-02-28: qty 2

## 2017-02-28 MED ORDER — PANTOPRAZOLE SODIUM 40 MG IV SOLR
40.0000 mg | Freq: Every day | INTRAVENOUS | Status: DC
  Administered 2017-02-28: 40 mg via INTRAVENOUS
  Filled 2017-02-28: qty 40

## 2017-02-28 MED ORDER — FENTANYL CITRATE (PF) 100 MCG/2ML IJ SOLN
INTRAMUSCULAR | Status: AC
  Filled 2017-02-28: qty 2

## 2017-02-28 MED ORDER — MIDAZOLAM HCL 2 MG/2ML IJ SOLN
INTRAMUSCULAR | Status: AC
  Filled 2017-02-28: qty 2

## 2017-02-28 MED ORDER — ACETAMINOPHEN 160 MG/5ML PO SOLN: 650.0000 mg | ORAL | Status: DC | PRN

## 2017-02-28 MED ORDER — 0.9 % SODIUM CHLORIDE (POUR BTL) OPTIME
TOPICAL | Status: DC | PRN
  Administered 2017-02-28: 1000 mL

## 2017-02-28 MED ORDER — ONDANSETRON HCL 4 MG/2ML IJ SOLN
4.0000 mg | INTRAMUSCULAR | Status: DC | PRN
  Administered 2017-02-28 (×2): 4 mg via INTRAVENOUS
  Filled 2017-02-28 (×2): qty 2

## 2017-02-28 MED ORDER — CEFOTETAN DISODIUM-DEXTROSE 2-2.08 GM-%(50ML) IV SOLR
INTRAVENOUS | Status: AC
  Filled 2017-02-28: qty 50

## 2017-02-28 MED ORDER — CEFOTETAN DISODIUM-DEXTROSE 2-2.08 GM-%(50ML) IV SOLR
2.0000 g | INTRAVENOUS | Status: AC
  Administered 2017-02-28: 2 g via INTRAVENOUS

## 2017-02-28 MED ORDER — DEXAMETHASONE SODIUM PHOSPHATE 10 MG/ML IJ SOLN
INTRAMUSCULAR | Status: AC
  Filled 2017-02-28: qty 1

## 2017-02-28 MED ORDER — ROCURONIUM BROMIDE 50 MG/5ML IV SOSY
PREFILLED_SYRINGE | INTRAVENOUS | Status: AC
  Filled 2017-02-28: qty 5

## 2017-02-28 MED ORDER — LACTATED RINGERS IV SOLN: INTRAVENOUS | Status: DC

## 2017-02-28 MED ORDER — FENTANYL CITRATE (PF) 100 MCG/2ML IJ SOLN
25.0000 ug | INTRAMUSCULAR | Status: DC | PRN
  Administered 2017-02-28 (×3): 25 ug via INTRAVENOUS

## 2017-02-28 MED ORDER — PREMIER PROTEIN SHAKE
2.0000 [oz_av] | ORAL | Status: DC
  Administered 2017-03-01 (×2): 2 [oz_av] via ORAL

## 2017-02-28 MED ORDER — ONDANSETRON HCL 4 MG/2ML IJ SOLN
4.0000 mg | Freq: Once | INTRAMUSCULAR | Status: AC
  Administered 2017-02-28: 4 mg via INTRAVENOUS

## 2017-02-28 MED ORDER — LIDOCAINE 2% (20 MG/ML) 5 ML SYRINGE
INTRAMUSCULAR | Status: AC
Start: 2017-02-28 — End: ?
  Filled 2017-02-28: qty 5

## 2017-02-28 MED ORDER — SUGAMMADEX SODIUM 500 MG/5ML IV SOLN
INTRAVENOUS | Status: DC | PRN
  Administered 2017-02-28: 240 mg via INTRAVENOUS

## 2017-02-28 MED ORDER — CELECOXIB 200 MG PO CAPS
400.0000 mg | ORAL_CAPSULE | ORAL | Status: AC
  Administered 2017-02-28: 400 mg via ORAL
  Filled 2017-02-28 (×2): qty 2

## 2017-02-28 MED ORDER — DEXAMETHASONE SODIUM PHOSPHATE 10 MG/ML IJ SOLN
INTRAMUSCULAR | Status: DC | PRN
  Administered 2017-02-28: 10 mg via INTRAVENOUS

## 2017-02-28 MED ORDER — SUGAMMADEX SODIUM 500 MG/5ML IV SOLN
INTRAVENOUS | Status: AC
  Filled 2017-02-28: qty 5

## 2017-02-28 MED ORDER — LACTATED RINGERS IV SOLN
INTRAVENOUS | Status: DC | PRN
  Administered 2017-02-28: 07:00:00 via INTRAVENOUS

## 2017-02-28 MED ORDER — ONDANSETRON HCL 4 MG/2ML IJ SOLN
INTRAMUSCULAR | Status: DC | PRN
  Administered 2017-02-28: 4 mg via INTRAVENOUS

## 2017-02-28 MED ORDER — SODIUM CHLORIDE 0.9 % IJ SOLN
INTRAMUSCULAR | Status: AC
  Filled 2017-02-28: qty 10

## 2017-02-28 MED ORDER — MORPHINE SULFATE (PF) 2 MG/ML IV SOLN
1.0000 mg | INTRAVENOUS | Status: DC | PRN
  Administered 2017-02-28: 1 mg via INTRAVENOUS
  Administered 2017-02-28: 2 mg via INTRAVENOUS
  Administered 2017-02-28: 1 mg via INTRAVENOUS
  Filled 2017-02-28 (×3): qty 1

## 2017-02-28 MED ORDER — PHENYLEPHRINE HCL 10 MG/ML IJ SOLN
INTRAVENOUS | Status: DC | PRN
  Administered 2017-02-28: 50 ug/min via INTRAVENOUS

## 2017-02-28 MED ORDER — FENTANYL CITRATE (PF) 100 MCG/2ML IJ SOLN
INTRAMUSCULAR | Status: DC | PRN
  Administered 2017-02-28 (×2): 50 ug via INTRAVENOUS

## 2017-02-28 MED ORDER — PHENYLEPHRINE HCL 10 MG/ML IJ SOLN
INTRAMUSCULAR | Status: AC
  Filled 2017-02-28: qty 2

## 2017-02-28 MED ORDER — PHENYLEPHRINE 40 MCG/ML (10ML) SYRINGE FOR IV PUSH (FOR BLOOD PRESSURE SUPPORT)
PREFILLED_SYRINGE | INTRAVENOUS | Status: DC | PRN
  Administered 2017-02-28 (×3): 80 ug via INTRAVENOUS

## 2017-02-28 MED ORDER — INFLUENZA VAC SPLIT QUAD 0.5 ML IM SUSY
0.5000 mL | PREFILLED_SYRINGE | INTRAMUSCULAR | Status: DC
  Filled 2017-02-28: qty 0.5

## 2017-02-28 SURGICAL SUPPLY — 55 items
APPLICATOR COTTON TIP 6IN STRL (MISCELLANEOUS) ×16 IMPLANT
APPLIER CLIP 5 13 M/L LIGAMAX5 (MISCELLANEOUS)
APPLIER CLIP ROT 10 11.4 M/L (STAPLE)
APPLIER CLIP ROT 13.4 12 LRG (CLIP)
BLADE SURG 15 STRL LF DISP TIS (BLADE) ×1 IMPLANT
BLADE SURG 15 STRL SS (BLADE) ×1
CABLE HIGH FREQUENCY MONO STRZ (ELECTRODE) ×2 IMPLANT
CLIP APPLIE 5 13 M/L LIGAMAX5 (MISCELLANEOUS) IMPLANT
CLIP APPLIE ROT 10 11.4 M/L (STAPLE) IMPLANT
CLIP APPLIE ROT 13.4 12 LRG (CLIP) IMPLANT
DERMABOND ADVANCED (GAUZE/BANDAGES/DRESSINGS) ×1
DERMABOND ADVANCED .7 DNX12 (GAUZE/BANDAGES/DRESSINGS) ×1 IMPLANT
DEVICE PMI PUNCTURE CLOSURE (MISCELLANEOUS) ×4 IMPLANT
DEVICE SUT QUICK LOAD TK 5 (STAPLE) ×4 IMPLANT
DEVICE SUT TI-KNOT TK 5X26 (MISCELLANEOUS) ×2 IMPLANT
DEVICE SUTURE ENDOST 10MM (ENDOMECHANICALS) ×2 IMPLANT
DISSECTOR BLUNT TIP ENDO 5MM (MISCELLANEOUS) IMPLANT
ELECT REM PT RETURN 15FT ADLT (MISCELLANEOUS) ×2 IMPLANT
GAUZE SPONGE 4X4 12PLY STRL (GAUZE/BANDAGES/DRESSINGS) IMPLANT
GLOVE BIOGEL M 8.0 STRL (GLOVE) ×2 IMPLANT
GOWN STRL REUS W/TWL XL LVL3 (GOWN DISPOSABLE) ×8 IMPLANT
HANDLE STAPLE EGIA 4 XL (STAPLE) ×2 IMPLANT
HOVERMATT SINGLE USE (MISCELLANEOUS) ×2 IMPLANT
KIT BASIN OR (CUSTOM PROCEDURE TRAY) ×2 IMPLANT
MARKER SKIN DUAL TIP RULER LAB (MISCELLANEOUS) ×2 IMPLANT
NEEDLE SPNL 22GX3.5 QUINCKE BK (NEEDLE) ×2 IMPLANT
PACK UNIVERSAL I (CUSTOM PROCEDURE TRAY) ×2 IMPLANT
RELOAD TRI 45 ART MED THCK BLK (STAPLE) ×2 IMPLANT
RELOAD TRI 45 ART MED THCK PUR (STAPLE) ×2 IMPLANT
RELOAD TRI 60 ART MED THCK BLK (STAPLE) ×2 IMPLANT
RELOAD TRI 60 ART MED THCK PUR (STAPLE) ×4 IMPLANT
SCISSORS LAP 5X45 EPIX DISP (ENDOMECHANICALS) IMPLANT
SET IRRIG TUBING LAPAROSCOPIC (IRRIGATION / IRRIGATOR) ×2 IMPLANT
SHEARS HARMONIC ACE PLUS 45CM (MISCELLANEOUS) ×2 IMPLANT
SLEEVE ADV FIXATION 5X100MM (TROCAR) ×4 IMPLANT
SLEEVE GASTRECTOMY 36FR VISIGI (MISCELLANEOUS) ×2 IMPLANT
SOLUTION ANTI FOG 6CC (MISCELLANEOUS) ×2 IMPLANT
SPONGE LAP 18X18 X RAY DECT (DISPOSABLE) ×2 IMPLANT
STAPLER VISISTAT 35W (STAPLE) ×2 IMPLANT
SUT MNCRL AB 4-0 PS2 18 (SUTURE) ×4 IMPLANT
SUT SURGIDAC NAB ES-9 0 48 120 (SUTURE) ×4 IMPLANT
SUT VIC AB 4-0 SH 18 (SUTURE) ×2 IMPLANT
SUT VICRYL 0 TIES 12 18 (SUTURE) ×2 IMPLANT
SYR 10ML ECCENTRIC (SYRINGE) ×2 IMPLANT
SYR 20CC LL (SYRINGE) ×2 IMPLANT
SYR 50ML LL SCALE MARK (SYRINGE) ×2 IMPLANT
TOWEL OR 17X26 10 PK STRL BLUE (TOWEL DISPOSABLE) ×4 IMPLANT
TOWEL OR NON WOVEN STRL DISP B (DISPOSABLE) ×2 IMPLANT
TRAY FOLEY W/METER SILVER 16FR (SET/KITS/TRAYS/PACK) IMPLANT
TROCAR ADV FIXATION 5X100MM (TROCAR) ×2 IMPLANT
TROCAR BLADELESS 15MM (ENDOMECHANICALS) ×2 IMPLANT
TROCAR BLADELESS OPT 5 100 (ENDOMECHANICALS) ×2 IMPLANT
TUBE CALIBRATION LAPBAND (TUBING) IMPLANT
TUBING CONNECTING 10 (TUBING) ×4 IMPLANT
TUBING ENDO SMARTCAP (MISCELLANEOUS) ×2 IMPLANT

## 2017-02-28 NOTE — Anesthesia Postprocedure Evaluation (Signed)
Anesthesia Post Note  Patient: Sharon Owen  Procedure(s) Performed: LAPAROSCOPIC GASTRIC SLEEVE RESECTION, UPPER ENDOSCOPY WITH HIATAL HERNIA REPAIR (N/A Abdomen)     Patient location during evaluation: PACU Anesthesia Type: General Level of consciousness: awake and alert Pain management: pain level controlled Vital Signs Assessment: post-procedure vital signs reviewed and stable Respiratory status: spontaneous breathing, nonlabored ventilation, respiratory function stable and patient connected to nasal cannula oxygen Cardiovascular status: blood pressure returned to baseline and stable Postop Assessment: no apparent nausea or vomiting Anesthetic complications: no    Last Vitals:  Vitals:   02/28/17 1003 02/28/17 1015  BP: 118/64 (!) 106/47  Pulse: 77 76  Resp: 11 (!) 23  Temp: (!) 36.4 C   SpO2: 98% 97%    Last Pain:  Vitals:   02/28/17 1011  TempSrc:   PainSc: (P) Asleep                 Sharon Owen EDWARD

## 2017-02-28 NOTE — Progress Notes (Signed)
Discussed post op day goals with patient including ambulation, IS, diet progression, pain, and nausea control.  Questions answered. 

## 2017-02-28 NOTE — Discharge Instructions (Signed)
° ° ° °GASTRIC BYPASS/SLEEVE ° Home Care Instructions ° ° These instructions are to help you care for yourself when you go home. ° °Call: If you have any problems. °• Call [phone] and ask for the surgeon on call °• If you need immediate assistance come to the ER at [place] [facility]. Tell the ER staff you are a new post-op gastric bypass or gastric sleeve patient  °Signs and symptoms to report: • Severe  vomiting or nausea °o If you cannot handle clear liquids for longer than 1 day, call your surgeon °• Abdominal pain which does not get better after taking your pain medication °• Fever greater than 100.4°  F and chills °• Heart rate over 100 beats a minute °• Trouble breathing °• Chest pain °• Redness,  swelling, drainage, or foul odor at incision (surgical) sites °• If your incisions open or pull apart °• Swelling or pain in calf (lower leg) °• Diarrhea (Loose bowel movements that happen often), frequent watery, uncontrolled bowel movements °• Constipation, (no bowel movements for 3 days) if this happens: °o Take Milk of Magnesia, 2 tablespoons by mouth, 3 times a day for 2 days if needed °o Stop taking Milk of Magnesia once you have had a bowel movement °o Call your doctor if constipation continues °Or °o Take Miralax  (instead of Milk of Magnesia) following the label instructions °o Stop taking Miralax once you have had a bowel movement °o Call your doctor if constipation continues °• Anything you think is “abnormal for you” °  °Normal side effects after surgery: • Unable to sleep at night or unable to concentrate °• Irritability °• Being tearful (crying) or depressed ° °These are common complaints, possibly related to your anesthesia, stress of surgery, and change in lifestyle, that usually go away a few weeks after surgery. If these feelings continue, call your medical doctor.  °Wound Care: You may have surgical glue, steri-strips, or staples over your incisions after surgery °• Surgical glue: Looks like clear  film over your incisions and will wear off a little at a time °• Steri-strips: Adhesive strips of tape over your incisions. You may notice a yellowish color on skin under the steri-strips. This is used to make the steri-strips stick better. Do not pull the steri-strips off - let them fall off °• Staples: Staples may be removed before you leave the hospital °o If you go home with staples, call [facility] for an appointment with your surgeon’s nurse to have staples removed 10 days after surgery, [phone] °• Showering: You may shower two (2) days after your surgery unless your surgeon tells you differently °o Wash gently around incisions with warm soapy water, rinse well, and gently pat dry °o If you have a drain (tube from your incision), you may need someone to hold this while you shower °o No tub baths until staples are removed and incisions are healed °  °Medications: • Medications should be liquid or crushed if larger than the size of a dime °• Extended release pills (medication that releases a little bit at a time through the  day) should not be crushed °• Depending on the size and number of medications you take, you may need to space (take a few throughout the day)/change the time you take your medications so that you do not over-fill your pouch (smaller stomach) °• Make sure you follow-up with you primary care physician to make medication changes needed during rapid weight loss and life -style changes °•   If you have diabetes, follow up with your doctor that orders your diabetes medication(s) within one week after surgery and check your blood sugar regularly ° °• Do not drive while taking narcotics (pain medications) ° °• Do not take acetaminophen (Tylenol) and Roxicet or Lortab Elixir at the same time since these pain medications contain acetaminophen °  °Diet:  °First 2 Weeks You will see the nutritionist about two (2) weeks after your surgery. The nutritionist will increase the types of  foods you can eat if you are handling liquids well: °• If you have severe vomiting or nausea and cannot handle clear liquids lasting longer than 1 day call your surgeon °Protein Shake °• Drink at least 2 ounces of shake 5-6 times per day °• Each serving of protein shakes (usually 8-12 ounces) should have a minimum of: °o 15 grams of protein °o And no more than 5 grams of carbohydrate °• Goal for protein each day: °o Men = 80 grams per day °o Women = 60 grams per day °  ° • Protein powder may be added to fluids such as non-fat milk or Lactaid milk or Soy milk (limit to 35 grams added protein powder per serving) ° °Hydration °• Slowly increase the amount of water and other clear liquids as tolerated (See Acceptable Fluids) °• Slowly increase the amount of protein shake as tolerated °• Sip fluids slowly and throughout the day °• May use sugar substitutes in small amounts (no more than 6-8 packets per day; i.e. Splenda) ° °Fluid Goal °• The first goal is to drink at least 8 ounces of protein shake/drink per day (or as directed by the nutritionist); some examples of protein shakes are Syntrax Nectar, Adkins Advantage, EAS Edge HP, and Unjury. - See handout from pre-op Bariatric Education Class: °o Slowly increase the amount of protein shake you drink as tolerated °o You may find it easier to slowly sip shakes throughout the day °o It is important to get your proteins in first °• Your fluid goal is to drink 64-100 ounces of fluid daily °o It may take a few weeks to build up to this  °• 32 oz. (or more) should be clear liquids °And °• 32 oz. (or more) should be full liquids (see below for examples) °• Liquids should not contain sugar, caffeine, or carbonation ° °Clear Liquids: °• Water of Sugar-free flavored water (i.e. Fruit H²O, Propel) °• Decaffeinated coffee or tea (sugar-free) °• Crystal lite, Wyler’s Lite, Minute Maid Lite °• Sugar-free Jell-O °• Bouillon or broth °• Sugar-free Popsicle:    - Less than 20 calories  each; Limit 1 per day ° °Full Liquids: °                  Protein Shakes/Drinks + 2 choices per day of other full liquids °• Full liquids must be: °o No More Than 12 grams of Carbs per serving °o No More Than 3 grams of Fat per serving °• Strained low-fat cream soup °• Non-Fat milk °• Fat-free Lactaid Milk °• Sugar-free yogurt (Dannon Lite & Fit, Greek yogurt) ° °  °Vitamins and Minerals • Start 1 day after surgery unless otherwise directed by your surgeon °• 2 Chewable Bariatric Multivitamin / Multimineral Supplement with iron °• Chewable Calcium Citrate with Vitamin D-3 °(Example: 3 Chewable Calcium  Plus 600 with Vitamin D-3) °o Take 500 mg three (3) times a day for a total of 1500 mg each day °o Do not take all 3 doses of calcium   at one time as it may cause constipation, and you can only absorb 500 mg at a time °o Do not mix multivitamins containing iron with calcium supplements;  take 2 hours apart °• Menstruating women and those at risk for anemia ( a blood disease that causes weakness) may need extra iron °o Talk to your doctor to see if you need more iron °• If you need extra iron: Total daily Iron recommendation (including Vitamins) is 50 to 100 mg Iron/day °• Do not stop taking or change any vitamins or minerals until you talk to your nutritionist or surgeon °• Your nutritionist and/or surgeon must approve all vitamin and mineral supplements °  °Activity and Exercise: It is important to continue walking at home. Limit your physical activity as instructed by your doctor. During this time, use these guidelines: °• Do not lift anything greater than ten  (10) pounds for at least two (2) weeks °• Do not go back to work or drive until your surgeon says you can °• You may have sex when you feel comfortable °o It is VERY important for female patients to use a reliable birth control method; fertility often increase after surgery °o Do not get pregnant for at least 18 months °• Start exercising as soon as your  doctor tells you that you can °o Make sure your doctor approves any physical activity °• Start with a simple walking program °• Walk 5-15 minutes each day, 7 days per week °• Slowly increase until you are walking 30-45 minutes per day °• Consider joining our BELT program. [phone] or email [email or web address] °  °Special Instructions Things to remember: °• Use your CPAP when sleeping if this applies to you °• Consider buying a medical alert bracelet that says you had lap-band surgery °  °  You will likely have your first fill (fluid added to your band) 6 - 8 weeks after surgery °• [facility] has a free Bariatric Surgery Support Group that meets monthly, the 3rd Thursday, 6pm.  Education Center Classrooms. You can see classes online at [email or web address] °• It is very important to keep all follow up appointments with your surgeon, nutritionist, primary care physician, and behavioral health practitioner °o After the first year, please follow up with your bariatric surgeon and nutritionist at least once a year in order to maintain best weight loss results °      °             [facility]:  [phone] ° °             [facility]: [phone] ° °             Bariatric Nurse Coordinator: [phone]  °Gastric Bypass/Sleeve Home Care Instructions  Rev. 05/2012    ° °                                                    Reviewed and Endorsed °                                                   by [place] Patient Education Committee, Jan, 2014 ° ° ° ° ° ° ° ° ° °

## 2017-02-28 NOTE — Op Note (Signed)
Surgeon: Wenda LowMatt Shalondra Wunschel, MD, FACS  Asst:  Ovidio Kinavid Newman, MD, FACS  Anes:  General endotracheal  Procedure: Laparoscopic repair of hiatal hernia which was visible and sleeve gastrectomy and upper endoscopy  Diagnosis: Morbid obesity  Complications: None  EBL:   Minimal cc  Description of Procedure:  The patient was take to OR 1 and given general anesthesia.  The abdomen was prepped with PCMX and draped sterilely.  A timeout was performed.  Access to the abdomen was achieved with a 5 mm Optiview through the left upper quadrant without difficulty..  Following insufflation, the state of the abdomen was found to be free of adhesions.  The gallbladder was robin's egg blue and looked normal there was a visible hiatal hernia that was significant despite a upper GI bed was labeled as normal.  We dissected the forgot completely going around anteriorly and reduced the hiatal hernia completely before repairing it posteriorly with 2 sutures of 0 Surgidac using the Endo Close and timeout.  This completely repaired and reduced the hiatal hernia..  The ViSiGi 36Fr tube was inserted to deflate the stomach and was pulled back into the esophagus.    The pylorus was identified and we measured 5 cm back and marked the antrum.  At that point we began dissection to take down the greater curvature of the stomach using the Harmonic scalpel.  This dissection was taken all the way up to the left crus.  Posterior attachments of the stomach were also taken down.    The ViSiGi tube was then passed into the antrum and suction applied so that it was snug along the lessor curvature.  The "crow's foot" or incisura was identified.  The sleeve gastrectomy was begun using the [employer] International[employer]ovidien platform stapler beginning with a black with TRUS 4.5 mm initially followed by a 6 black with TRUS followed by multiple 6 cm purple loads with TR S.  When the sleeve was complete the tube was taken off suction and insufflated briefly.  The tube was  withdrawn.  Upper endoscopy was then performed by Dr. Ezzard StandingNewman which showed a nice symmetrical cylindrical pouch, and appropriate antrum and pylorus.  No bleeding or bubbles were noted..     The specimen was extracted through the 15 trocar site.  Wounds were infiltrated with Exparel and the 15 mm port was closed with a single 0 Vicryl and then the skin incisions were closed and closed with 4-0 Monocryl.    Sharon B. Daphine DeutscherMartin, MD, [facility], [place] [phone]

## 2017-02-28 NOTE — Transfer of Care (Signed)
Immediate Anesthesia Transfer of Care Note  Patient: Sharon BlackbirdCassandra F Speiser  Procedure(s) Performed: LAPAROSCOPIC GASTRIC SLEEVE RESECTION, UPPER ENDOSCOPY WITH HIATAL HERNIA REPAIR (N/A Abdomen)  Patient Location: PACU  Anesthesia Type:General  Level of Consciousness: awake, alert , oriented and patient cooperative  Airway & Oxygen Therapy: Patient Spontanous Breathing and Patient connected to face mask oxygen  Post-op Assessment: Report given to RN, Post -op Vital signs reviewed and stable and Patient moving all extremities  Post vital signs: Reviewed and stable  Last Vitals:  Vitals:   02/28/17 0542  BP: (!) 145/74  Pulse: 64  Resp: 18  Temp: 36.6 C  SpO2: 100%    Last Pain:  Vitals:   02/28/17 0542  TempSrc: Oral         Complications: No apparent anesthesia complications

## 2017-02-28 NOTE — Interval H&P Note (Signed)
History and Physical Interval Note:  02/28/2017 7:15 AM  Sharon Owen  has presented today for surgery, with the diagnosis of Morbid Obesity, HTN, Hiatal Hernia, GERD, Back Pain, Lupus  The various methods of treatment have been discussed with the patient and family. After consideration of risks, benefits and other options for treatment, the patient has consented to  Procedure(s): LAPAROSCOPIC GASTRIC SLEEVE RESECTION, UPPER ENDO, POSSIBLE HIATAL HERNIA REPAIR (N/A) as a surgical intervention .  The patient's history has been reviewed, patient examined, no change in status, stable for surgery.  I have reviewed the patient's chart and labs.  Questions were answered to the patient's satisfaction.     Teka Chanda B

## 2017-02-28 NOTE — Anesthesia Procedure Notes (Signed)

## 2017-02-28 NOTE — Op Note (Signed)
Name:  Sharon Owen MRN: [id number] Date of Surgery: 02/28/2017  Preop Diagnosis:  Morbid Obesity  Postop Diagnosis:  Morbid Obesity (Weight - 263, BMI - 43.8), S/P Gastric Sleeve resection  Procedure:  Upper endoscopy  (Intraoperative)  Surgeon:  Ovidio Kinavid Bemnet Trovato, M.D.  Anesthesia:  GET  Indications for procedure: Sharon Owen is a 51 y.o. female whose primary care physician is Alba CorySowles, Krichna, MD and has completed a gastric sleeve resection today for weight loss by Dr. Daphine DeutscherMartin.  I am doing an intraoperative upper endoscopy to evaluate the gastric pouch after the sleeve gastrectomy.  Operative Note: The patient is under general anesthesia.  Dr. Daphine DeutscherMartin is laparoscoping the patient while I do an upper endoscopy to evaluate the stomach pouch.  With the patient intubated, I passed the Pentax upper endoscope without difficulty down the esophagus.  The esophagus was unremarkable.  The esophago-gastric junction was at 38 cm.  There is some mucosal irregularity at the EG junction.  The mucosa of the stomach looked viable and the staple line was intact without bleeding.  I advanced the scope to the pylorus, but did not go through it.  While I insufflated the stomach pouch with air, Dr. Daphine DeutscherMartin  flooded the upper abdomen with saline to put the gastric pouch under saline.  There was no bubbling or evidence of a leak.  There was no evidence of narrowing of the pouch and the gastric sleeve looked tubular.  The scope was then withdrawn.  The esophagus was unremarkable and the patient tolerated the endoscopy without difficulty.  Ovidio Kinavid Demarlo Riojas, MD, [facility] Pager: [phone] Office phone:  [phone]

## 2017-03-01 LAB — CBC WITH DIFFERENTIAL/PLATELET
Basophils Absolute: 0 10*3/uL (ref 0.0–0.1)
Basophils Relative: 0 %
Eosinophils Absolute: 0 10*3/uL (ref 0.0–0.7)
Eosinophils Relative: 0 %
HCT: 38.3 % (ref 36.0–46.0)
Hemoglobin: 12.6 g/dL (ref 12.0–15.0)
Lymphocytes Relative: 14 %
Lymphs Abs: 1.4 10*3/uL (ref 0.7–4.0)
MCH: 28.9 pg (ref 26.0–34.0)
MCHC: 32.9 g/dL (ref 30.0–36.0)
MCV: 87.8 fL (ref 78.0–100.0)
Monocytes Absolute: 0.8 10*3/uL (ref 0.1–1.0)
Monocytes Relative: 8 %
Neutro Abs: 7.5 10*3/uL (ref 1.7–7.7)
Neutrophils Relative %: 78 %
Platelets: 348 10*3/uL (ref 150–400)
RBC: 4.36 MIL/uL (ref 3.87–5.11)
RDW: 15 % (ref 11.5–15.5)
WBC: 9.8 10*3/uL (ref 4.0–10.5)

## 2017-03-01 MED ORDER — ALPRAZOLAM 0.5 MG PO TABS: ORAL_TABLET | ORAL | 0 refills | Status: DC

## 2017-03-01 MED ORDER — ZOLPIDEM TARTRATE 10 MG PO TABS: 5.0000 mg | ORAL_TABLET | Freq: Every evening | ORAL | 0 refills | Status: DC | PRN

## 2017-03-01 NOTE — Progress Notes (Signed)
Pt disconnected herself from her iv fluids and ambulating in the hall.  Refusing for iv to be reconnected.  States "I'm going home"   Is passing gas.

## 2017-03-01 NOTE — Progress Notes (Signed)
Patient given prescriptions and AVS.  No further questions.  Family at bedside.  Discharged via wheelchair.

## 2017-03-01 NOTE — Plan of Care (Signed)

## 2017-03-01 NOTE — Discharge Summary (Signed)
Physician Discharge Summary  Patient ID: Sharon Owen MRN: [id number] DOB/AGE: 51/12/1965 51 y.o.  Admit date: 02/28/2017 Discharge date: 03/01/2017  Admission Diagnoses: Morbid obesity  Discharge Diagnoses: Morbid obesity Principal Problem:   S/P laparoscopic sleeve gastrectomy with hiatal hernia repair Nov 2018   Surgery: Laparoscopic dissection of the forgot with repair of a hiatal hernia with 2 sutures posteriorly for visible hiatal hernia, laparoscopic sleeve gastrectomy  Discharged Condition: Improved  Hospital Course:   Patient underwent a sleeve gastrectomy on Monday.  She was begun on clear liquids that afternoon and evening and this was advanced and she was ready for discharge on Tuesday.  Incisions were doing fine and she was ready to go.  Consults: None  Significant Diagnostic Studies: None    Discharge Exam: Blood pressure 125/62, pulse (!) 50, temperature 98.3 F (36.8 C), temperature source Oral, resp. rate 18, height 5\' 5"  (1.651 m), weight 122.6 kg (270 lb 3.2 oz), SpO2 96 %. Incisions bland  Disposition: 01-Home or Self Care   Allergies as of 03/01/2017      Reactions   Adhesive [tape] Itching   Latex Itching   Meloxicam Other (See Comments)   Elevated BP No issues if taken PRN    Morphine And Related Rash      Medication List    TAKE these medications   ALPRAZolam 0.5 MG tablet Commonly known as:  XANAX TAKE HALF TO ONE TABLET BY MOUTH EVERY 4 HOURS AS NEEDED FOR ANXIETY What changed:    how much to take  how to take this  when to take this  reasons to take this  additional instructions   aspirin EC 81 MG tablet Take 1 tablet (81 mg total) by mouth daily.   cyclobenzaprine 10 MG tablet Commonly known as:  FLEXERIL Take 1 tablet (10 mg total) by mouth at bedtime.   diclofenac 50 MG EC tablet Commonly known as:  VOLTAREN Take 50 mg by mouth 2 (two) times daily as needed for moderate pain. Notes to patient:  Avoid NSAIDs for 6-8  weeks after surgery   diclofenac sodium 1 % Gel Commonly known as:  VOLTAREN Apply 2 g topically at bedtime as needed (for pain).   diphenhydrAMINE 25 MG tablet Commonly known as:  BENADRYL Take 25 mg by mouth every 6 (six) hours as needed for itching. Reported on 05/28/2015   esomeprazole 40 MG capsule Commonly known as:  NEXIUM Take 1 capsule (40 mg total) by mouth daily at 12 noon. Reported on 05/28/2015   hydrochlorothiazide 25 MG tablet Commonly known as:  HYDRODIURIL Take 1 tablet (25 mg total) by mouth daily. What changed:  how much to take   ibuprofen 200 MG tablet Commonly known as:  ADVIL,MOTRIN Take 600-800 mg by mouth every 6 (six) hours as needed for headache or moderate pain. Notes to patient:  Avoid NSAIDs for 6-8 weeks after surgery   MULTIVITAMIN PO Take 1 tablet by mouth daily.   nebivolol 10 MG tablet Commonly known as:  BYSTOLIC Take 1 tablet (10 mg total) by mouth daily. What changed:  when to take this   NOVOFINE 32G X 6 MM Misc Generic drug:  Insulin Pen Needle USE AS DIRECTED   zolpidem 10 MG tablet Commonly known as:  AMBIEN Take 0.5-1 tablets (5-10 mg total) at bedtime as needed by mouth for sleep.      Follow-up Information    [facility] Go on 03/30/2017.   Specialty:  General Surgery Why:  at 945 with Dr Wenda LowMatt Fillmore Bynum Contact information: [address] [place] [address] [phone]        [facility].   Specialty:  General Surgery Contact information: [address] [place] [address] [phone]           Signed: Valarie MerinoMARTIN,Meagan Ancona B 03/01/2017, 12:43 PM

## 2017-03-01 NOTE — Progress Notes (Signed)
Patient alert and oriented, Post op day 1.  Provided support and encouragement.  Encouraged pulmonary toilet, ambulation and small sips of liquids.  Completed 12 ounces of clear fluid and started protein shakes.  All questions answered.  Will continue to monitor.

## 2017-03-01 NOTE — Progress Notes (Signed)
Patient alert and oriented, pain is controlled. Patient is tolerating fluids, advanced to protein shake today, patient is tolerating well.  Reviewed Gastric sleeve discharge instructions with patient and patient is able to articulate understanding.  Provided information on BELT program, Support Group and [facility] outpatient pharmacy. All questions answered, will continue to monitor.  

## 2017-03-03 ENCOUNTER — Telehealth (HOSPITAL_COMMUNITY): Payer: Self-pay

## 2017-03-03 NOTE — Telephone Encounter (Signed)
Follow up with bariatric surgical patient to discuss post discharge questions.    1.  Are you having any pain not relieved by pain medication?No, stopped taking pain medication because making sleepy, encouraged tylenol for pain control  2.  How much fluid total fluid intake have you had in the last 24/48 hours?  8 ounces, we discussed this is not enough fluid.  Strategies provided to set timer to drink every 15 minutes, utilize diferent fluids like popsicles, flavored waters, ice chips.  Patient states drinking every 4 hours yesterday. Discussed dehydration, pt is nurse aware of symptoms  3.  How much protein intake have you had in the last 24/48 hours?4 grams, discussed increasing protein shakes as part of total fluid intake  4.  Have you had any trouble making urine?No patient stated she is making a lot of urine and light in color  5.  Have you had nausea that has not been relieved by nausea medication?taken one time and relieved nausea  6.  Are you ambulating every hour?she did not yesterday had a relaxing day per patient.  Discussed she needs to move every hour to decrease risk of clots and to help with fluid intake  7.  Are you passing gas or had a BM?passing gas no BM took MOM this am  8.  Do you know how to contact [facility]? [facility]? [facility]?yes  9.  Are you taking your vitamins and calcium without difficulty? yes  10. Tell me how your incision looks?  Any redness, open incision, or drainage?look good

## 2017-03-04 ENCOUNTER — Telehealth: Payer: Self-pay | Admitting: Registered"

## 2017-03-04 NOTE — Telephone Encounter (Signed)
RD called pt to check on fluid intake. Pt did not answer; left voicemail to return call. RD will follow-up with pt on next business day.

## 2017-03-04 NOTE — Telephone Encounter (Unsigned)
Copied from CRM [id number]. Topic: Quick Communication - See Telephone Encounter >> Mar 04, 2017  8:14 AM Gerrianne ScalePayne, Jamile Sivils L wrote: CRM for notification. See Telephone encounter for:  03/04/17.

## 2017-03-09 ENCOUNTER — Telehealth (HOSPITAL_COMMUNITY): Payer: Self-pay

## 2017-03-09 NOTE — Telephone Encounter (Signed)
Made discharge phone call to patient asking the following questions.    1. Do you have someone to care for you now that you are home?  yes 2. Are you having pain now that is not relieved by your pain medication?  No, my pain is ok  3. Are you able to drink the recommended daily amount of fluids (48 ounces minimum/day) and protein (60-80 grams/day) as prescribed by the dietitian or nutritional counselor?  I'm getting there, I am taking in about 30 grams of protein daily about 40 oz of water daily.  Patient encouraged to take small sips thereabout the day.  4. Are you taking the vitamins and minerals as prescribed?  Yes  5. Do you have the "on call" number to contact your surgeon if you have a problem or question?  Yes, ive spoken with Dawn once already.   6. Are your incisions free of redness, swelling or drainage? (If steri strips, address that these can fall off, shower as tolerated) yes 7. Have your bowels moved since your surgery?  If not, are you passing gas?  Only once. Reviewed the OTC medication for constipation with patient  8. Are you up and walking 3-4 times per day? Yes, more  9. Were you provided your discharge medications before your surgery or before you were discharged from the hospital and are you taking them without problem?  Yes  Patient had questions about having [place] for Thanksgiving.  Patient will meet with the dietician before Thanksgiving day, patient advised to follow diet that dietician recommends.

## 2017-03-11 ENCOUNTER — Ambulatory Visit: Payer: BLUE CROSS/BLUE SHIELD | Admitting: Family Medicine

## 2017-03-15 ENCOUNTER — Encounter: Payer: 59 | Attending: Surgery | Admitting: Skilled Nursing Facility1

## 2017-03-15 DIAGNOSIS — K219 Gastro-esophageal reflux disease without esophagitis: Secondary | ICD-10-CM | POA: Insufficient documentation

## 2017-03-15 DIAGNOSIS — I10 Essential (primary) hypertension: Secondary | ICD-10-CM | POA: Diagnosis not present

## 2017-03-15 DIAGNOSIS — E785 Hyperlipidemia, unspecified: Secondary | ICD-10-CM | POA: Insufficient documentation

## 2017-03-15 DIAGNOSIS — Z6841 Body Mass Index (BMI) 40.0 and over, adult: Secondary | ICD-10-CM | POA: Insufficient documentation

## 2017-03-15 DIAGNOSIS — K449 Diaphragmatic hernia without obstruction or gangrene: Secondary | ICD-10-CM | POA: Insufficient documentation

## 2017-03-16 ENCOUNTER — Encounter: Payer: Self-pay | Admitting: Skilled Nursing Facility1

## 2017-03-16 NOTE — Progress Notes (Signed)
Bariatric Class:  Appt start time: 1530 end time:  1630.  2 Week Post-Operative Nutrition Class  Patient was seen on 03/16/2017 for Post-Operative Nutrition education at [facility].   Surgery date: 02/28/2017 Surgery type: sleeve Start weight at [facility]: 270.8 Weight today: 246.4  TANITA  BODY COMP RESULTS  03/16/2017   BMI (kg/m^2) 41   Fat Mass (lbs) 127.4   Fat Free Mass (lbs) 119   Total Body Water (lbs) 87.2   The following the learning objectives were met by the patient during this course:  Identifies Phase 3A (Soft, High Proteins) Dietary Goals and will begin from 2 weeks post-operatively to 2 months post-operatively  Identifies appropriate sources of fluids and proteins   States protein recommendations and appropriate sources post-operatively  Identifies the need for appropriate texture modifications, mastication, and bite sizes when consuming solids  Identifies appropriate multivitamin and calcium sources post-operatively  Describes the need for physical activity post-operatively and will follow MD recommendations  States when to call healthcare provider regarding medication questions or post-operative complications  Handouts given during class include:  Phase 3A: Soft, High Protein Diet Handout  Follow-Up Plan: Patient will follow-up at [facility] in 6 weeks for 2 month post-op nutrition visit for diet advancement per MD.

## 2017-04-06 ENCOUNTER — Telehealth: Payer: Self-pay | Admitting: Family Medicine

## 2017-04-06 ENCOUNTER — Ambulatory Visit: Payer: BLUE CROSS/BLUE SHIELD | Admitting: Family Medicine

## 2017-04-06 DIAGNOSIS — F5101 Primary insomnia: Secondary | ICD-10-CM

## 2017-04-06 NOTE — Telephone Encounter (Signed)
C-

## 2017-04-06 NOTE — Telephone Encounter (Signed)
Copied from CRM [phone]. Topic: Quick Communication - Rx Refill/Question >> Apr 06, 2017  9:30 AM Sharon Owen, Sharon Owen wrote: Has the patient contacted their pharmacy? No (Agent: If no, request that the patient contact the pharmacy for the refill.) Preferred Pharmacy (with phone number or street name): [id number] [place],  - [address] Agent: Please be advised that RX refills may take up to 3 business days. We ask that you follow-up with your pharmacy. Patient needs refill on her zolpidem (AMBIEN) 10 MG tablet. Patient has an appt on 05/23/17 with Dr. Carlynn PurlSowles and is requesting to get enough to last her until her next appt. Please call patient when its ready for pick-up, thanks.

## 2017-04-06 NOTE — Telephone Encounter (Signed)
Luretha MurphyMatthew Martin wrote a prescription 11/06, controlled medication

## 2017-04-06 NOTE — Telephone Encounter (Signed)
Refill request for general medication: Ambien 10 mg  Last office visit: 10/15/2016  Last physical exam: None indicated  Follow up visit: 05/23/2017

## 2017-04-06 NOTE — Telephone Encounter (Signed)
Controlled substance 

## 2017-04-11 ENCOUNTER — Encounter: Payer: Self-pay | Admitting: Family Medicine

## 2017-04-13 ENCOUNTER — Other Ambulatory Visit: Payer: Self-pay

## 2017-04-13 NOTE — Telephone Encounter (Signed)
Left voice message for patient to schedule a sooner appointment for medication refill.

## 2017-04-13 NOTE — Telephone Encounter (Signed)
Sorry. Controlled medication. Needs follow up

## 2017-04-13 NOTE — Telephone Encounter (Signed)
Patient came in today requesting refill on Ambien. I told her that it was filled on 03/01/2017 and that is why it was being denied. She said he only filled it for 30 days and would like another refill.

## 2017-04-14 ENCOUNTER — Telehealth: Payer: Self-pay

## 2017-04-14 ENCOUNTER — Ambulatory Visit: Payer: Self-pay | Admitting: Registered"

## 2017-04-14 NOTE — Telephone Encounter (Signed)
I can see her at 1 pm tomorrow if she would like

## 2017-04-14 NOTE — Telephone Encounter (Signed)
Copied from CRM [id number]. Topic: General - Other >> Apr 13, 2017  3:00 PM Viviann SpareWhite, Selina wrote: Reason for CRM: Patient is very upset about not been able to get her med refill before her appt on 05/09/17 @ 3 pm, patient has been put on the waiting list as well. Patient is requesting a call back from Dr. Carlynn PurlSowles. Patient can be reached at [phone]

## 2017-04-15 ENCOUNTER — Ambulatory Visit: Payer: 59 | Admitting: Family Medicine

## 2017-04-15 NOTE — Telephone Encounter (Signed)
Please call to see if patient would like to bee seen for a refill of her medications today at 1.

## 2017-04-15 NOTE — Telephone Encounter (Signed)
Spoke with patient and she will be coming in today at 1pm

## 2017-04-28 ENCOUNTER — Encounter: Payer: Self-pay | Admitting: Registered"

## 2017-04-28 ENCOUNTER — Encounter: Payer: 59 | Attending: Surgery | Admitting: Registered"

## 2017-04-28 DIAGNOSIS — I10 Essential (primary) hypertension: Secondary | ICD-10-CM | POA: Insufficient documentation

## 2017-04-28 DIAGNOSIS — Z6841 Body Mass Index (BMI) 40.0 and over, adult: Secondary | ICD-10-CM | POA: Diagnosis not present

## 2017-04-28 DIAGNOSIS — E669 Obesity, unspecified: Secondary | ICD-10-CM

## 2017-04-28 DIAGNOSIS — E785 Hyperlipidemia, unspecified: Secondary | ICD-10-CM | POA: Insufficient documentation

## 2017-04-28 DIAGNOSIS — K449 Diaphragmatic hernia without obstruction or gangrene: Secondary | ICD-10-CM | POA: Insufficient documentation

## 2017-04-28 DIAGNOSIS — K219 Gastro-esophageal reflux disease without esophagitis: Secondary | ICD-10-CM | POA: Insufficient documentation

## 2017-04-28 NOTE — Progress Notes (Signed)
Follow-up visit:  8 Weeks Post-Operative Sleeve gastrectomy Surgery  Medical Nutrition Therapy: Appt start time: 11:45 end time: 12:40.  Primary concerns today: Post-operative Bariatric Surgery Nutrition Management.  Non scale victories: walking easier, more energy, able to cross legs, able to put socks and shoes easier, improved joint pain, feel better, less hot flashes, smaller clothing sizes, take the stairs easier  Surgery date: 02/28/2017 Surgery type: Sleeve gastrectomy [facility]: 270.8 lbs Weight today: 239.2 lbs Weight change: 7.2 lbs from 246.4 lbs (03/16/2017) Total weight lost: 31.6 lbs Weight loss goal: ~200 lbs (end of March), <200 lbs (May)   TANITA  BODY COMP RESULTS  03/16/2017 04/28/2017   BMI (kg/m^2) 41 39.8   Fat Mass (lbs) 127.4 107.8   Fat Free Mass (lbs) 119 131.4   Total Body Water (lbs) 87.2 95.4   Pt arrives with son. Pt states she has been walking, running, and dancing around her house as physical activity. Pt states she weighs once a week. Pt is focused on numbers. Pt states she had spinach, turnip greens, and collards; handled well. Pt states she has not tried fresh salad yet. Pt states she tried chili beans (handled ok), biscuits. Pt states she knows to stay away from carbs. Pt states she was approved by surgeon to have half a glass of red wine for New Year's Day; handled fine. Pt states carbonation does not bother her. Pt states she eats very little at time and then fills full.   Preferred Learning Style:   No preference indicated   Learning Readiness:   Contemplating  Ready  Change in progress  24-hr recall: B (AM): 2 oz ham (14g), spoon of eggs (3g) or boiled egg (6g) Snk (AM): none  L (PM): [place]-2 grilled nuggets (6g, 1 waffle fry Snk (PM): none  D (PM): 1-2 oz steak (7-14g), spoon of collards, a few black-eyed peas Snk (PM): 2 oz meat (14g)  Fluid intake: water (64 oz), coffee (4 oz), unsweetened tea (8 oz), crystal light;  64 + oz Estimated total protein intake: ~51g  Medications: See list; no more bp meds or heart meds Supplementation: See list, Procare capsule, Vitamin B12 nasal spray, 4 TUMS   Using straws: no Drinking while eating: sometimes takes a sip Having you been chewing well: yes Chewing/swallowing difficulties: no Changes in vision: no Changes to mood/headaches: no, no Hair loss/Changes to skin/Changes to nails: no, drier skin, no Any difficulty focusing or concentrating: no Sweating: no Dizziness/Lightheaded: no Palpitations: occasionally, pt states its normal may be due to caffeinated Carbonated beverages: yes, sometimes N/V/D/C/GAS: no, no, no, yes, no Abdominal Pain: once with Baby Bell cheese Dumping syndrome: no Last Lap-Band fill: N/A  Recent physical activity:  Walking/running, dancing 3-3.5 miles, 3 days/week  Progress Towards Goal(s):  In progress.  Handouts given during visit include:  Phase 3B: High Protein + NS vegetables   Nutritional Diagnosis:  NI-5.7.1 Inadequate protein intake As related to bariatric surgery post-op recommendations.  As evidenced by pt report of less than 60 grams of protein daily.    Intervention:  Nutrition education and counseling. Goals:  Follow Phase 3B: High Protein + Non-Starchy Vegetables  Eat 3-6 small meals/snacks, every 3-5 hrs  Increase lean protein foods to meet 60g goal  Increase fluid intake to 64oz +  Avoid drinking 15 minutes before, during and 30 minutes after eating  Aim for >30 min of physical activity daily - Track food intake to 60 grams of protein a day.  -  Take three 500-mg calcium supplements a day.  - Contact Dawn [phone] about BELT program  Teaching Method Utilized:  Visual Auditory Hands on  Barriers to learning/adherence to lifestyle change: ability to tolerate more than protein and non-starchy vegetables   Demonstrated degree of understanding via:  Teach Back   Monitoring/Evaluation:  Dietary  intake, exercise, lap band fills, and body weight. Follow up in 2 months for 7 month post-op visit.

## 2017-04-28 NOTE — Patient Instructions (Addendum)
Goals:  Follow Phase 3B: High Protein + Non-Starchy Vegetables  Eat 3-6 small meals/snacks, every 3-5 hrs  Increase lean protein foods to meet 60g goal  Increase fluid intake to 64oz +  Avoid drinking 15 minutes before, during and 30 minutes after eating  Aim for >30 min of physical activity daily  - Track food intake to 60 grams of protein a day.   - Take three 500-mg calcium supplements a day.   - Contact Dawn [phone]

## 2017-05-09 ENCOUNTER — Encounter: Payer: Self-pay | Admitting: Family Medicine

## 2017-05-09 ENCOUNTER — Ambulatory Visit (INDEPENDENT_AMBULATORY_CARE_PROVIDER_SITE_OTHER): Payer: 59 | Admitting: Family Medicine

## 2017-05-09 VITALS — BP 130/76 | HR 63 | Resp 14 | Ht 65.0 in | Wt 233.2 lb

## 2017-05-09 DIAGNOSIS — Z23 Encounter for immunization: Secondary | ICD-10-CM

## 2017-05-09 DIAGNOSIS — F5101 Primary insomnia: Secondary | ICD-10-CM | POA: Diagnosis not present

## 2017-05-09 DIAGNOSIS — R760 Raised antibody titer: Secondary | ICD-10-CM

## 2017-05-09 DIAGNOSIS — M170 Bilateral primary osteoarthritis of knee: Secondary | ICD-10-CM | POA: Diagnosis not present

## 2017-05-09 DIAGNOSIS — F411 Generalized anxiety disorder: Secondary | ICD-10-CM

## 2017-05-09 DIAGNOSIS — K219 Gastro-esophageal reflux disease without esophagitis: Secondary | ICD-10-CM | POA: Diagnosis not present

## 2017-05-09 DIAGNOSIS — R7982 Elevated C-reactive protein (CRP): Secondary | ICD-10-CM

## 2017-05-09 MED ORDER — ALPRAZOLAM 0.5 MG PO TABS: ORAL_TABLET | ORAL | 0 refills | Status: DC

## 2017-05-09 MED ORDER — DICLOFENAC SODIUM 1 % TD GEL: 2.0000 g | Freq: Every evening | TRANSDERMAL | 1 refills | Status: DC | PRN

## 2017-05-09 MED ORDER — ZOLPIDEM TARTRATE 10 MG PO TABS: 5.0000 mg | ORAL_TABLET | Freq: Every evening | ORAL | 0 refills | Status: DC | PRN

## 2017-05-09 NOTE — Progress Notes (Signed)
Name: Sharon Owen   MRN: [id number]    DOB: 1965-11-18   Date:05/09/2017       Progress Note  Subjective  Chief Complaint  Chief Complaint  Patient presents with  . Medication Refill    HPI     Obesity/s/p bariatric surgery : she states she has been obesity since she turned 52 years old. She was in the 160's lbs before she had children. She has tried weight watchers, Adipex, Qsymia ( raised her bp ), Atkins diet. She had sleeve surgery on 02/28/2017 her weight was 270 's, she is doing well post-op, no nausea or vomiting, eating small meals, high protein intake, weight loss has been good, 37 lbs since Nov 2018. She has been off all bp medications, because it was causing dizziness.   Elevated C-reactive protein and lupus anticoagulant: seen by Rheumatologist and has a follow up coming up with Dr. Meda Coffee.   OA both knees: used to take a lot of nsaid's, but is doing well with weight loss, able to walk 3 miles on the track daily and is using topical medication prn only.   Insomnia; she was given Ambien by surgeon on the post-op period, but ran out a few weeks ago and has not been able to sleep since. She has difficulty falling and staying asleep without medication  Anxiety: she usually only uses a few pills of alprazolam per month, but had to take more since Dec to help her sleep while out of Ambien, she needs a refill today  GERD: taking pantoprazole daily and is doing very well, no heartburn or regurgitation    Patient Active Problem List   Diagnosis Date Noted  . S/P laparoscopic sleeve gastrectomy with hiatal hernia repair Nov 2018 02/28/2017  . Hyperlipidemia 06/25/2016  . Metabolic syndrome 29/79/8921  . Lupus anticoagulant positive 11/19/2015  . Elevated C-reactive protein 07/08/2015  . Perimenopause 05/28/2015  . Morbid obesity ([place]) 03/11/2015  . Gastro-esophageal reflux disease without esophagitis 03/11/2015  . Anxiety 03/11/2015  . Insomnia, persistent 03/11/2015   . Osteoarthritis of both knees 03/11/2015  . Episcleritis 03/11/2015  . History of fracture of tibia 05/23/2014  . History of sprain of ankle 05/23/2014  . S/P right knee arthroscopy 12/28/2011  . Medial meniscus, posterior horn derangement 12/01/2011  . Mononeuritis leg 07/17/2011  . Night muscle spasms 07/17/2011    Past Surgical History:  Procedure Laterality Date  . CESAREAN SECTION  1988  . CHONDROPLASTY  12/24/2011   Procedure: CHONDROPLASTY;  Surgeon: Carole Civil, MD;  Location: [facility];  Service: Orthopedics;  Laterality: Right;  . DILATION AND CURETTAGE OF UTERUS     "more than 1"  . FRACTURE SURGERY    . INTRAUTERINE DEVICE INSERTION  ~ 2016  . LAPAROSCOPIC GASTRIC SLEEVE RESECTION N/A 02/28/2017   Procedure: LAPAROSCOPIC GASTRIC SLEEVE RESECTION, UPPER ENDOSCOPY WITH HIATAL HERNIA REPAIR;  Surgeon: Johnathan Hausen, MD;  Location: [facility];  Service: General;  Laterality: N/A;  . TIBIA IM NAIL INSERTION Right 05/23/2014   Procedure: INTRAMEDULLARY (IM) NAIL RIGHT TIBIA;  Surgeon: Alta Corning, MD;  Location: [facility];  Service: Orthopedics;  Laterality: Right;  . VENTRAL HERNIA REPAIR  02/28/2017    Family History  Problem Relation Age of Onset  . Hypertension Mother   . Hypertension Father   . Diabetes Father   . Stroke Father   . Insomnia Sister   . Hypertension Sister   . Diabetes Sister   . Heart failure Maternal Grandfather   .  Diabetes Paternal Grandfather   . Heart attack Paternal Grandfather     Social History   Socioeconomic History  . Marital status: Married    Spouse name: Not on file  . Number of children: Not on file  . Years of education: Not on file  . Highest education level: Not on file  Social Needs  . Financial resource strain: Not on file  . Food insecurity - worry: Not on file  . Food insecurity - inability: Not on file  . Transportation needs - medical: Not on file  . Transportation needs - non-medical: Not on file  Occupational  History  . Not on file  Tobacco Use  . Smoking status: Never Smoker  . Smokeless tobacco: Never Used  Substance and Sexual Activity  . Alcohol use: Yes    Alcohol/week: 2.4 oz    Types: 2 Cans of beer, 2 Shots of liquor per week  . Drug use: No  . Sexual activity: Yes  Other Topics Concern  . Not on file  Social History Narrative   Married, working two jobs ( taking one extra shift to help pay for daughter' college)     Current Outpatient Medications:  .  ALPRAZolam (XANAX) 0.5 MG tablet, TAKE HALF TO ONE TABLET BY MOUTH EVERY 4 HOURS AS NEEDED FOR ANXIETY, Disp: 30 tablet, Rfl: 0 .  cyclobenzaprine (FLEXERIL) 10 MG tablet, Take 1 tablet (10 mg total) by mouth at bedtime., Disp: 90 tablet, Rfl: 0 .  diclofenac sodium (VOLTAREN) 1 % GEL, Apply 2 g topically at bedtime as needed (for pain)., Disp: , Rfl:  .  diphenhydrAMINE (BENADRYL) 25 MG tablet, Take 25 mg by mouth every 6 (six) hours as needed for itching. Reported on 05/28/2015, Disp: , Rfl:  .  Multiple Vitamins-Minerals (MULTIVITAMIN PO), Take 1 tablet by mouth daily., Disp: , Rfl:  .  pantoprazole (PROTONIX) 40 MG tablet, Take 40 mg by mouth daily., Disp: , Rfl: 3 .  zolpidem (AMBIEN) 10 MG tablet, Take 0.5-1 tablets (5-10 mg total) by mouth at bedtime as needed for sleep., Disp: 90 tablet, Rfl: 0  Allergies  Allergen Reactions  . Adhesive [Tape] Itching  . Latex Itching  . Meloxicam Other (See Comments)    Elevated BP No issues if taken PRN   . Morphine And Related Rash     ROS  Constitutional: Negative for fever, positive for weight change.  Respiratory: Negative for cough and shortness of breath.   Cardiovascular: Negative for chest pain or palpitations.  Gastrointestinal: Negative for abdominal pain, no bowel changes.  Musculoskeletal: Negative for gait problem , positive for intermittent right knee joint swelling.  Skin: Negative for rash.  Neurological: Negative for dizziness or headache.  No other specific  complaints in a complete review of systems (except as listed in HPI above).  Objective  Vitals:   05/09/17 1449  BP: 130/76  Pulse: 63  Resp: 14  SpO2: 98%  Weight: 233 lb 3.2 oz (105.8 kg)  Height: _0  (1.651 m)    Body mass index is 38.81 kg/m.  Physical Exam  Constitutional: Patient appears well-developed and well-nourished. Obese No distress.  HEENT: head atraumatic, normocephalic, pupils equal and reactive to light,  neck supple, throat within normal limits Cardiovascular: Normal rate, regular rhythm and normal heart sounds.  No murmur heard. negative for BLE edema. Pulmonary/Chest: Effort normal and breath sounds normal. No respiratory distress. Abdominal: Soft.  There is no tenderness. Psychiatric: Patient has a normal mood and affect.  behavior is normal. Judgment and thought content normal.  Recent Results (from the past 2160 hour(s))  Basic metabolic panel     Status: Abnormal   Collection Time: 02/23/17  1:44 PM  Result Value Ref Range   Sodium 141 135 - 145 mmol/L   Potassium 3.8 3.5 - 5.1 mmol/L   Chloride 105 101 - 111 mmol/L   CO2 29 22 - 32 mmol/L   Glucose, Bld 118 (H) 65 - 99 mg/dL   BUN 15 6 - 20 mg/dL   Creatinine, Ser 0.83 0.44 - 1.00 mg/dL   Calcium 9.0 8.9 - 10.3 mg/dL   GFR calc non Af Amer >60 >60 mL/min   GFR calc Af Amer >60 >60 mL/min    Comment: (NOTE) The eGFR has been calculated using the CKD EPI equation. This calculation has not been validated in all clinical situations. eGFR's persistently <60 mL/min signify possible Chronic Kidney Disease.    Anion gap 7 5 - 15  CBC     Status: None   Collection Time: 02/23/17  1:44 PM  Result Value Ref Range   WBC 8.0 4.0 - 10.5 K/uL   RBC 4.62 3.87 - 5.11 MIL/uL   Hemoglobin 13.5 12.0 - 15.0 g/dL   HCT 41.5 36.0 - 46.0 %   MCV 89.8 78.0 - 100.0 fL   MCH 29.2 26.0 - 34.0 pg   MCHC 32.5 30.0 - 36.0 g/dL   RDW 15.1 11.5 - 15.5 %   Platelets 345 150 - 400 K/uL  hCG, serum, qualitative      Status: None   Collection Time: 02/23/17  1:44 PM  Result Value Ref Range   Preg, Serum NEGATIVE NEGATIVE    Comment:        THE SENSITIVITY OF THIS METHODOLOGY IS >10 mIU/mL.   CBC     Status: Abnormal   Collection Time: 02/28/17 12:38 PM  Result Value Ref Range   WBC 11.3 (H) 4.0 - 10.5 K/uL   RBC 4.60 3.87 - 5.11 MIL/uL   Hemoglobin 13.5 12.0 - 15.0 g/dL   HCT 41.0 36.0 - 46.0 %   MCV 89.1 78.0 - 100.0 fL   MCH 29.3 26.0 - 34.0 pg   MCHC 32.9 30.0 - 36.0 g/dL   RDW 14.8 11.5 - 15.5 %   Platelets 270 150 - 400 K/uL  Creatinine, serum     Status: None   Collection Time: 02/28/17 12:38 PM  Result Value Ref Range   Creatinine, Ser 0.89 0.44 - 1.00 mg/dL   GFR calc non Af Amer >60 >60 mL/min   GFR calc Af Amer >60 >60 mL/min    Comment: (NOTE) The eGFR has been calculated using the CKD EPI equation. This calculation has not been validated in all clinical situations. eGFR's persistently <60 mL/min signify possible Chronic Kidney Disease.   Hemoglobin and hematocrit, blood     Status: None   Collection Time: 02/28/17  2:41 PM  Result Value Ref Range   Hemoglobin 12.8 12.0 - 15.0 g/dL   HCT 39.5 36.0 - 46.0 %  CBC WITH DIFFERENTIAL     Status: None   Collection Time: 03/01/17  6:23 AM  Result Value Ref Range   WBC 9.8 4.0 - 10.5 K/uL   RBC 4.36 3.87 - 5.11 MIL/uL   Hemoglobin 12.6 12.0 - 15.0 g/dL   HCT 38.3 36.0 - 46.0 %   MCV 87.8 78.0 - 100.0 fL   MCH 28.9 26.0 - 34.0 pg  MCHC 32.9 30.0 - 36.0 g/dL   RDW 15.0 11.5 - 15.5 %   Platelets 348 150 - 400 K/uL   Neutrophils Relative % 78 %   Neutro Abs 7.5 1.7 - 7.7 K/uL   Lymphocytes Relative 14 %   Lymphs Abs 1.4 0.7 - 4.0 K/uL   Monocytes Relative 8 %   Monocytes Absolute 0.8 0.1 - 1.0 K/uL   Eosinophils Relative 0 %   Eosinophils Absolute 0.0 0.0 - 0.7 K/uL   Basophils Relative 0 %   Basophils Absolute 0.0 0.0 - 0.1 K/uL      PHQ2/9: Depression screen [facility] 2/9 04/28/2017 02/14/2017 01/10/2017 11/08/2016  07/07/2016  Decreased Interest 0 0 0 0 0  Down, Depressed, Hopeless 0 0 0 0 0  PHQ - 2 Score 0 0 0 0 0     Fall Risk: Fall Risk  05/09/2017 04/28/2017 02/14/2017 01/10/2017 11/08/2016  Falls in the past year? _0   Number falls in past yr: - - - - -  Injury with Fall? - - - - -  Comment - - - - -     Functional Status Survey: Is the patient deaf or have difficulty hearing?: No Does the patient have difficulty seeing, even when wearing glasses/contacts?: No Does the patient have difficulty concentrating, remembering, or making decisions?: No Does the patient have difficulty walking or climbing stairs?: No Does the patient have difficulty dressing or bathing?: No Does the patient have difficulty doing errands alone such as visiting a doctor's office or shopping?: No    Assessment & Plan  1. Primary insomnia  - zolpidem (AMBIEN) 10 MG tablet; Take 0.5-1 tablets (5-10 mg total) by mouth at bedtime as needed for sleep.  Dispense: 90 tablet; Refill: 0  2. Morbid obesity, unspecified obesity type [facility])  S/p bariatric surgery and is doing well  3. Lupus anticoagulant positive  Keep follow up with rheumatologist   4. Elevated C-reactive protein   5. GAD (generalized anxiety disorder)  - ALPRAZolam (XANAX) 0.5 MG tablet; TAKE HALF TO ONE TABLET BY MOUTH EVERY 4 HOURS AS NEEDED FOR ANXIETY  Dispense: 30 tablet; Refill: 0  6. Gastro-esophageal reflux disease without esophagitis  Continue PPI   7. Primary osteoarthritis of both knees  Continue topical medication

## 2017-05-18 ENCOUNTER — Telehealth: Payer: Self-pay | Admitting: Registered"

## 2017-05-18 ENCOUNTER — Telehealth: Payer: Self-pay | Admitting: Skilled Nursing Facility1

## 2017-05-18 NOTE — Telephone Encounter (Signed)
Pt wanted to know if she could have nuts.  Dietitian advised she stick to one serving size a day.

## 2017-05-18 NOTE — Telephone Encounter (Signed)
RD returned patient's phone call and left voicemail.

## 2017-05-23 ENCOUNTER — Ambulatory Visit: Payer: BLUE CROSS/BLUE SHIELD | Admitting: Family Medicine

## 2017-09-06 ENCOUNTER — Ambulatory Visit: Payer: Self-pay

## 2017-10-14 ENCOUNTER — Encounter: Payer: 59 | Admitting: Obstetrics and Gynecology

## 2017-11-14 ENCOUNTER — Other Ambulatory Visit: Payer: Self-pay | Admitting: Family Medicine

## 2017-11-14 DIAGNOSIS — F5101 Primary insomnia: Secondary | ICD-10-CM

## 2017-11-14 NOTE — Telephone Encounter (Signed)
Refill request was sent to Dr. Krichna Sowles for approval and submission.  

## 2017-11-14 NOTE — Telephone Encounter (Signed)
Copied from CRM [id number]. Topic: Quick Communication - Rx Refill/Question >> Nov 14, 2017 11:03 AM Jay SchlichterWeikart, Melissa J wrote: Medication:  zolpidem (AMBIEN) 10 MG tablet Has the patient contacted their pharmacy? No. (Agent: If no, request that the patient contact the pharmacy for the refill.) (Agent: If yes, when and what did the pharmacy advise?)  Preferred Pharmacy (with phone number [facility] street name[facility] [place]  Pt has 1 left   Agent: Please be advised that RX refills may take up to 3 business days. We ask that you follow-up with your pharmacy.

## 2017-12-02 ENCOUNTER — Encounter: Payer: Self-pay | Admitting: Family Medicine

## 2017-12-02 ENCOUNTER — Ambulatory Visit: Payer: 59 | Admitting: Family Medicine

## 2017-12-02 VITALS — BP 122/70 | HR 86 | Temp 98.1°F | Resp 16 | Ht 65.0 in | Wt 210.2 lb

## 2017-12-02 DIAGNOSIS — Z1211 Encounter for screening for malignant neoplasm of colon: Secondary | ICD-10-CM | POA: Diagnosis not present

## 2017-12-02 DIAGNOSIS — F411 Generalized anxiety disorder: Secondary | ICD-10-CM

## 2017-12-02 DIAGNOSIS — R760 Raised antibody titer: Secondary | ICD-10-CM

## 2017-12-02 DIAGNOSIS — F5101 Primary insomnia: Secondary | ICD-10-CM

## 2017-12-02 MED ORDER — ZOLPIDEM TARTRATE 10 MG PO TABS: 5.0000 mg | ORAL_TABLET | Freq: Every evening | ORAL | 0 refills | Status: DC | PRN

## 2017-12-02 MED ORDER — ALPRAZOLAM 0.5 MG PO TABS: ORAL_TABLET | ORAL | 0 refills | Status: DC

## 2017-12-02 NOTE — Progress Notes (Signed)
Name: Sharon Owen   MRN: [id number]    DOB: 19-Jul-1965   Date:12/02/2017       Progress Note  Subjective  Chief Complaint  Chief Complaint  Patient presents with  . Insomnia    follow up medication refill    HPI   Obesity/s/p bariatric surgery : she states she has been obesity since she turned 52 years old. She was in the 160's lbs before she had children. She has tried weight watchers, Adipex, Qsymia ( raised her bp ), Atkins diet. She had sleeve surgery on 02/28/2017 her weight was 270 's, she is down 60 lbs since. `  Elevated C-reactive protein and lupus anticoagulant: seen by Rheumatologist and has a follow up coming up with Dr. Renard Matter. She states she was given reassurance at the time and is currently feeling well.   Insomnia; she has been taking medication prn , lasting over 6 months, but she needs a refill now. She denies side effects of medication   Anxiety: she usually only uses a few pills of alprazolam per month, she still has some left but would like a refill to keep on file.    Patient Active Problem List   Diagnosis Date Noted  . S/P laparoscopic sleeve gastrectomy with hiatal hernia repair Nov 2018 02/28/2017  . Hyperlipidemia 06/25/2016  . Metabolic syndrome 11/19/2015  . Lupus anticoagulant positive 11/19/2015  . Elevated C-reactive protein 07/08/2015  . Perimenopause 05/28/2015  . Morbid obesity (HCC) 03/11/2015  . Gastro-esophageal reflux disease without esophagitis 03/11/2015  . Anxiety 03/11/2015  . Insomnia, persistent 03/11/2015  . Osteoarthritis of both knees 03/11/2015  . Episcleritis 03/11/2015  . History of fracture of tibia 05/23/2014  . History of sprain of ankle 05/23/2014  . S/P right knee arthroscopy 12/28/2011  . Medial meniscus, posterior horn derangement 12/01/2011  . Mononeuritis leg 07/17/2011  . Night muscle spasms 07/17/2011    Past Surgical History:  Procedure Laterality Date  . CESAREAN SECTION  1988  . CHONDROPLASTY   12/24/2011   Procedure: CHONDROPLASTY;  Surgeon: Vickki Hearing, MD;  Location: [facility];  Service: Orthopedics;  Laterality: Right;  . DILATION AND CURETTAGE OF UTERUS     "more than 1"  . FRACTURE SURGERY    . INTRAUTERINE DEVICE INSERTION  ~ 2016  . LAPAROSCOPIC GASTRIC SLEEVE RESECTION N/A 02/28/2017   Procedure: LAPAROSCOPIC GASTRIC SLEEVE RESECTION, UPPER ENDOSCOPY WITH HIATAL HERNIA REPAIR;  Surgeon: Luretha Murphy, MD;  Location: [facility];  Service: General;  Laterality: N/A;  . TIBIA IM NAIL INSERTION Right 05/23/2014   Procedure: INTRAMEDULLARY (IM) NAIL RIGHT TIBIA;  Surgeon: Harvie Junior, MD;  Location: [facility];  Service: Orthopedics;  Laterality: Right;  . VENTRAL HERNIA REPAIR  02/28/2017    Family History  Problem Relation Age of Onset  . Hypertension Mother   . Hypertension Father   . Diabetes Father   . Stroke Father   . Insomnia Sister   . Hypertension Sister   . Diabetes Sister   . Heart failure Maternal Grandfather   . Diabetes Paternal Grandfather   . Heart attack Paternal Grandfather     Social History   Socioeconomic History  . Marital status: Married    Spouse name: Not on file  . Number of children: Not on file  . Years of education: Not on file  . Highest education level: Not on file  Occupational History  . Not on file  Social Needs  . Financial resource strain: Not on file  .  Food insecurity:    Worry: Not on file    Inability: Not on file  . Transportation needs:    Medical: Not on file    Non-medical: Not on file  Tobacco Use  . Smoking status: Never Smoker  . Smokeless tobacco: Never Used  Substance and Sexual Activity  . Alcohol use: Yes    Alcohol/week: 4.0 standard drinks    Types: 2 Cans of beer, 2 Shots of liquor per week  . Drug use: No  . Sexual activity: Yes  Lifestyle  . Physical activity:    Days per week: Not on file    Minutes per session: Not on file  . Stress: Not on file  Relationships  . Social connections:     Talks on phone: Not on file    Gets together: Not on file    Attends religious service: Not on file    Active member of club or organization: Not on file    Attends meetings of clubs or organizations: Not on file    Relationship status: Not on file  . Intimate partner violence:    Fear of current or ex partner: Not on file    Emotionally abused: Not on file    Physically abused: Not on file    Forced sexual activity: Not on file  Other Topics Concern  . Not on file  Social History Narrative   Married, working two jobs ( taking one extra shift to help pay for daughter' college)     Current Outpatient Medications:  .  ALPRAZolam (XANAX) 0.5 MG tablet, TAKE HALF TO ONE TABLET BY MOUTH EVERY 4 HOURS AS NEEDED FOR ANXIETY, Disp: 30 tablet, Rfl: 0 .  cyclobenzaprine (FLEXERIL) 10 MG tablet, Take 1 tablet (10 mg total) by mouth at bedtime., Disp: 90 tablet, Rfl: 0 .  diclofenac sodium (VOLTAREN) 1 % GEL, Apply 2 g topically at bedtime as needed (for pain)., Disp: 100 g, Rfl: 1 .  diphenhydrAMINE (BENADRYL) 25 MG tablet, Take 25 mg by mouth every 6 (six) hours as needed for itching. Reported on 05/28/2015, Disp: , Rfl:  .  Multiple Vitamins-Minerals (MULTIVITAMIN PO), Take 1 tablet by mouth daily., Disp: , Rfl:  .  pantoprazole (PROTONIX) 40 MG tablet, Take 40 mg by mouth daily., Disp: , Rfl: 3 .  zolpidem (AMBIEN) 10 MG tablet, Take 0.5-1 tablets (5-10 mg total) by mouth at bedtime as needed for sleep., Disp: 90 tablet, Rfl: 0  Allergies  Allergen Reactions  . Adhesive [Tape] Itching  . Latex Itching  . Meloxicam Other (See Comments)    Elevated BP No issues if taken PRN   . Morphine And Related Rash     ROS  Constitutional: Negative for fever , positive for weight change.  Respiratory: Negative for cough and shortness of breath.   Cardiovascular: Negative for chest pain or palpitations.  Gastrointestinal: Negative for abdominal pain, no bowel changes.  Musculoskeletal: Negative  for gait problem or joint swelling.  Skin: Negative for rash.  Neurological: Negative for dizziness or headache.  No other specific complaints in a complete review of systems (except as listed in HPI above).  Objective  Vitals:   12/02/17 1056  BP: 122/70  Pulse: 86  Resp: 16  Temp: 98.1 F (36.7 C)  TempSrc: Oral  SpO2: 94%  Weight: 210 lb 3.2 oz (95.3 kg)  Height: 5\' 5"  (1.651 m)    Body mass index is 34.98 kg/m.  Physical Exam  Constitutional: Patient appears well-developed  and well-nourished. Obese No distress.  HEENT: head atraumatic, normocephalic, pupils equal and reactive to light, neck supple, throat within normal limits Cardiovascular: Normal rate, regular rhythm and normal heart sounds.  No murmur heard. No BLE edema. Pulmonary/Chest: Effort normal and breath sounds normal. No respiratory distress. Abdominal: Soft.  There is no tenderness. Psychiatric: Patient has a normal mood and affect. behavior is normal. Judgment and thought content normal.  PHQ2/9: Depression screen [facility] 2/9 12/02/2017 04/28/2017 02/14/2017 01/10/2017 11/08/2016  Decreased Interest 0 0 0 0 0  Down, Depressed, Hopeless 0 0 0 0 0  PHQ - 2 Score 0 0 0 0 0  Altered sleeping 0 - - - -  Tired, decreased energy 0 - - - -  Change in appetite 0 - - - -  Feeling bad or failure about yourself  0 - - - -  Trouble concentrating 0 - - - -  Moving slowly or fidgety/restless 0 - - - -  Suicidal thoughts 0 - - - -  PHQ-9 Score 0 - - - -  Difficult doing work/chores Not difficult at all - - - -     Fall Risk: Fall Risk  12/02/2017 05/09/2017 04/28/2017 02/14/2017 01/10/2017  Falls in the past year? No No No No No  Number falls in past yr: - - - - -  Injury with Fall? - - - - -  Comment - - - - -     Assessment & Plan  1. Primary insomnia  - zolpidem (AMBIEN) 10 MG tablet; Take 0.5-1 tablets (5-10 mg total) by mouth at bedtime as needed for sleep.  Dispense: 90 tablet; Refill: 0  2. Colon cancer  screening  - Cologuard  3. GAD (generalized anxiety disorder)  - ALPRAZolam (XANAX) 0.5 MG tablet; TAKE HALF TO ONE TABLET BY MOUTH EVERY 4 HOURS AS NEEDED FOR ANXIETY  Dispense: 30 tablet; Refill: 0  4. Lupus anticoagulant positive  Seen by rheumatologist

## 2017-12-12 IMAGING — DX DG CHEST 2V
2 series · 2 of 2 positions shown · non-contrast
Comparison: 03/02/2015

CLINICAL DATA: Hypertensive and tingling in the left side of the
face this morning.

EXAM:
CHEST  2 VIEW

[w chest pa]
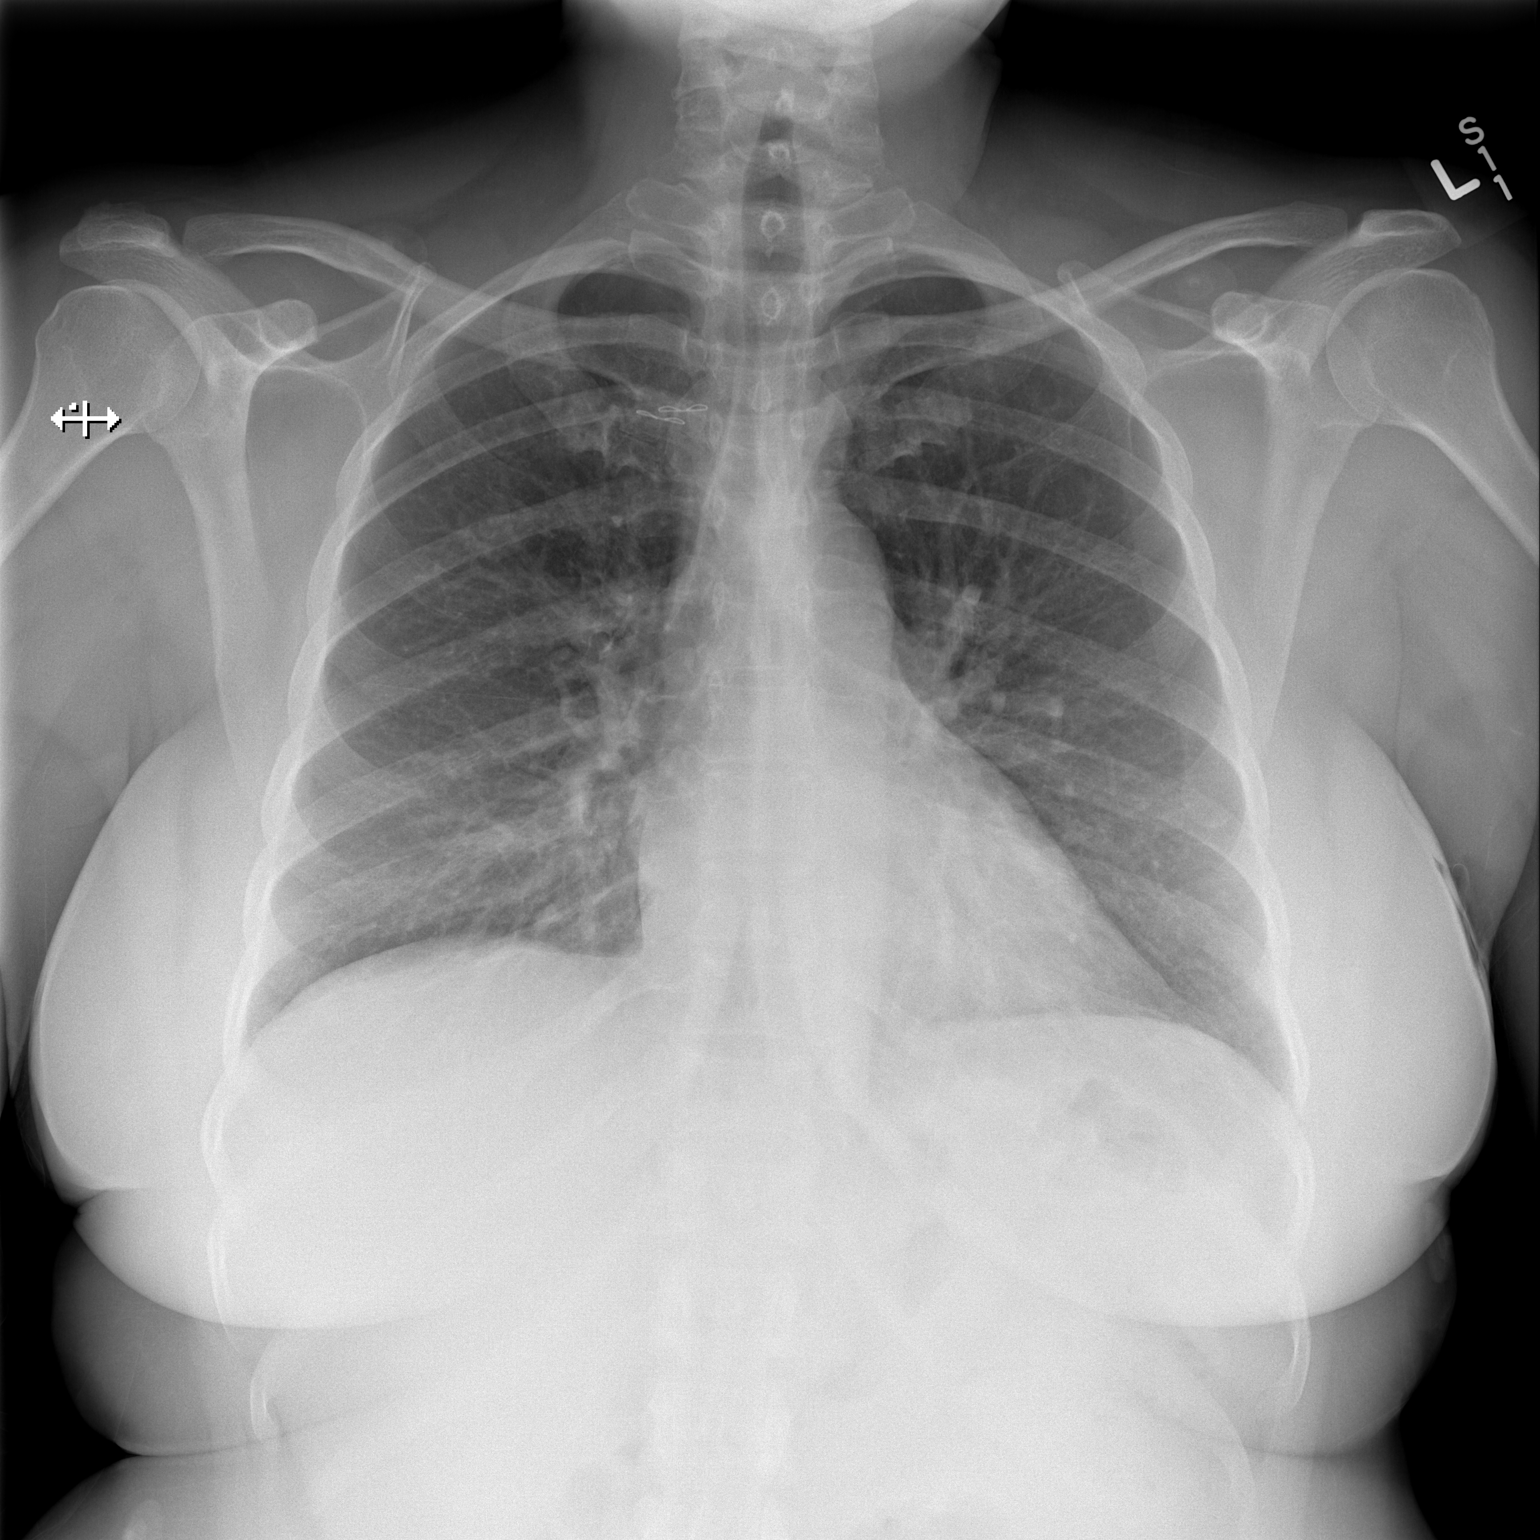

[w chest lat]
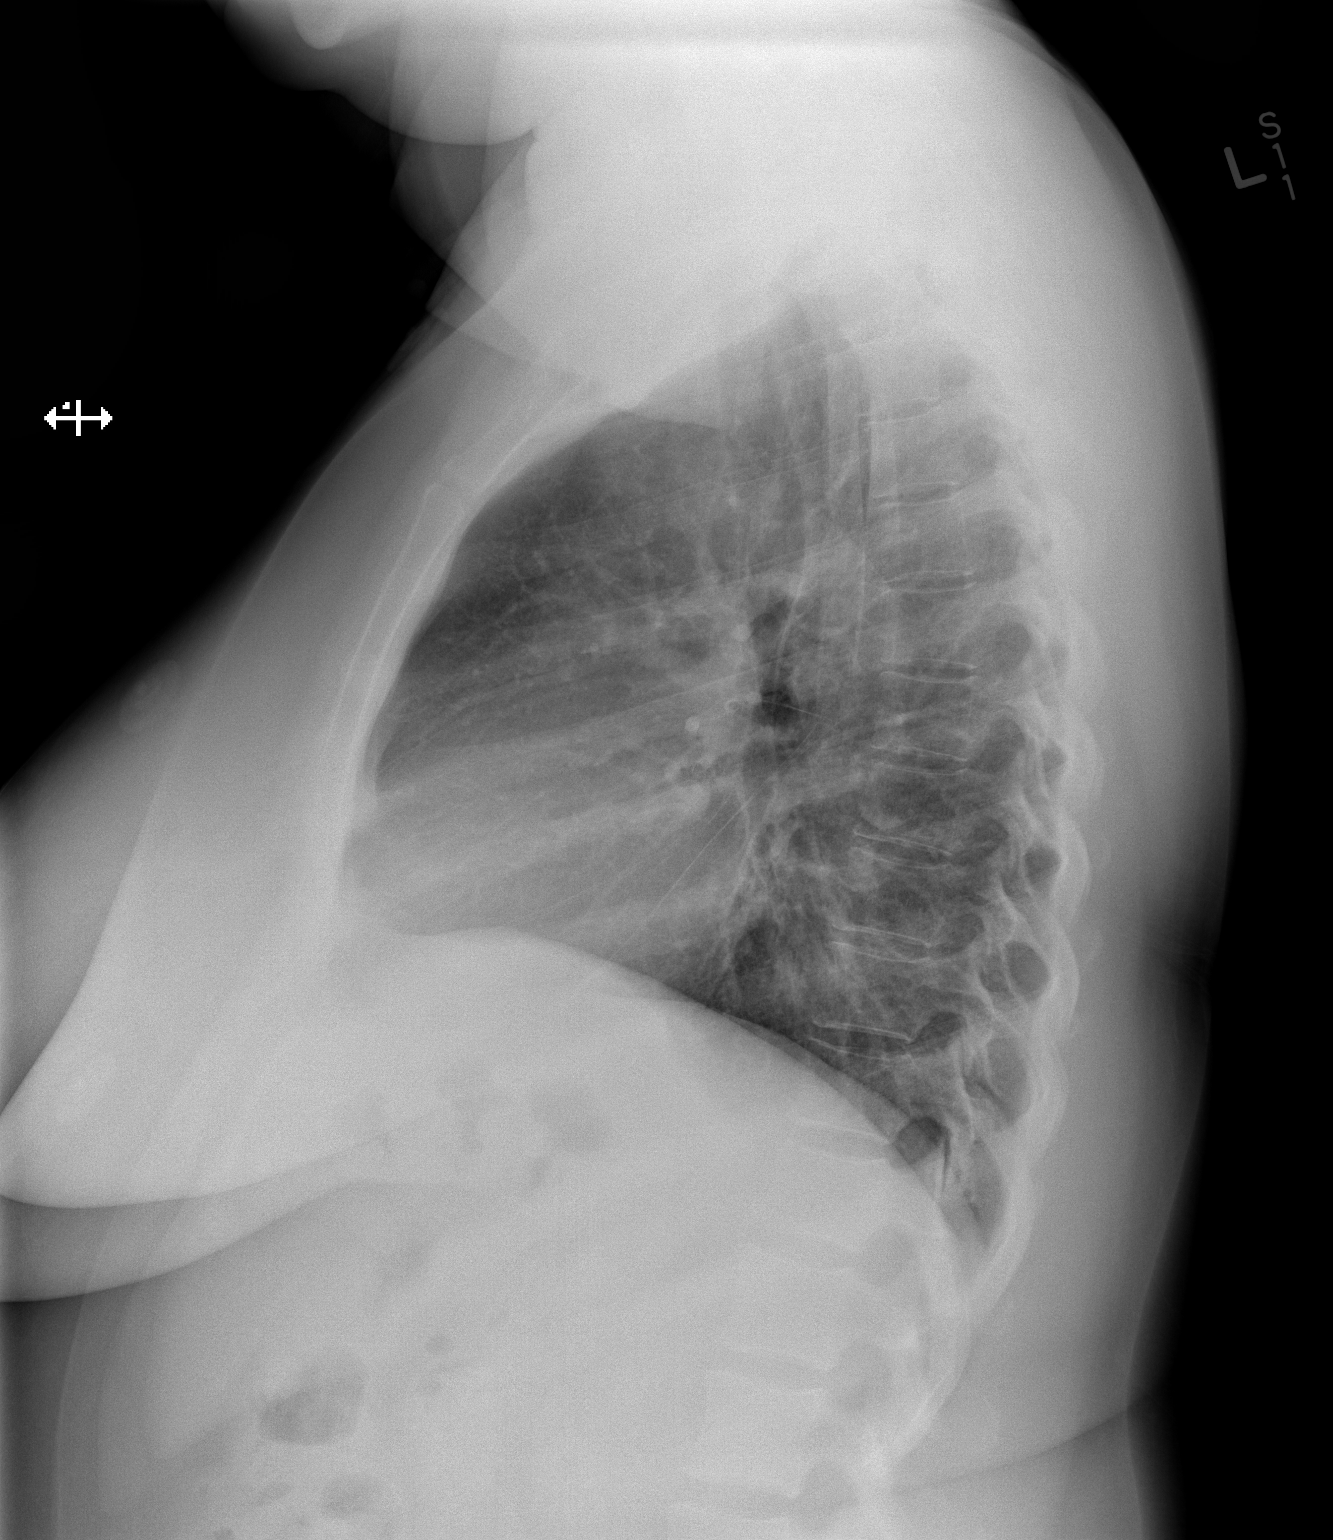

[2 of 2 positions shown; findings below may reference images not displayed]

FINDINGS: The heart size and mediastinal contours are within normal limits.
Both lungs are clear. The visualized skeletal structures are
unremarkable. Metallic artifact overlying the right upper chest.
IMPRESSION: Normal chest x-ray.

## 2017-12-12 IMAGING — CT CT HEAD W/O CM
1 series · 16 of 30 positions shown, 20 images · non-contrast
Comparison: CT brain scan of 08/15/2009

CLINICAL DATA: Headache, dizziness, hypertension

EXAM:
CT HEAD WITHOUT CONTRAST
TECHNIQUE: Contiguous axial images were obtained from the base of the skull
through the vertex without intravenous contrast.

[Series 2: head wo · axial · 0.42mm/px · z∈[-86,+40]mm · 16 of 32 slices shown, 20 images]
[im 2/32  brain]
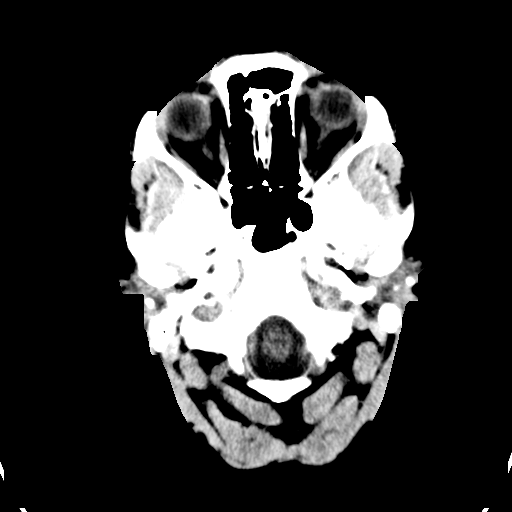
[im 2/32  bone]
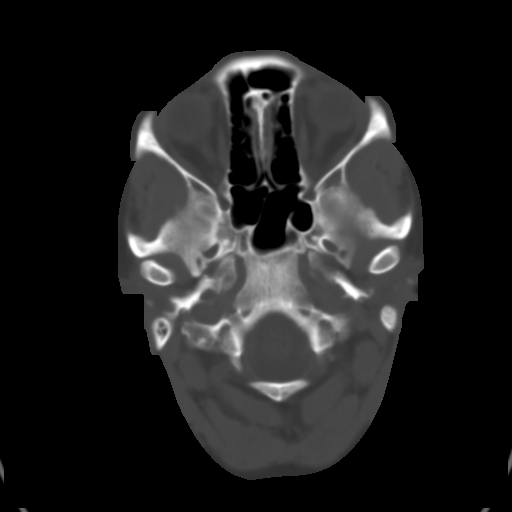
[im 4/32  brain]
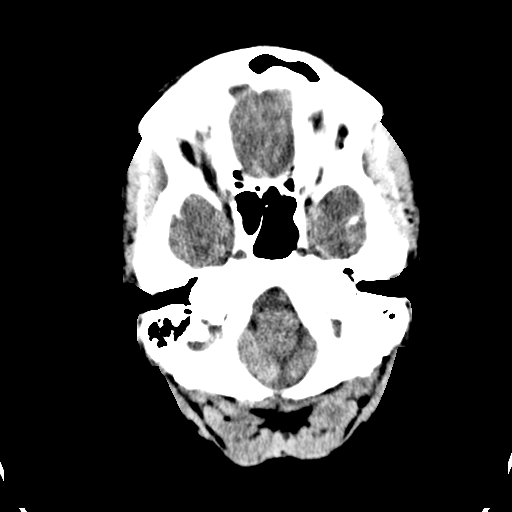
[im 6/32  brain]
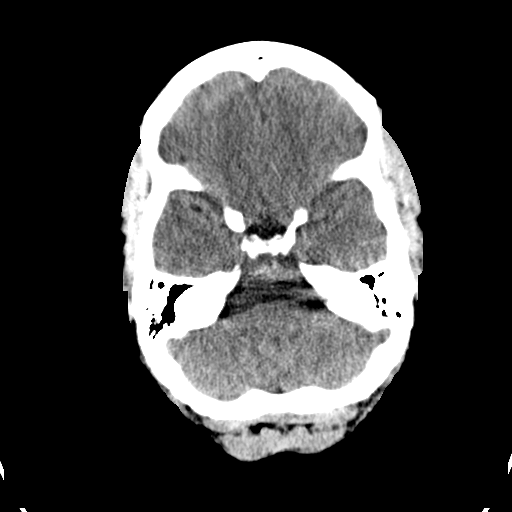
[im 8/32  brain]
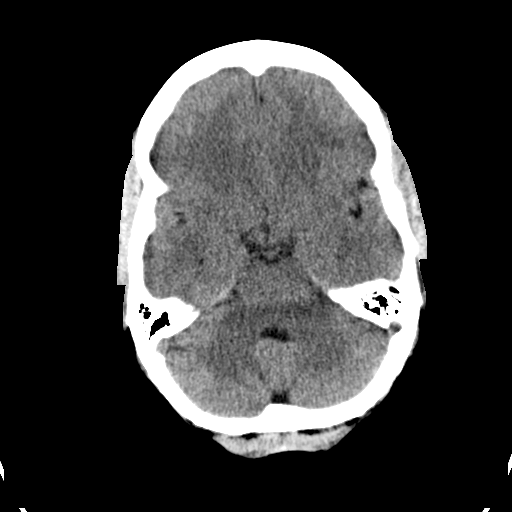
[im 9/32  brain]
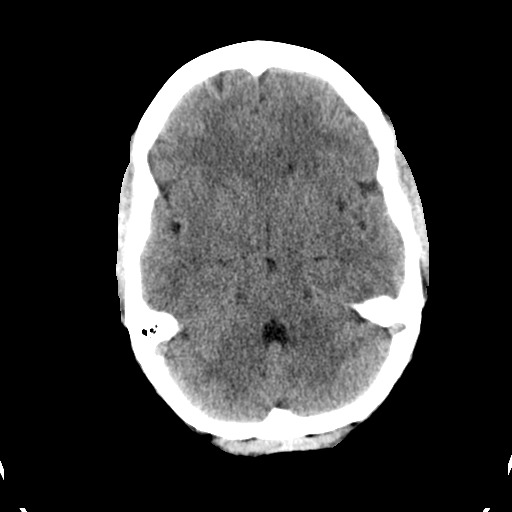
[im 9/32  bone]
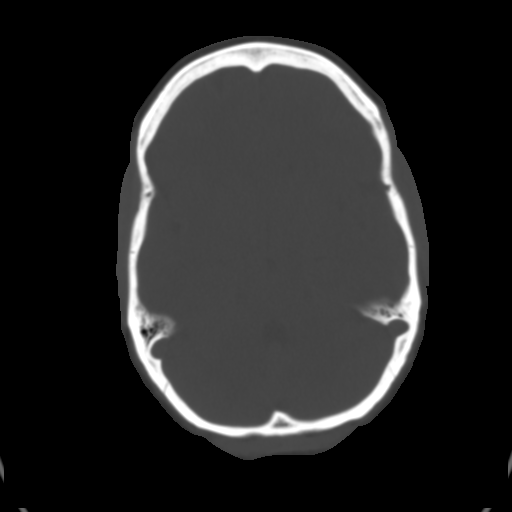
[im 11/32  brain]
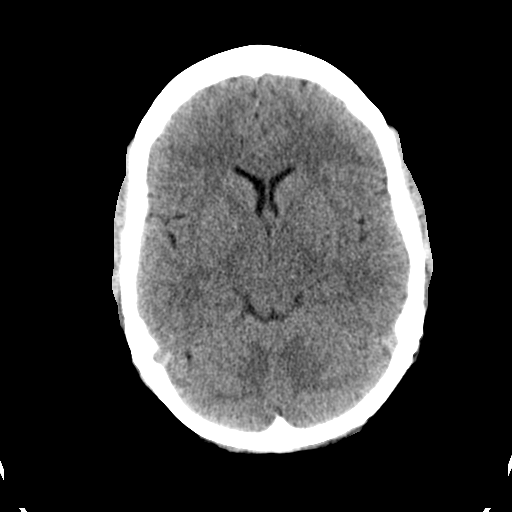
[im 13/32  brain]
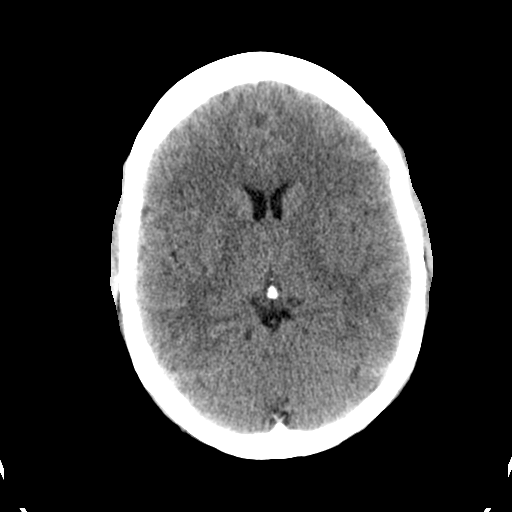
[im 15/32  brain]
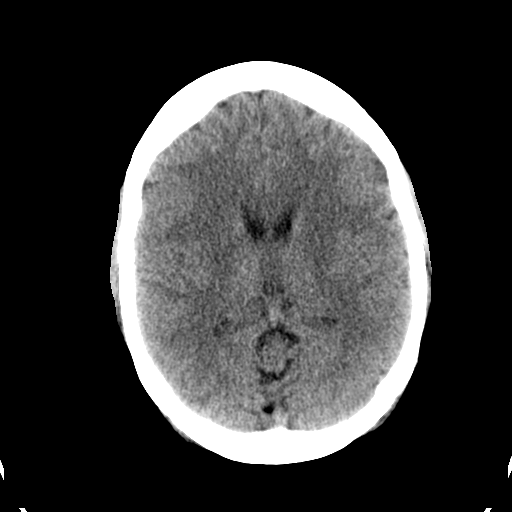
[im 17/32  brain]
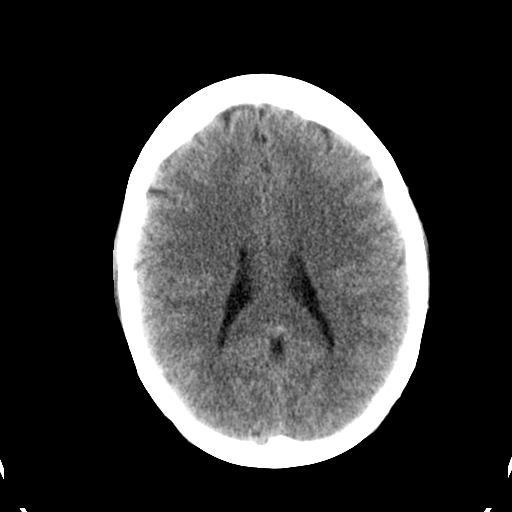
[im 17/32  bone]
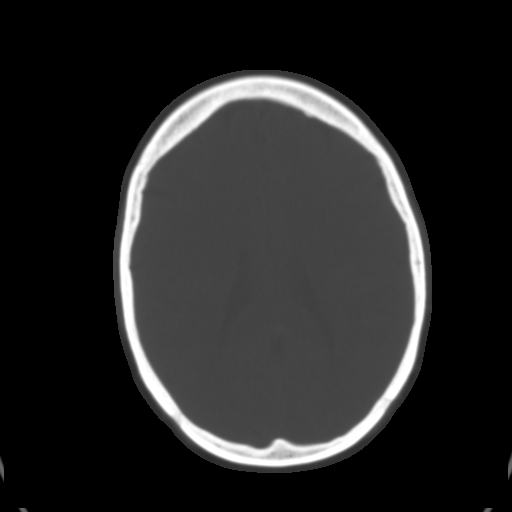
[im 19/32  brain]
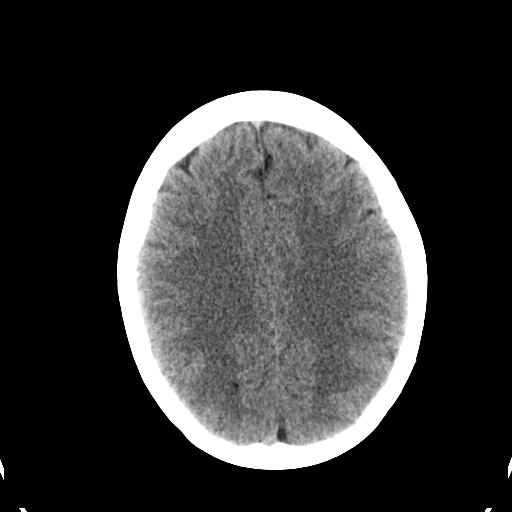
[im 21/32  brain]
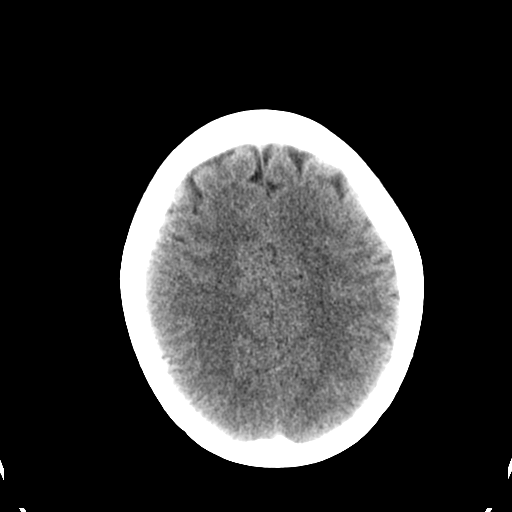
[im 23/32  brain]
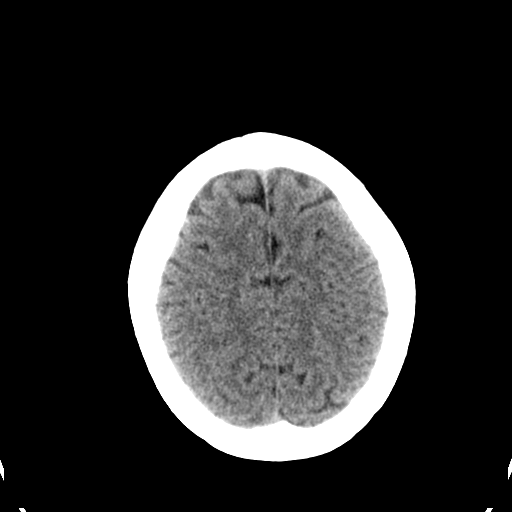
[im 24/32  brain]
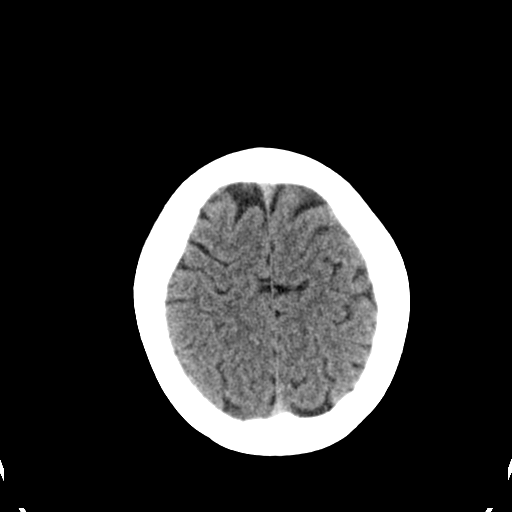
[im 24/32  bone]
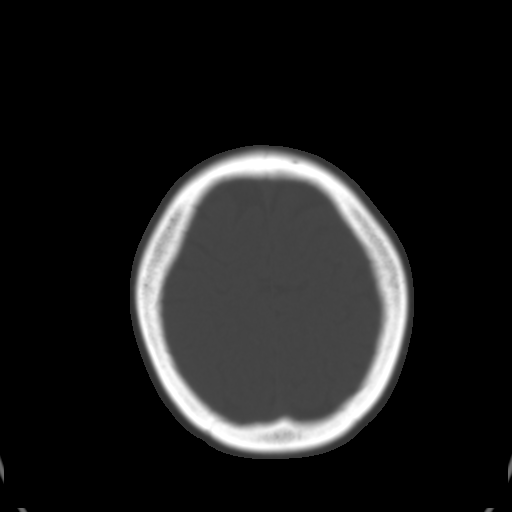
[im 26/32  brain]
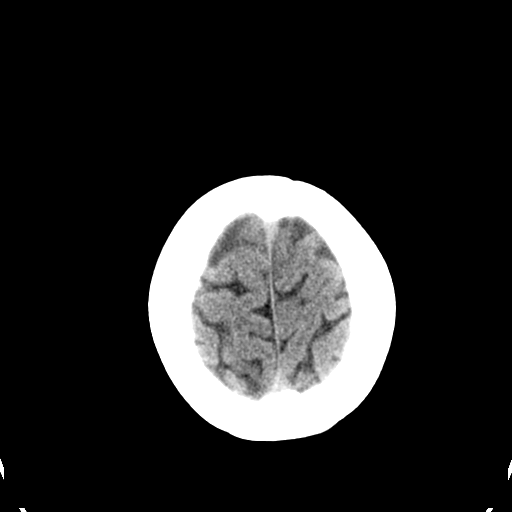
[im 28/32  brain]
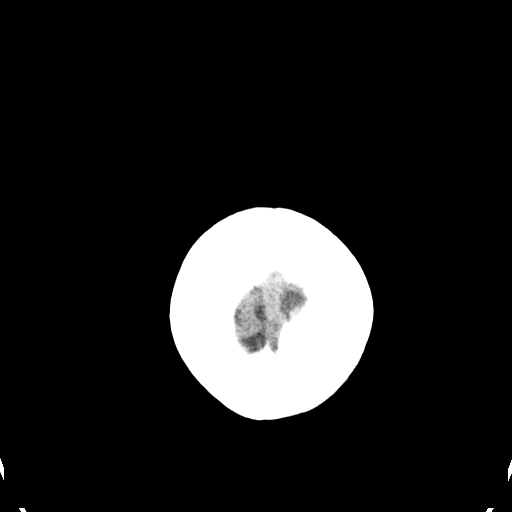
[im 30/32  brain]
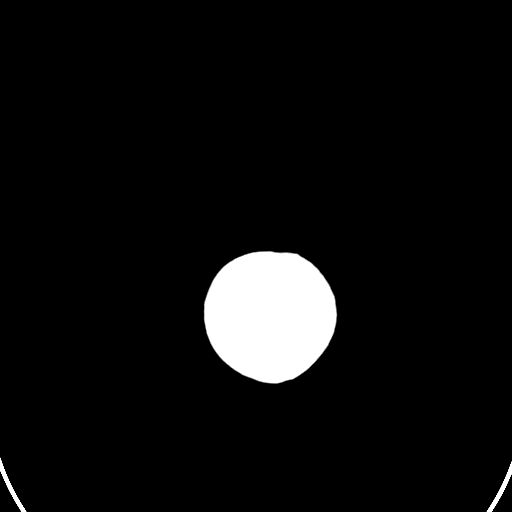

[16 of 30 positions shown; findings below may reference images not displayed]

FINDINGS: The ventricular system is normal in size and configuration and the
septum remains midline in position. The fourth ventricle and basilar
cisterns are unremarkable. No hemorrhage, mass lesion, or acute
infarction is seen. On bone window images, the paranasal sinuses
that are visualized are well pneumatized. No calvarial abnormality
is seen.
IMPRESSION: Negative unenhanced CT of the brain.

## 2018-01-05 ENCOUNTER — Encounter: Payer: Self-pay | Admitting: Obstetrics and Gynecology

## 2018-01-05 ENCOUNTER — Ambulatory Visit (INDEPENDENT_AMBULATORY_CARE_PROVIDER_SITE_OTHER): Payer: 59 | Admitting: Obstetrics and Gynecology

## 2018-01-05 VITALS — BP 135/77 | HR 69 | Ht 64.0 in | Wt 210.9 lb

## 2018-01-05 DIAGNOSIS — Z01419 Encounter for gynecological examination (general) (routine) without abnormal findings: Secondary | ICD-10-CM | POA: Diagnosis not present

## 2018-01-05 DIAGNOSIS — E669 Obesity, unspecified: Secondary | ICD-10-CM

## 2018-01-05 DIAGNOSIS — R300 Dysuria: Secondary | ICD-10-CM | POA: Diagnosis not present

## 2018-01-05 LAB — POCT URINALYSIS DIPSTICK
Bilirubin, UA: NEGATIVE
Blood, UA: NEGATIVE
Glucose, UA: NEGATIVE
Ketones, UA: NEGATIVE
Leukocytes, UA: NEGATIVE
Nitrite, UA: NEGATIVE
Protein, UA: NEGATIVE
Spec Grav, UA: 1.01 (ref 1.010–1.025)
Urobilinogen, UA: 0.2 E.U./dL
pH, UA: 6 (ref 5.0–8.0)

## 2018-01-05 MED ORDER — PHENTERMINE HCL 37.5 MG PO CAPS: 37.5000 mg | ORAL_CAPSULE | ORAL | 2 refills | Status: DC

## 2018-01-05 NOTE — Progress Notes (Signed)
Subjective:   Sharon Owen is a 52 y.o. 706P0020 African American female here for a routine well-woman exam.  No LMP recorded. (Menstrual status: IUD).    Current complaints: has lost 58#s since last visit, desires restart adipex to help get last 22#s off. Is exercising regularly. Still having daily hot flashes. PCP: Sowles       does desire labs  Social History: Sexual: heterosexual Marital Status: married Living situation: with spouse Occupation: unknown occupation Tobacco/alcohol: no tobacco use Illicit drugs: no history of illicit drug use  The following portions of the patient's history were reviewed and updated as appropriate: allergies, current medications, past family history, past medical history, past social history, past surgical history and problem list.  Past Medical History Past Medical History:  Diagnosis Date  . Anxiety   . Chronic osteoarthritis    "knees" (06/24/2016)  . Complication of anesthesia    "I'm hard to wake up" (06/24/2016)  . Elevated blood pressure   . Episclerotitis, right    "of eye"  . GERD (gastroesophageal reflux disease)   . Headache    "monthly at least" (06/24/2016)  . Hypertension   . Insomnia   . Metabolic syndrome   . Morbid obesity [facility](HCC)     Past Surgical History Past Surgical History:  Procedure Laterality Date  . CESAREAN SECTION  1988  . CHONDROPLASTY  12/24/2011   Procedure: CHONDROPLASTY;  Surgeon: Vickki HearingStanley E Harrison, MD;  Location: [facility];  Service: Orthopedics;  Laterality: Right;  . DILATION AND CURETTAGE OF UTERUS     "more than 1"  . FRACTURE SURGERY    . INTRAUTERINE DEVICE INSERTION  ~ 2016  . LAPAROSCOPIC GASTRIC SLEEVE RESECTION N/A 02/28/2017   Procedure: LAPAROSCOPIC GASTRIC SLEEVE RESECTION, UPPER ENDOSCOPY WITH HIATAL HERNIA REPAIR;  Surgeon: Luretha MurphyMartin, Matthew, MD;  Location: [facility];  Service: General;  Laterality: N/A;  . TIBIA IM NAIL INSERTION Right 05/23/2014   Procedure: INTRAMEDULLARY (IM) NAIL RIGHT TIBIA;   Surgeon: Harvie JuniorJohn L Graves, MD;  Location: [facility];  Service: Orthopedics;  Laterality: Right;  . VENTRAL HERNIA REPAIR  02/28/2017    Gynecologic History [id number]  No LMP recorded. (Menstrual status: IUD). Contraception: IUD Last Pap: 2018. Results were: normal Last mammogram: 06/2016. Results were: normal   Obstetric History OB History  Gravida Para Term Preterm AB Living  6 4     2     SAB TAB Ectopic Multiple Live Births  2            # Outcome Date GA Lbr Len/2nd Weight Sex Delivery Anes PTL Lv  6 Para 2000    F Vag-Spont     5 SAB 1999          4 Para 1997    F Vag-Spont     3 SAB 1996          2 Para 1990    F Vag-Spont     1 Para 1988    M CS-Unspec       Current Medications Current Outpatient Medications on File Prior to Visit  Medication Sig Dispense Refill  . ALPRAZolam (XANAX) 0.5 MG tablet TAKE HALF TO ONE TABLET BY MOUTH EVERY 4 HOURS AS NEEDED FOR ANXIETY 30 tablet 0  . cyclobenzaprine (FLEXERIL) 10 MG tablet Take 1 tablet (10 mg total) by mouth at bedtime. 90 tablet 0  . diclofenac sodium (VOLTAREN) 1 % GEL Apply 2 g topically at bedtime as needed (for pain). 100 g 1  .  diphenhydrAMINE (BENADRYL) 25 MG tablet Take 25 mg by mouth every 6 (six) hours as needed for itching. Reported on 05/28/2015    . Multiple Vitamins-Minerals (MULTIVITAMIN PO) Take 1 tablet by mouth daily.    . pantoprazole (PROTONIX) 40 MG tablet Take 40 mg by mouth daily.  3  . zolpidem (AMBIEN) 10 MG tablet Take 0.5-1 tablets (5-10 mg total) by mouth at bedtime as needed for sleep. 90 tablet 0   No current facility-administered medications on file prior to visit.     Review of Systems Patient denies any headaches, blurred vision, shortness of breath, chest pain, abdominal pain, problems with bowel movements, urination, or intercourse.  Objective:  BP 135/77   Pulse 69   Ht 5\' 4"  (1.626 m)   Wt 210 lb 14.4 oz (95.7 kg)   BMI 36.20 kg/m  Physical Exam  General:  Well developed, well  nourished, no acute distress. She is alert and oriented x3. Skin:  Warm and dry Neck:  Midline trachea, no thyromegaly or nodules Cardiovascular: Regular rate and rhythm, no murmur heard Lungs:  Effort normal, all lung fields clear to auscultation bilaterally Breasts:  No dominant palpable mass, retraction, or nipple discharge Abdomen:  Soft, non tender, no hepatosplenomegaly or masses Pelvic:  External genitalia is normal in appearance.  The vagina is normal in appearance. The cervix is bulbous, no CMT. IUD string. Thin prep pap is not done . Uterus is felt to be normal size, shape, and contour.  No adnexal masses or tenderness noted. Extremities:  No swelling or varicosities noted Psych:  She has a normal mood and affect  Assessment:   Healthy well-woman exam Obesity Hot flashes  Plan:  Started on phentermine.  F/U 1 year for AE, or sooner if needed Mammogram needed   Layton Naves Suzan Nailer, CNM

## 2018-01-06 ENCOUNTER — Encounter: Payer: 59 | Admitting: Obstetrics and Gynecology

## 2018-01-06 LAB — COMPREHENSIVE METABOLIC PANEL
ALT: 10 IU/L (ref 0–32)
AST: 16 IU/L (ref 0–40)
Albumin/Globulin Ratio: 1.7 (ref 1.2–2.2)
Albumin: 4.1 g/dL (ref 3.5–5.5)
Alkaline Phosphatase: 86 IU/L (ref 39–117)
BUN/Creatinine Ratio: 17 (ref 9–23)
BUN: 13 mg/dL (ref 6–24)
Bilirubin Total: 0.9 mg/dL (ref 0.0–1.2)
CO2: 25 mmol/L (ref 20–29)
Calcium: 9.3 mg/dL (ref 8.7–10.2)
Chloride: 103 mmol/L (ref 96–106)
Creatinine, Ser: 0.78 mg/dL (ref 0.57–1.00)
GFR calc Af Amer: 101 mL/min/{1.73_m2} (ref >59)
GFR calc non Af Amer: 88 mL/min/{1.73_m2} (ref >59)
Globulin, Total: 2.4 g/dL (ref 1.5–4.5)
Glucose: 83 mg/dL (ref 65–99)
Potassium: 4.2 mmol/L (ref 3.5–5.2)
Sodium: 141 mmol/L (ref 134–144)
Total Protein: 6.5 g/dL (ref 6.0–8.5)

## 2018-01-06 LAB — LIPID PANEL
Chol/HDL Ratio: 3.1 ratio (ref 0.0–4.4)
Cholesterol, Total: 187 mg/dL (ref 100–199)
HDL: 60 mg/dL (ref >39)
LDL Calculated: 110 mg/dL — ABNORMAL HIGH (ref 0–99)
Triglycerides: 85 mg/dL (ref 0–149)
VLDL Cholesterol Cal: 17 mg/dL (ref 5–40)

## 2018-01-06 LAB — CBC
Hematocrit: 40.7 % (ref 34.0–46.6)
Hemoglobin: 13.2 g/dL (ref 11.1–15.9)
MCH: 29.9 pg (ref 26.6–33.0)
MCHC: 32.4 g/dL (ref 31.5–35.7)
MCV: 92 fL (ref 79–97)
Platelets: 297 10*3/uL (ref 150–450)
RBC: 4.41 x10E6/uL (ref 3.77–5.28)
RDW: 14 % (ref 12.3–15.4)
WBC: 6.3 10*3/uL (ref 3.4–10.8)

## 2018-01-06 LAB — THYROID PANEL WITH TSH
Free Thyroxine Index: 1.7 (ref 1.2–4.9)
T3 Uptake Ratio: 26 % (ref 24–39)
T4, Total: 6.5 ug/dL (ref 4.5–12.0)
TSH: 1.71 u[IU]/mL (ref 0.450–4.500)

## 2018-01-06 LAB — VITAMIN D 25 HYDROXY (VIT D DEFICIENCY, FRACTURES): Vit D, 25-Hydroxy: 19 ng/mL — ABNORMAL LOW (ref 30.0–100.0)

## 2018-01-06 LAB — HEMOGLOBIN A1C
Est. average glucose Bld gHb Est-mCnc: 108 mg/dL
Hgb A1c MFr Bld: 5.4 % (ref 4.8–5.6)

## 2018-01-06 LAB — FOLLICLE STIMULATING HORMONE: FSH: 35.5 m[IU]/mL

## 2018-01-10 ENCOUNTER — Telehealth: Payer: Self-pay | Admitting: *Deleted

## 2018-01-10 ENCOUNTER — Other Ambulatory Visit: Payer: Self-pay | Admitting: Obstetrics and Gynecology

## 2018-01-10 DIAGNOSIS — E559 Vitamin D deficiency, unspecified: Secondary | ICD-10-CM

## 2018-01-10 MED ORDER — VITAMIN D (ERGOCALCIFEROL) 1.25 MG (50000 UNIT) PO CAPS: 50000.0000 [IU] | ORAL_CAPSULE | ORAL | 1 refills | Status: DC

## 2018-01-10 NOTE — Telephone Encounter (Signed)
-----   Message from Purcell NailsMelody N Shambley, [place] sent at 01/10/2018  1:57 PM EDT ----- Please mail info on Vit D def.

## 2018-01-10 NOTE — Telephone Encounter (Signed)
Mailed info to pt 

## 2018-01-27 ENCOUNTER — Ambulatory Visit: Payer: 59 | Admitting: Family Medicine

## 2018-01-27 ENCOUNTER — Encounter: Payer: Self-pay | Admitting: Family Medicine

## 2018-01-27 VITALS — BP 130/85 | HR 65 | Temp 98.3°F | Resp 16 | Ht 64.0 in | Wt 206.4 lb

## 2018-01-27 DIAGNOSIS — Z23 Encounter for immunization: Secondary | ICD-10-CM

## 2018-01-27 DIAGNOSIS — F5101 Primary insomnia: Secondary | ICD-10-CM

## 2018-01-27 DIAGNOSIS — F411 Generalized anxiety disorder: Secondary | ICD-10-CM

## 2018-01-27 DIAGNOSIS — J309 Allergic rhinitis, unspecified: Secondary | ICD-10-CM

## 2018-01-27 DIAGNOSIS — R760 Raised antibody titer: Secondary | ICD-10-CM | POA: Diagnosis not present

## 2018-01-27 DIAGNOSIS — R7982 Elevated C-reactive protein (CRP): Secondary | ICD-10-CM

## 2018-01-27 DIAGNOSIS — M170 Bilateral primary osteoarthritis of knee: Secondary | ICD-10-CM

## 2018-01-27 MED ORDER — AZELASTINE HCL 0.1 % NA SOLN: 1.0000 | Freq: Two times a day (BID) | NASAL | 2 refills | Status: AC

## 2018-01-27 NOTE — Progress Notes (Signed)
Name: Sharon Owen   MRN: [id number]    DOB: 1966/02/21   Date:01/27/2018       Progress Note  Subjective  Chief Complaint  Chief Complaint  Patient presents with  . Medication Refill  . Hypertension    Denies any symptoms  . Insomnia  . Anxiety    HPI  Obesity/s/p bariatric surgery: she states she has been obesity since she turned 52 years old. She was in the 160's lbs before she had children. She has tried weight watchers, Adipex, Qsymia ( raised her bp ), Atkins diet. She had sleeve surgery on 02/28/2017 her weight was 270 's, she is down 65 lbs since. She is feeling well`  Elevated C-reactive protein and lupus anticoagulant:seen by Rheumatologist but lost to follow up after that. No significant joint problems since. Weight loss has helped   Insomnia; she has been taking medication prn , lasting over 6 months, but she needs a refill now. She denies side effects of medication . She will lose her insurance by the end of this month and would like to have refills of her medications  Anxiety: she usually only uses a few pills of alprazolam per month, she still has some left but would like a refill to keep on file. GAD today is 3 today   Prediabetes : no polyphagia, polydipsia or polyuria.   Sore throat: she has a history of allergies, nasal congestion and has not been taking allergy medication, waking up with scratchy throat, cannot tolerate flonase. We will try Astelin  Patient Active Problem List   Diagnosis Date Noted  . Vitamin D deficiency 01/10/2018  . S/P laparoscopic sleeve gastrectomy with hiatal hernia repair Nov 2018 02/28/2017  . Hyperlipidemia 06/25/2016  . Metabolic syndrome 11/19/2015  . Lupus anticoagulant positive 11/19/2015  . Elevated C-reactive protein 07/08/2015  . Perimenopause 05/28/2015  . Morbid obesity (HCC) 03/11/2015  . Gastro-esophageal reflux disease without esophagitis 03/11/2015  . Anxiety 03/11/2015  . Insomnia, persistent 03/11/2015   . Osteoarthritis of both knees 03/11/2015  . Episcleritis 03/11/2015  . History of fracture of tibia 05/23/2014  . History of sprain of ankle 05/23/2014  . S/P right knee arthroscopy 12/28/2011  . Mononeuritis leg 07/17/2011  . Night muscle spasms 07/17/2011    Past Surgical History:  Procedure Laterality Date  . CESAREAN SECTION  1988  . CHONDROPLASTY  12/24/2011   Procedure: CHONDROPLASTY;  Surgeon: Vickki Hearing, MD;  Location: [facility];  Service: Orthopedics;  Laterality: Right;  . DILATION AND CURETTAGE OF UTERUS     "more than 1"  . FRACTURE SURGERY    . INTRAUTERINE DEVICE INSERTION  ~ 2016  . LAPAROSCOPIC GASTRIC SLEEVE RESECTION N/A 02/28/2017   Procedure: LAPAROSCOPIC GASTRIC SLEEVE RESECTION, UPPER ENDOSCOPY WITH HIATAL HERNIA REPAIR;  Surgeon: Luretha Murphy, MD;  Location: [facility];  Service: General;  Laterality: N/A;  . TIBIA IM NAIL INSERTION Right 05/23/2014   Procedure: INTRAMEDULLARY (IM) NAIL RIGHT TIBIA;  Surgeon: Harvie Junior, MD;  Location: [facility];  Service: Orthopedics;  Laterality: Right;  . VENTRAL HERNIA REPAIR  02/28/2017    Family History  Problem Relation Age of Onset  . Hypertension Mother   . Hypertension Father   . Diabetes Father   . Stroke Father   . Aortic dissection Father   . Insomnia Sister   . Hypertension Sister   . Diabetes Sister   . Heart failure Maternal Grandfather   . Diabetes Paternal Grandfather   . Heart attack  Paternal Grandfather     Social History   Socioeconomic History  . Marital status: Married    Spouse name: Gelene Mink   . Number of children: 3  . Years of education: Not on file  . Highest education level: Not on file  Occupational History  . Not on file  Social Needs  . Financial resource strain: Somewhat hard  . Food insecurity:    Worry: Never true    Inability: Never true  . Transportation needs:    Medical: No    Non-medical: No  Tobacco Use  . Smoking status: Never Smoker  . Smokeless tobacco:  Never Used  Substance and Sexual Activity  . Alcohol use: Yes    Alcohol/week: 1.0 standard drinks    Types: 1 Shots of liquor per week  . Drug use: No  . Sexual activity: Yes    Birth control/protection: IUD    Comment: Mirena  Lifestyle  . Physical activity:    Days per week: 2 days    Minutes per session: 90 min  . Stress: To some extent  Relationships  . Social connections:    Talks on phone: More than three times a week    Gets together: More than three times a week    Attends religious service: More than 4 times per year    Active member of club or organization: Yes    Attends meetings of clubs or organizations: More than 4 times per year    Relationship status: Married  . Intimate partner violence:    Fear of current or ex partner: No    Emotionally abused: No    Physically abused: No    Forced sexual activity: No  Other Topics Concern  . Not on file  Social History Narrative   Married, working two jobs ( taking one extra shift to help pay for daughter' college)     Current Outpatient Medications:  .  ALPRAZolam (XANAX) 0.5 MG tablet, TAKE HALF TO ONE TABLET BY MOUTH EVERY 4 HOURS AS NEEDED FOR ANXIETY, Disp: 30 tablet, Rfl: 0 .  cyclobenzaprine (FLEXERIL) 10 MG tablet, Take 1 tablet (10 mg total) by mouth at bedtime., Disp: 90 tablet, Rfl: 0 .  diclofenac sodium (VOLTAREN) 1 % GEL, Apply 2 g topically at bedtime as needed (for pain)., Disp: 100 g, Rfl: 1 .  diphenhydrAMINE (BENADRYL) 25 MG tablet, Take 25 mg by mouth every 6 (six) hours as needed for itching. Reported on 05/28/2015, Disp: , Rfl:  .  Multiple Vitamins-Minerals (MULTIVITAMIN PO), Take 1 tablet by mouth daily., Disp: , Rfl:  .  pantoprazole (PROTONIX) 40 MG tablet, Take 40 mg by mouth daily., Disp: , Rfl: 3 .  phentermine 37.5 MG capsule, Take 1 capsule (37.5 mg total) by mouth every morning., Disp: 30 capsule, Rfl: 2 .  Vitamin D, Ergocalciferol, (DRISDOL) 50000 units CAPS capsule, Take 1 capsule (50,000  Units total) by mouth 2 (two) times a week., Disp: 30 capsule, Rfl: 1 .  zolpidem (AMBIEN) 10 MG tablet, Take 0.5-1 tablets (5-10 mg total) by mouth at bedtime as needed for sleep., Disp: 90 tablet, Rfl: 0  Allergies  Allergen Reactions  . Adhesive [Tape] Itching  . Latex Itching  . Meloxicam Other (See Comments)    Elevated BP No issues if taken PRN   . Morphine And Related Rash    I personally reviewed active problem list, medication list, allergies, family history, social history with the patient/caregiver today.   ROS  Constitutional: Negative  for fever or weight change.  Respiratory: Negative for cough and shortness of breath.   Cardiovascular: Negative for chest pain or palpitations.  Gastrointestinal: Negative for abdominal pain, no bowel changes.  Musculoskeletal: Negative for gait problem or joint swelling.  Skin: Negative for rash.  Neurological: Negative for dizziness or headache.  No other specific complaints in a complete review of systems (except as listed in HPI above).  Objective  Vitals:   01/27/18 1533  BP: 130/85  Pulse: 65  Resp: 16  Temp: 98.3 F (36.8 C)  TempSrc: Oral  SpO2: 99%  Weight: 206 lb 6.4 oz (93.6 kg)  Height: 5\' 4"  (1.626 m)    Body mass index is 35.43 kg/m.  Physical Exam  Constitutional: Patient appears well-developed and well-nourished. Obese  No distress.  HEENT: head atraumatic, normocephalic, pupils equal and reactive to light, neck supple, throat within normal limits Cardiovascular: Normal rate, regular rhythm and normal heart sounds.  No murmur heard. No BLE edema. Pulmonary/Chest: Effort normal and breath sounds normal. No respiratory distress. Abdominal: Soft.  There is no tenderness. Psychiatric: Patient has a normal mood and affect. behavior is normal. Judgment and thought content normal.  Recent Results (from the past 2160 hour(s))  POCT urinalysis dipstick     Status: None   Collection Time: 01/05/18 10:49 AM   Result Value Ref Range   Color, UA dark yellow    Clarity, UA clear    Glucose, UA Negative Negative   Bilirubin, UA neg    Ketones, UA neg    Spec Grav, UA 1.010 1.010 - 1.025   Blood, UA neg    pH, UA 6.0 5.0 - 8.0   Protein, UA Negative Negative   Urobilinogen, UA 0.2 0.2 or 1.0 E.U./dL   Nitrite, UA neg    Leukocytes, UA Negative Negative   Appearance     Odor    Thyroid Panel With TSH     Status: None   Collection Time: 01/05/18 11:19 AM  Result Value Ref Range   TSH 1.710 0.450 - 4.500 uIU/mL   T4, Total 6.5 4.5 - 12.0 ug/dL   T3 Uptake Ratio 26 24 - 39 %   Free Thyroxine Index 1.7 1.2 - 4.9  CBC     Status: None   Collection Time: 01/05/18 11:19 AM  Result Value Ref Range   WBC 6.3 3.4 - 10.8 x10E3/uL   RBC 4.41 3.77 - 5.28 x10E6/uL   Hemoglobin 13.2 11.1 - 15.9 g/dL   Hematocrit 16.1 09.6 - 46.6 %   MCV 92 79 - 97 fL   MCH 29.9 26.6 - 33.0 pg   MCHC 32.4 31.5 - 35.7 g/dL   RDW 04.5 40.9 - 81.1 %   Platelets 297 150 - 450 x10E3/uL  Lipid panel     Status: Abnormal   Collection Time: 01/05/18 11:19 AM  Result Value Ref Range   Cholesterol, Total 187 100 - 199 mg/dL   Triglycerides 85 0 - 149 mg/dL   HDL 60 >91 mg/dL   VLDL Cholesterol Cal 17 5 - 40 mg/dL   LDL Calculated 478 (H) 0 - 99 mg/dL   Chol/HDL Ratio 3.1 0.0 - 4.4 ratio    Comment:                                   T. Chol/HDL Ratio  Men  Women                               1/2 Avg.Risk  3.4    3.3                                   Avg.Risk  5.0    4.4                                2X Avg.Risk  9.6    7.1                                3X Avg.Risk 23.4   11.0   Comprehensive metabolic panel     Status: None   Collection Time: 01/05/18 11:19 AM  Result Value Ref Range   Glucose 83 65 - 99 mg/dL   BUN 13 6 - 24 mg/dL   Creatinine, Ser 1.61 0.57 - 1.00 mg/dL   GFR calc non Af Amer 88 >59 mL/min/1.73   GFR calc Af Amer 101 >59 mL/min/1.73    BUN/Creatinine Ratio 17 9 - 23   Sodium 141 134 - 144 mmol/L   Potassium 4.2 3.5 - 5.2 mmol/L   Chloride 103 96 - 106 mmol/L   CO2 25 20 - 29 mmol/L   Calcium 9.3 8.7 - 10.2 mg/dL   Total Protein 6.5 6.0 - 8.5 g/dL   Albumin 4.1 3.5 - 5.5 g/dL   Globulin, Total 2.4 1.5 - 4.5 g/dL   Albumin/Globulin Ratio 1.7 1.2 - 2.2   Bilirubin Total 0.9 0.0 - 1.2 mg/dL   Alkaline Phosphatase 86 39 - 117 IU/L   AST 16 0 - 40 IU/L   ALT 10 0 - 32 IU/L  Hemoglobin A1c     Status: None   Collection Time: 01/05/18 11:19 AM  Result Value Ref Range   Hgb A1c MFr Bld 5.4 4.8 - 5.6 %    Comment:          Prediabetes: 5.7 - 6.4          Diabetes: >6.4          Glycemic control for adults with diabetes: <7.0    Est. average glucose Bld gHb Est-mCnc 108 mg/dL  Vitamin D (25 hydroxy)     Status: Abnormal   Collection Time: 01/05/18 11:19 AM  Result Value Ref Range   Vit D, 25-Hydroxy 19.0 (L) 30.0 - 100.0 ng/mL    Comment: Vitamin D deficiency has been defined by the Institute of Medicine and an Endocrine Society practice guideline as a level of serum 25-OH vitamin D less than 20 ng/mL (1,2). The Endocrine Society went on to further define vitamin D insufficiency as a level between 21 and 29 ng/mL (2). 1. IOM (Institute of Medicine). 2010. Dietary reference    intakes for calcium and D. [place]: The    [employer]. 2. Holick MF, Binkley Milton, Bischoff-Ferrari HA, et al.    Evaluation, treatment, and prevention of vitamin D    deficiency: an Endocrine Society clinical practice    guideline. JCEM. 2011 Jul; 96(7):1911-30.   Follicle stimulating hormone     Status: None   Collection Time: 01/05/18 11:19 AM  Result Value Ref  Range   FSH 35.5 mIU/mL    Comment:                     Adult Female:                       Follicular phase      3.5 -  12.5                       Ovulation phase       4.7 -  21.5                       Luteal phase          1.7 -   7.7                        Postmenopausal       25.8 - 134.8      PHQ2/9: Depression screen [facility] 2/9 01/27/2018 12/02/2017 04/28/2017 02/14/2017 01/10/2017  Decreased Interest 0 0 0 0 0  Down, Depressed, Hopeless 0 0 0 0 0  PHQ - 2 Score 0 0 0 0 0  Altered sleeping 0 0 - - -  Tired, decreased energy 0 0 - - -  Change in appetite 0 0 - - -  Feeling bad or failure about yourself  0 0 - - -  Trouble concentrating 0 0 - - -  Moving slowly or fidgety/restless 0 0 - - -  Suicidal thoughts 0 0 - - -  PHQ-9 Score 0 0 - - -  Difficult doing work/chores Not difficult at all Not difficult at all - - -     Fall Risk: Fall Risk  01/27/2018 12/02/2017 05/09/2017 04/28/2017 02/14/2017  Falls in the past year? No No No No No  Number falls in past yr: - - - - -  Injury with Fall? - - - - -  Comment - - - - -     Functional Status Survey: Is the patient deaf or have difficulty hearing?: No Does the patient have difficulty seeing, even when wearing glasses/contacts?: Yes Does the patient have difficulty concentrating, remembering, or making decisions?: No Does the patient have difficulty dressing or bathing?: No Does the patient have difficulty doing errands alone such as visiting a doctor's office or shopping?: No    Assessment & Plan  1. Primary insomnia  Too soon to get refill of medication but is losing her insurance and came in sooner for follow up  2. Need for immunization against influenza  Refused flu today   3. GAD (generalized anxiety disorder)  Doing well at this time  4. Lupus anticoagulant positive   5. Morbid obesity, unspecified obesity type (HCC)  Back on Adipex given by Melody and states only takes it prn   6. Primary osteoarthritis of both knees  Doing better with weight loss   7. Elevated C-reactive protein  Seen by rheumatologist    8. Allergic rhinitis, unspecified seasonality, unspecified trigger  We will try Astelin

## 2018-02-24 ENCOUNTER — Ambulatory Visit: Payer: BLUE CROSS/BLUE SHIELD | Admitting: Family Medicine

## 2018-03-10 ENCOUNTER — Ambulatory Visit: Payer: 59 | Admitting: Family Medicine

## 2018-05-05 ENCOUNTER — Ambulatory Visit: Payer: 59 | Admitting: Family Medicine

## 2018-07-26 ENCOUNTER — Telehealth: Payer: Self-pay | Admitting: Family Medicine

## 2018-07-26 NOTE — Telephone Encounter (Signed)
Copied from CRM [id number]. Topic: Quick Communication - Rx Refill/Question >> Jul 26, 2018  9:23 AM Herby Abraham C wrote: Medication: ALPRAZolam (XANAX) 0.5 MG tablet --  no refills.   Has the patient contacted their pharmacy? No  (Agent: If no, request that the patient contact the pharmacy for the refill.) (Agent: If yes, when and what did the pharmacy advise?)  Preferred Pharmacy (with phone number or street name): [id number] - [place], [place] - [address] AT [place] [address] [phone] (Phone) [phone] (Fax)    Agent: Please be advised that RX refills may take up to 3 business days. We ask that you follow-up with your pharmacy.

## 2018-07-27 ENCOUNTER — Ambulatory Visit (INDEPENDENT_AMBULATORY_CARE_PROVIDER_SITE_OTHER): Payer: Self-pay | Admitting: Family Medicine

## 2018-07-27 ENCOUNTER — Encounter: Payer: Self-pay | Admitting: Family Medicine

## 2018-07-27 VITALS — BP 132/82 | Temp 97.2°F | Ht 64.0 in

## 2018-07-27 DIAGNOSIS — R7982 Elevated C-reactive protein (CRP): Secondary | ICD-10-CM

## 2018-07-27 DIAGNOSIS — E8881 Metabolic syndrome: Secondary | ICD-10-CM

## 2018-07-27 DIAGNOSIS — E559 Vitamin D deficiency, unspecified: Secondary | ICD-10-CM

## 2018-07-27 DIAGNOSIS — F5101 Primary insomnia: Secondary | ICD-10-CM

## 2018-07-27 DIAGNOSIS — K219 Gastro-esophageal reflux disease without esophagitis: Secondary | ICD-10-CM

## 2018-07-27 DIAGNOSIS — I10 Essential (primary) hypertension: Secondary | ICD-10-CM

## 2018-07-27 DIAGNOSIS — F411 Generalized anxiety disorder: Secondary | ICD-10-CM

## 2018-07-27 MED ORDER — VITAMIN D (ERGOCALCIFEROL) 1.25 MG (50000 UNIT) PO CAPS: 50000.0000 [IU] | ORAL_CAPSULE | ORAL | 1 refills | Status: DC

## 2018-07-27 MED ORDER — ALPRAZOLAM 0.5 MG PO TABS: ORAL_TABLET | ORAL | 0 refills | Status: DC

## 2018-07-27 MED ORDER — PANTOPRAZOLE SODIUM 40 MG PO TBEC
40.0000 mg | DELAYED_RELEASE_TABLET | Freq: Every day | ORAL | 1 refills | Status: DC
Start: 2018-07-27 — End: 2019-01-26

## 2018-07-27 MED ORDER — ZOLPIDEM TARTRATE 10 MG PO TABS: 5.0000 mg | ORAL_TABLET | Freq: Every evening | ORAL | 0 refills | Status: DC | PRN

## 2018-07-27 MED ORDER — HYDROXYZINE HCL 10 MG PO TABS: 10.0000 mg | ORAL_TABLET | Freq: Three times a day (TID) | ORAL | 0 refills | Status: DC | PRN

## 2018-07-27 NOTE — Progress Notes (Signed)
Name: Sharon BlackbirdCassandra F Owen   MRN: [id number]    DOB: 02/25/1966   Date:07/27/2018       Progress Note  Subjective  Chief Complaint  Chief Complaint  Patient presents with  . Medication Refill    I connected with@ on 07/27/18 at  9:00 AM EDT by a video enabled telemedicine application and verified that I am speaking with the correct person using two identifiers.  I discussed the limitations of evaluation and management by telemedicine and the availability of in person appointments. The patient expressed understanding and agreed to proceed. Staff also discussed with the patient that there may be a patient responsible charge related to this service. Patient Location: at work  [facility]  HPI  Obesity/s/p bariatric surgery: she states she has been obesity since she turned 53 years old. She was in the 160's lbs before she had children. She has tried weight watchers, Adipex, Qsymia ( raised her bp ), Atkins diet. She had sleeve surgery on 02/28/2017 her weight was 270 's, she is down 65 lbs since, she maintaining her weight at 205 lbs. Her goal is 180 lbs. She states recently unable to go to the gym because of COVID-19. She is trying to cut down on carb  Elevated C-reactive protein and lupus anticoagulant:seen by Rheumatologist but lost to follow up after that. No significant joint problems since. Weight loss has helped , discussed rechecking in the Fall   Insomnia; shehas been taking medication prn , lasting over 6 months, but she needs a refill now. She denies side effects of medication. She will lose her insurance by the end of this month and would like to have refills of her medications  GERD: she is taking medication daily and needs refill of medication  Anxiety: GAD7 is up, she has been under more stress, symptoms include panic attack symptoms such as palpitation, some chest tightness and urgency to have a bowel movement. Discussed SSRI's but she  prefers holding off for now, we will try prn hydroxizine and refill of alprazolam going from 30 to 45 to last until next follow up  HTN: bp has been slightly higher than usual in the 130's and went to 146 once, but not on medication, heart rate also higher from 70-80's to 90's  Prediabetes : no polyphagia, polydipsia or polyuria.   Patient Active Problem List   Diagnosis Date Noted  . Vitamin D deficiency 01/10/2018  . S/P laparoscopic sleeve gastrectomy with hiatal hernia repair Nov 2018 02/28/2017  . Hyperlipidemia 06/25/2016  . Metabolic syndrome 11/19/2015  . Lupus anticoagulant positive 11/19/2015  . Elevated C-reactive protein 07/08/2015  . Perimenopause 05/28/2015  . Morbid obesity (HCC) 03/11/2015  . Gastro-esophageal reflux disease without esophagitis 03/11/2015  . Anxiety 03/11/2015  . Insomnia, persistent 03/11/2015  . Osteoarthritis of both knees 03/11/2015  . Episcleritis 03/11/2015  . History of fracture of tibia 05/23/2014  . History of sprain of ankle 05/23/2014  . S/P right knee arthroscopy 12/28/2011  . Mononeuritis leg 07/17/2011  . Night muscle spasms 07/17/2011    Past Surgical History:  Procedure Laterality Date  . CESAREAN SECTION  1988  . CHONDROPLASTY  12/24/2011   Procedure: CHONDROPLASTY;  Surgeon: Vickki HearingStanley E Harrison, MD;  Location: [facility];  Service: Orthopedics;  Laterality: Right;  . DILATION AND CURETTAGE OF UTERUS     "more than 1"  . FRACTURE SURGERY    . INTRAUTERINE DEVICE INSERTION  ~ 2016  . LAPAROSCOPIC GASTRIC SLEEVE RESECTION  N/A 02/28/2017   Procedure: LAPAROSCOPIC GASTRIC SLEEVE RESECTION, UPPER ENDOSCOPY WITH HIATAL HERNIA REPAIR;  Surgeon: Luretha Murphy, MD;  Location: [facility];  Service: General;  Laterality: N/A;  . TIBIA IM NAIL INSERTION Right 05/23/2014   Procedure: INTRAMEDULLARY (IM) NAIL RIGHT TIBIA;  Surgeon: Harvie Junior, MD;  Location: [facility];  Service: Orthopedics;  Laterality: Right;  . VENTRAL HERNIA REPAIR  02/28/2017     Family History  Problem Relation Age of Onset  . Hypertension Mother   . Hypertension Father   . Diabetes Father   . Stroke Father   . Aortic dissection Father   . Insomnia Sister   . Hypertension Sister   . Diabetes Sister   . Heart failure Maternal Grandfather   . Diabetes Paternal Grandfather   . Heart attack Paternal Grandfather     Social History   Socioeconomic History  . Marital status: Married    Spouse name: Gelene Mink   . Number of children: 3  . Years of education: Not on file  . Highest education level: Not on file  Occupational History  . Not on file  Social Needs  . Financial resource strain: Somewhat hard  . Food insecurity:    Worry: Never true    Inability: Never true  . Transportation needs:    Medical: No    Non-medical: No  Tobacco Use  . Smoking status: Never Smoker  . Smokeless tobacco: Never Used  Substance and Sexual Activity  . Alcohol use: Yes    Alcohol/week: 1.0 standard drinks    Types: 1 Shots of liquor per week  . Drug use: No  . Sexual activity: Yes    Birth control/protection: I.U.D.    Comment: Mirena  Lifestyle  . Physical activity:    Days per week: 2 days    Minutes per session: 90 min  . Stress: To some extent  Relationships  . Social connections:    Talks on phone: More than three times a week    Gets together: More than three times a week    Attends religious service: More than 4 times per year    Active member of club or organization: Yes    Attends meetings of clubs or organizations: More than 4 times per year    Relationship status: Married  . Intimate partner violence:    Fear of current or ex partner: No    Emotionally abused: No    Physically abused: No    Forced sexual activity: No  Other Topics Concern  . Not on file  Social History Narrative   Married, working two jobs ( taking one extra shift to help pay for daughter' college)     Current Outpatient Medications:  .  ALPRAZolam (XANAX) 0.5 MG  tablet, TAKE HALF TO ONE TABLET BY MOUTH EVERY 4 HOURS AS NEEDED FOR ANXIETY, Disp: 30 tablet, Rfl: 0 .  azelastine (ASTELIN) 0.1 % nasal spray, Place 1 spray into both nostrils 2 (two) times daily. Use in each nostril as directed, Disp: 30 mL, Rfl: 2 .  cyclobenzaprine (FLEXERIL) 10 MG tablet, Take 1 tablet (10 mg total) by mouth at bedtime., Disp: 90 tablet, Rfl: 0 .  diclofenac sodium (VOLTAREN) 1 % GEL, Apply 2 g topically at bedtime as needed (for pain)., Disp: 100 g, Rfl: 1 .  diphenhydrAMINE (BENADRYL) 25 MG tablet, Take 25 mg by mouth every 6 (six) hours as needed for itching. Reported on 05/28/2015, Disp: , Rfl:  .  Multiple Vitamins-Minerals (  MULTIVITAMIN PO), Take 1 tablet by mouth daily., Disp: , Rfl:  .  pantoprazole (PROTONIX) 40 MG tablet, Take 40 mg by mouth daily., Disp: , Rfl: 3 .  phentermine 37.5 MG capsule, Take 1 capsule (37.5 mg total) by mouth every morning., Disp: 30 capsule, Rfl: 2 .  Vitamin D, Ergocalciferol, (DRISDOL) 50000 units CAPS capsule, Take 1 capsule (50,000 Units total) by mouth 2 (two) times a week., Disp: 30 capsule, Rfl: 1 .  zolpidem (AMBIEN) 10 MG tablet, Take 0.5-1 tablets (5-10 mg total) by mouth at bedtime as needed for sleep., Disp: 90 tablet, Rfl: 0  Allergies  Allergen Reactions  . Adhesive [Tape] Itching  . Latex Itching  . Meloxicam Other (See Comments)    Elevated BP No issues if taken PRN   . Morphine And Related Rash    I personally reviewed active problem list, medication list, allergies, family history, social history with the patient/caregiver today.   ROS  Constitutional: Negative for fever or weight change.  Respiratory: Negative for cough and shortness of breath.   Cardiovascular: Negative for chest pain or palpitations.  Gastrointestinal: Negative for abdominal pain, no bowel changes.  Musculoskeletal: Negative for gait problem or joint swelling.  Skin: Negative for rash.  Neurological: Negative for dizziness or headache.  No  other specific complaints in a complete review of systems (except as listed in HPI above).  Objective  Virtual encounter, vitals not obtained.  Body mass index is 35.43 kg/m.  Physical Exam  Awake, alert, oriented , well groomed and in no distress   PHQ2/9: Depression screen [facility] 2/9 07/27/2018 01/27/2018 12/02/2017 04/28/2017 02/14/2017  Decreased Interest 0 0 0 0 0  Down, Depressed, Hopeless 0 0 0 0 0  PHQ - 2 Score 0 0 0 0 0  Altered sleeping 0 0 0 - -  Tired, decreased energy 0 0 0 - -  Change in appetite 0 0 0 - -  Feeling bad or failure about yourself  0 0 0 - -  Trouble concentrating 0 0 0 - -  Moving slowly or fidgety/restless 0 0 0 - -  Suicidal thoughts 0 0 0 - -  PHQ-9 Score 0 0 0 - -  Difficult doing work/chores - Not difficult at all Not difficult at all - -   PHQ-2/9 Result is negative.    Fall Risk: Fall Risk  07/27/2018 01/27/2018 12/02/2017 05/09/2017 04/28/2017  Falls in the past year? 0 No No No No  Number falls in past yr: 0 - - - -  Injury with Fall? 0 - - - -  Comment - - - - -   GAD 7 : Generalized Anxiety Score 07/27/2018  Nervous, Anxious, on Edge 3  Control/stop worrying 0  Worry too much - different things 0  Trouble relaxing 2  Restless 0  Easily annoyed or irritable 1  Afraid - awful might happen 1  Total GAD 7 Score 7  Anxiety Difficulty Not difficult at all     Assessment & Plan  1. Primary insomnia  - zolpidem (AMBIEN) 10 MG tablet; Take 0.5-1 tablets (5-10 mg total) by mouth at bedtime as needed for sleep.  Dispense: 90 tablet; Refill: 0  2. GAD (generalized anxiety disorder)  - ALPRAZolam (XANAX) 0.5 MG tablet; TAKE HALF TO ONE TABLET BY MOUTH EVERY 4 HOURS AS NEEDED FOR ANXIETY  Dispense: 45 tablet; Refill: 0 - hydrOXYzine (ATARAX/VISTARIL) 10 MG tablet; Take 1 tablet (10 mg total) by mouth 3 (three) times daily  as needed for anxiety.  Dispense: 90 tablet; Refill: 0  3. Elevated C-reactive protein  Hold off until the fall to recheck labs    4. Gastro-esophageal reflux disease without esophagitis  - pantoprazole (PROTONIX) 40 MG tablet; Take 1 tablet (40 mg total) by mouth daily.  Dispense: 90 tablet; Refill: 1  5. Hypertension, benign  Doing well at this time, advised to continue to monitor   6. Metabolic syndrome  On life style modification   7. Vitamin D deficiency  - Vitamin D, Ergocalciferol, (DRISDOL) 1.25 MG (50000 UT) CAPS capsule; Take 1 capsule (50,000 Units total) by mouth 2 (two) times a week.  Dispense: 30 capsule; Refill: 1  I discussed the assessment and treatment plan with the patient. The patient was provided an opportunity to ask questions and all were answered. The patient agreed with the plan and demonstrated an understanding of the instructions.  The patient was advised to call back or seek an in-person evaluation if the symptoms worsen or if the condition fails to improve as anticipated.  I provided 25 minutes of non-face-to-face time during this encounter.

## 2018-10-16 ENCOUNTER — Ambulatory Visit (INDEPENDENT_AMBULATORY_CARE_PROVIDER_SITE_OTHER)
Admission: RE | Admit: 2018-10-16 | Discharge: 2018-10-16 | Disposition: A | Discharge status: Home / Self Care | Payer: Managed Care, Other (non HMO) | Source: Ambulatory Visit

## 2018-10-16 DIAGNOSIS — T7840XA Allergy, unspecified, initial encounter: Secondary | ICD-10-CM

## 2018-10-16 MED ORDER — LEVOCETIRIZINE DIHYDROCHLORIDE 5 MG PO TABS: 5.0000 mg | ORAL_TABLET | Freq: Every evening | ORAL | 1 refills | Status: AC

## 2018-10-16 NOTE — ED Provider Notes (Signed)
Virtual Visit via Video Note:  [name]  initiated request for Telemedicine visit with [facility] team. I connected with [name]  on 10/16/2018 at 11:40 AM  for a synchronized telemedicine visit using a video enabled HIPPA compliant telemedicine application. I verified that I am speaking with [name]  using two identifiers. Orvan July, NP  was physically located in a [facility] Urgent care site and AURORE REDINGER was located at a different location.   The limitations of evaluation and management by telemedicine as well as the availability of in-person appointments were discussed. Patient was informed that she  may incur a bill ( including co-pay) for this virtual visit encounter. Sharon Owen  expressed understanding and gave verbal consent to proceed with virtual visit.     History of Present Illness:Sharon Owen  is a 53 y.o. female presents with allergy type symptoms.  Reports that for the past 4 days she has been having itchy scratchy throat, sneezing, itchy watery eyes.  Today she started having some dizziness and sinus pressure.  She has not taken her allergy medicine and 4 days.  She typically takes Xyzal at nighttime.  Mild cough due to PND. No chest congestion, SOB, fever. Recently tested negative for COVID. 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   Past Medical History:  Diagnosis Date  . Anxiety   . Chronic osteoarthritis    "knees" (06/24/2016)  . Complication of anesthesia    "I'm hard to wake up" (06/24/2016)  . Elevated blood pressure   . Episclerotitis, right    "of eye"  . GERD (gastroesophageal reflux disease)   . Headache    "monthly at least" (06/24/2016)  . Hypertension   . Insomnia   . Metabolic syndrome   . Morbid obesity (HCC)     Allergies  Allergen Reactions  . Adhesive [Tape] Itching  . Latex Itching  . Meloxicam Other (See Comments)    Elevated BP No issues if taken PRN   . Morphine And Related Rash         Observations/Objective:GENERAL APPEARANCE: Well developed, well nourished, alert and cooperative, and appears to be in no acute distress. HEAD: normocephalic. Non labored breathing, no dyspnea or distress, coughing during exam Skin: Skin normal color  PSYCHIATRIC: The mental examination revealed the patient was oriented to person, place, and time. The patient was able to demonstrate good judgement and reason, without hallucinations, abnormal affect or abnormal behaviors during the examination. Patient is not suicidal     Assessment and Plan: Symptoms consistent with allergies. Refilled the Xyzal   Follow Up Instructions:Follow up as needed for continued or worsening symptoms     I discussed the assessment and treatment plan with the patient. The patient was provided an opportunity to ask questions and all were answered. The patient agreed with the plan and demonstrated an understanding of the instructions.   The patient was advised to call back or seek an in-person evaluation if the symptoms worsen or if the condition fails to improve as anticipated.     Orvan July, NP  10/16/2018 11:40 AM         Orvan July, NP 10/16/18 [id number]

## 2018-10-16 NOTE — Discharge Instructions (Signed)
I believe that your symptoms are allergy related.  Take the medication as prescribed. Follow up as needed for continued or worsening symptoms

## 2018-11-10 IMAGING — DX DG CHEST 2V
2 series · 2 of 2 positions shown · non-contrast
Comparison: PA and lateral chest 07/08/2015.

CLINICAL DATA: Chest pain beginning this morning.

EXAM:
CHEST  2 VIEW

[w chest pa]
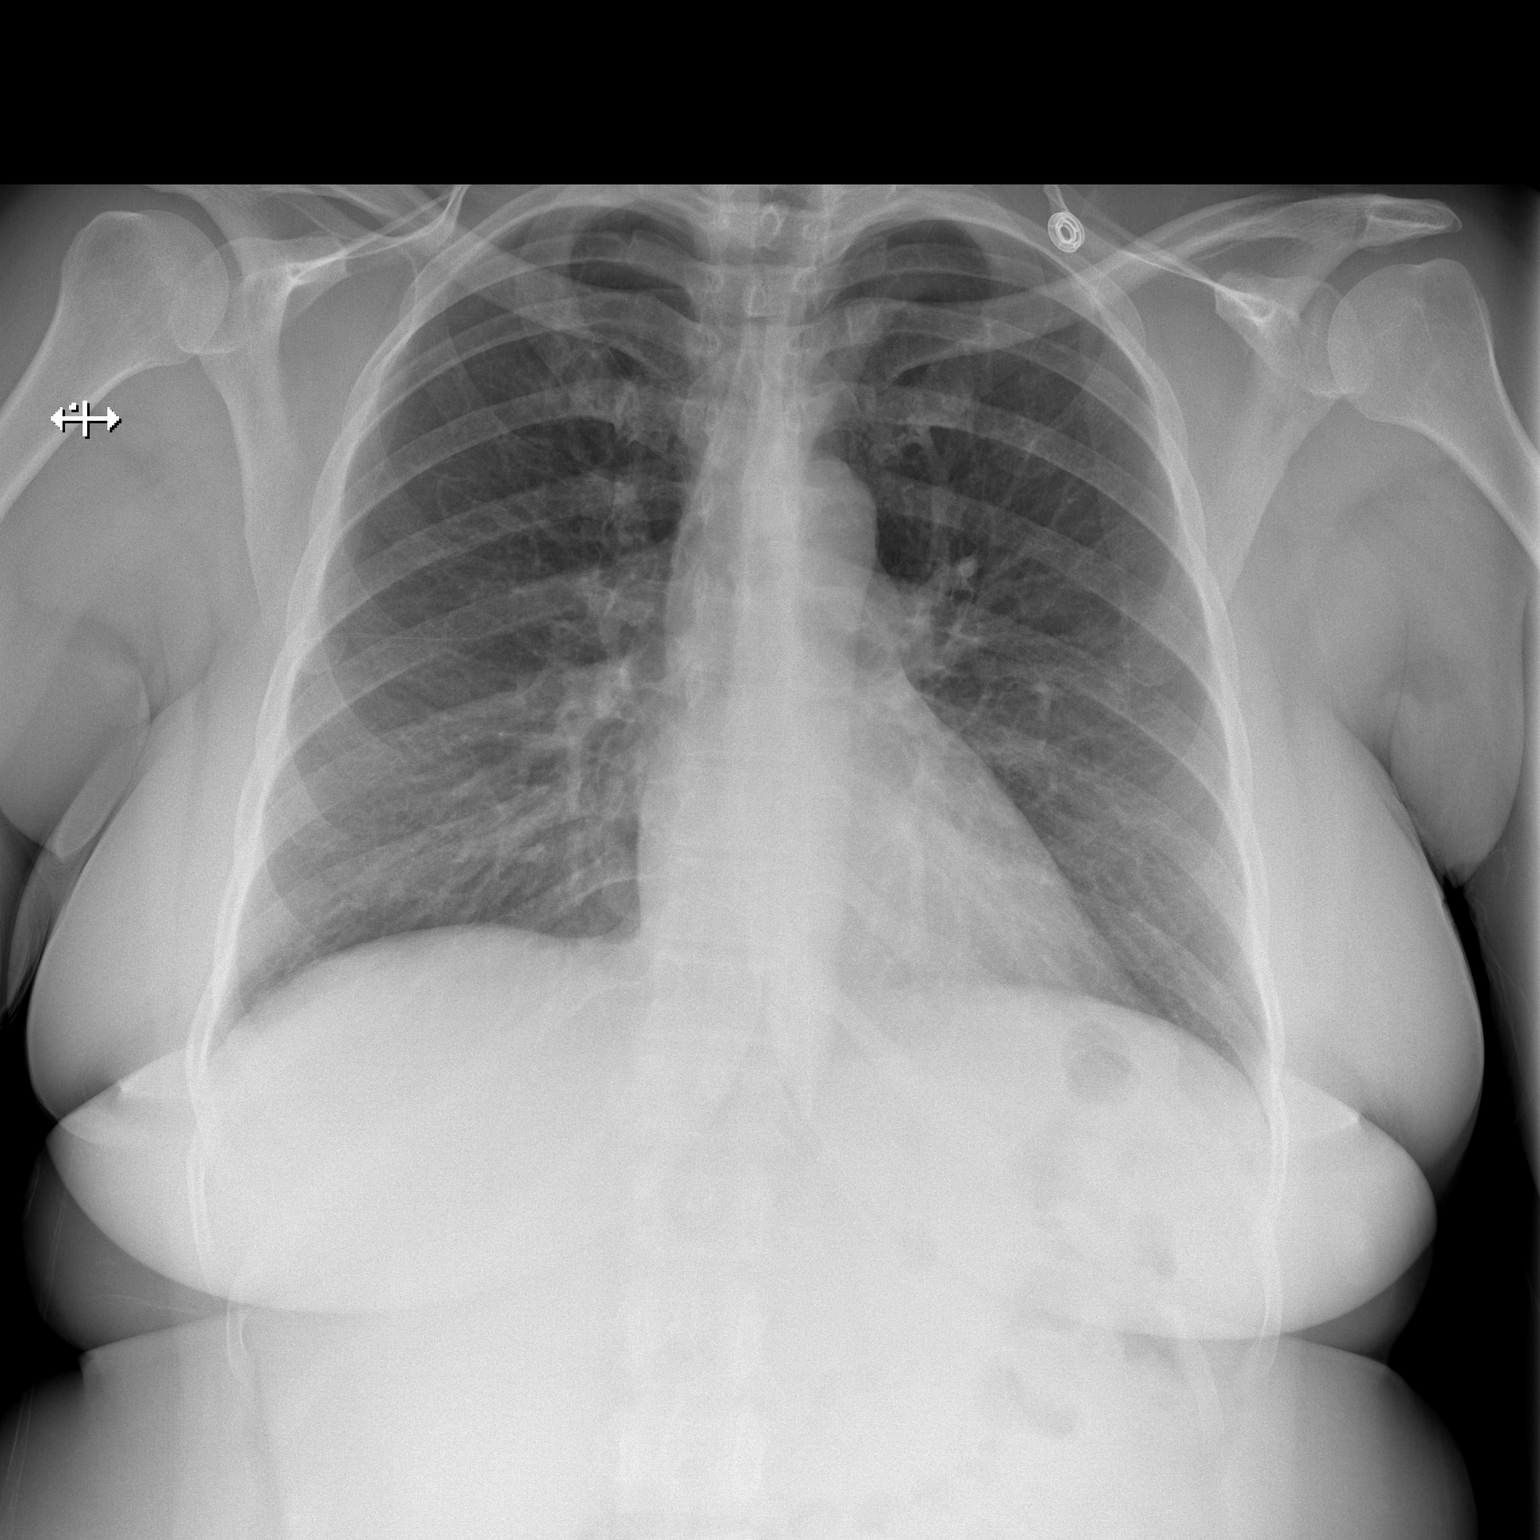

[w chest lat]
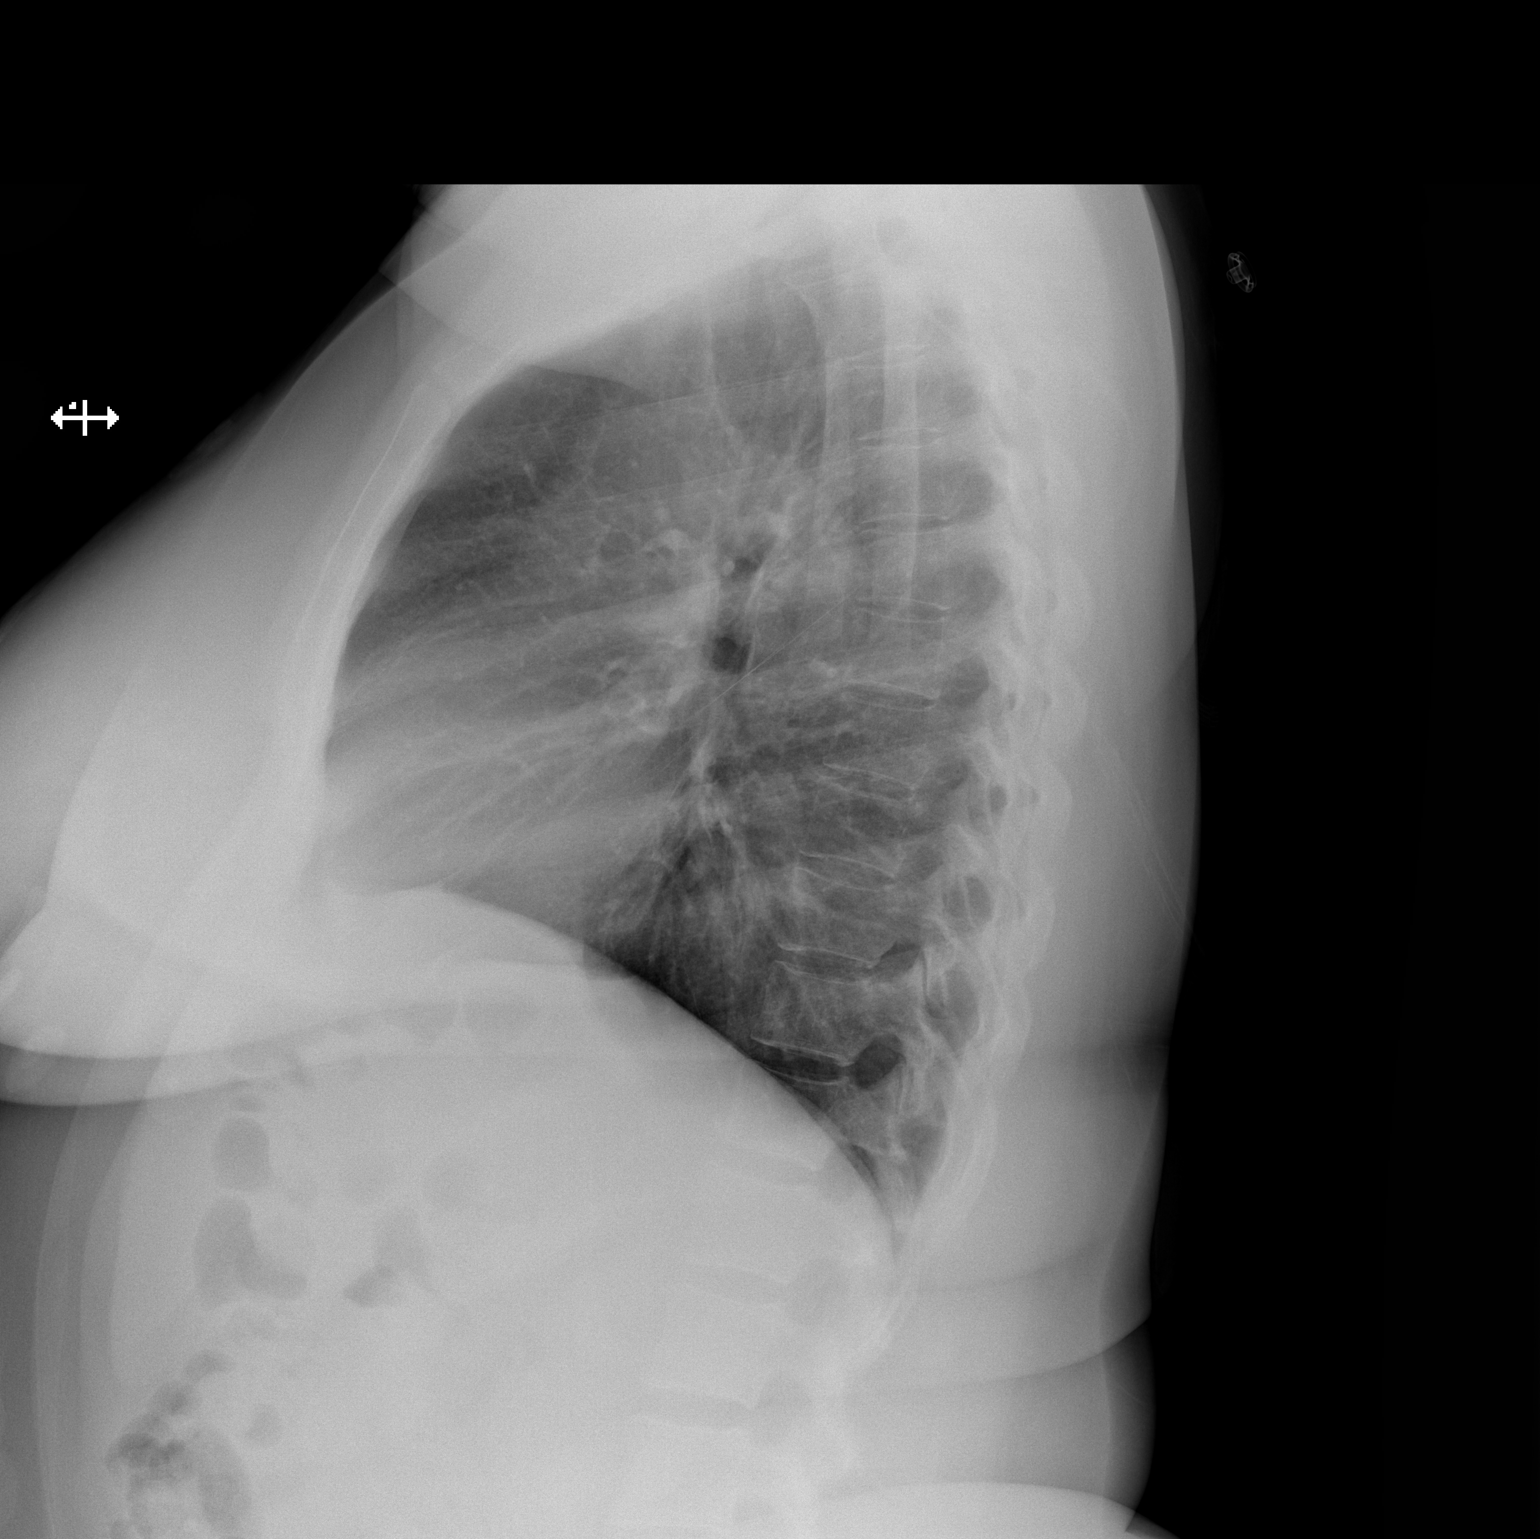

[2 of 2 positions shown; findings below may reference images not displayed]

FINDINGS: The lungs are clear. Heart size is normal. No pneumothorax or
pleural fluid. No bony abnormality.
IMPRESSION: Normal chest.

## 2019-01-11 ENCOUNTER — Encounter: Payer: 59 | Admitting: Obstetrics and Gynecology

## 2019-01-24 ENCOUNTER — Other Ambulatory Visit: Payer: Self-pay | Admitting: Family Medicine

## 2019-01-24 DIAGNOSIS — F5101 Primary insomnia: Secondary | ICD-10-CM

## 2019-01-24 NOTE — Telephone Encounter (Signed)
Review for refill of controlled medication.

## 2019-01-26 ENCOUNTER — Other Ambulatory Visit: Payer: Self-pay

## 2019-01-26 ENCOUNTER — Ambulatory Visit (INDEPENDENT_AMBULATORY_CARE_PROVIDER_SITE_OTHER): Payer: Self-pay | Admitting: Family Medicine

## 2019-01-26 ENCOUNTER — Encounter: Payer: Self-pay | Admitting: Family Medicine

## 2019-01-26 DIAGNOSIS — F411 Generalized anxiety disorder: Secondary | ICD-10-CM

## 2019-01-26 DIAGNOSIS — M62838 Other muscle spasm: Secondary | ICD-10-CM

## 2019-01-26 DIAGNOSIS — K219 Gastro-esophageal reflux disease without esophagitis: Secondary | ICD-10-CM

## 2019-01-26 DIAGNOSIS — Z1231 Encounter for screening mammogram for malignant neoplasm of breast: Secondary | ICD-10-CM

## 2019-01-26 DIAGNOSIS — I10 Essential (primary) hypertension: Secondary | ICD-10-CM

## 2019-01-26 DIAGNOSIS — F5101 Primary insomnia: Secondary | ICD-10-CM

## 2019-01-26 DIAGNOSIS — E785 Hyperlipidemia, unspecified: Secondary | ICD-10-CM

## 2019-01-26 DIAGNOSIS — E559 Vitamin D deficiency, unspecified: Secondary | ICD-10-CM

## 2019-01-26 DIAGNOSIS — E8881 Metabolic syndrome: Secondary | ICD-10-CM

## 2019-01-26 DIAGNOSIS — M170 Bilateral primary osteoarthritis of knee: Secondary | ICD-10-CM

## 2019-01-26 MED ORDER — DICLOFENAC SODIUM 1 % TD GEL: 2.0000 g | Freq: Every evening | TRANSDERMAL | 1 refills | Status: AC | PRN

## 2019-01-26 MED ORDER — ALPRAZOLAM 0.5 MG PO TABS: ORAL_TABLET | ORAL | 0 refills | Status: DC

## 2019-01-26 MED ORDER — PANTOPRAZOLE SODIUM 40 MG PO TBEC: 40.0000 mg | DELAYED_RELEASE_TABLET | Freq: Every day | ORAL | 1 refills | Status: DC

## 2019-01-26 MED ORDER — HYDROXYZINE HCL 10 MG PO TABS: 10.0000 mg | ORAL_TABLET | Freq: Three times a day (TID) | ORAL | 0 refills | Status: DC | PRN

## 2019-01-26 MED ORDER — CYCLOBENZAPRINE HCL 10 MG PO TABS: 10.0000 mg | ORAL_TABLET | Freq: Every day | ORAL | 0 refills | Status: DC

## 2019-01-26 MED ORDER — VITAMIN D (ERGOCALCIFEROL) 1.25 MG (50000 UNIT) PO CAPS: 50000.0000 [IU] | ORAL_CAPSULE | ORAL | 1 refills | Status: AC

## 2019-01-26 MED ORDER — TRAZODONE HCL 50 MG PO TABS: 50.0000 mg | ORAL_TABLET | Freq: Every evening | ORAL | 1 refills | Status: DC | PRN

## 2019-01-26 NOTE — Progress Notes (Signed)
Name: Sharon Owen   MRN: [id number]    DOB: 02/09/1966   Date:01/26/2019       Progress Note  Subjective  Chief Complaint  Chief Complaint  Patient presents with  . Hypertension  . Insomnia  . Medication Management    Would like to change Ambien to Trazodone    I connected with  Vic Blackbird on 01/26/19 at  2:00 PM EDT by telephone and verified that I am speaking with the correct person using two identifiers.  I discussed the limitations, risks, security and privacy concerns of performing an evaluation and management service by telephone and the availability of in person appointments. Staff also discussed with the patient that there may be a patient responsible charge related to this service. Patient Location: at work  Provider Location: [facility]   HPI  Obesity/s/p bariatric surgery: she states she has been obesity since she turned 53 years old. She was in the 160's lbs before she had children. She has tried weight watchers, Adipex, Qsymia ( raised her bp ), Atkins diet. She had sleeve surgery on 02/28/2017 her weight was 270 's, she went down  65 lbs and as around  205 lbs, however with COVID and stress, lack of exercise and weight has gone up to 221 lbs.   Elevated C-reactive protein and lupus anticoagulant:seen by Rheumatologistbut lost to follow up after that. 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 Discussed SSRI's but she prefers holding off for now, we will try prn hydroxizine and we will go down from alprazolam 45 to 42 and try to go down again next visit.   HTN: bp at home has been well controlled at home, she states 120-130's/70-86   Prediabetes : no polyphagia, polydipsia or polyuria.Unchanged   Patient Active Problem List   Diagnosis Date Noted  . Vitamin D deficiency 01/10/2018  . S/P laparoscopic sleeve gastrectomy with hiatal hernia repair Nov 2018 02/28/2017  . Hyperlipidemia 06/25/2016  . Metabolic syndrome 11/19/2015  . Lupus anticoagulant positive 11/19/2015  . Elevated C-reactive protein 07/08/2015  . Perimenopause 05/28/2015  . Morbid obesity (HCC) 03/11/2015  . Gastro-esophageal reflux disease without esophagitis 03/11/2015  . Anxiety 03/11/2015  . Insomnia, persistent 03/11/2015  . Osteoarthritis of both knees 03/11/2015  . Episcleritis 03/11/2015  . History of fracture of tibia 05/23/2014  . History of sprain of ankle 05/23/2014  . S/P right knee arthroscopy 12/28/2011  . Mononeuritis leg 07/17/2011  . Night muscle spasms 07/17/2011    Past Surgical History:  Procedure Laterality Date  . CESAREAN SECTION  1988  . CHONDROPLASTY  12/24/2011   Procedure: CHONDROPLASTY;  Surgeon: Vickki Hearing, MD;  Location: [facility];  Service: Orthopedics;  Laterality: Right;  . DILATION AND CURETTAGE OF UTERUS     "more than 1"  . FRACTURE SURGERY    .  INTRAUTERINE DEVICE INSERTION  ~ 2016  . LAPAROSCOPIC GASTRIC SLEEVE RESECTION N/A 02/28/2017   Procedure: LAPAROSCOPIC GASTRIC SLEEVE RESECTION, UPPER ENDOSCOPY WITH HIATAL HERNIA REPAIR;  Surgeon: Luretha MurphyMartin, Matthew, MD;  Location: [facility];  Service: General;  Laterality: N/A;  . TIBIA IM NAIL INSERTION Right 05/23/2014   Procedure: INTRAMEDULLARY (IM) NAIL RIGHT TIBIA;  Surgeon: Harvie JuniorJohn L Graves, MD;  Location: [facility];  Service: Orthopedics;  Laterality: Right;  .  VENTRAL HERNIA REPAIR  02/28/2017    Family History  Problem Relation Age of Onset  . Hypertension Mother   . Hypertension Father   . Diabetes Father   . Stroke Father   . Aortic dissection Father   . Insomnia Sister   . Hypertension Sister   . Diabetes Sister   . Heart failure Maternal Grandfather   . Diabetes Paternal Grandfather   . Heart attack Paternal Grandfather     Social History   Socioeconomic History  . Marital status: Married    Spouse name: Gelene MinkFrederick   . Number of children: 3  . Years of education: Not on file  . Highest education level: Not on file  Occupational History  . Not on file  Social Needs  . Financial resource strain: Somewhat hard  . Food insecurity    Worry: Never true    Inability: Never true  . Transportation needs    Medical: No    Non-medical: No  Tobacco Use  . Smoking status: Never Smoker  . Smokeless tobacco: Never Used  Substance and Sexual Activity  . Alcohol use: Yes    Alcohol/week: 1.0 standard drinks    Types: 1 Shots of liquor per week  . Drug use: No  . Sexual activity: Yes    Birth control/protection: I.U.D.    Comment: Mirena  Lifestyle  . Physical activity    Days per week: 2 days    Minutes per session: 90 min  . Stress: To some extent  Relationships  . Social connections    Talks on phone: More than three times a week    Gets together: More than three times a week    Attends religious service: More than 4 times per year    Active member of club or organization: Yes    Attends meetings of clubs or organizations: More than 4 times per year    Relationship status: Married  . Intimate partner violence    Fear of current or ex partner: No    Emotionally abused: No    Physically abused: No    Forced sexual activity: No  Other Topics Concern  . Not on file  Social History Narrative   Married, working two jobs ( taking one extra shift to help pay for daughter' college)     Current Outpatient Medications:  .   ALPRAZolam (XANAX) 0.5 MG tablet, TAKE HALF TO ONE TABLET BY MOUTH EVERY 4 HOURS AS NEEDED FOR ANXIETY, Disp: 42 tablet, Rfl: 0 .  azelastine (ASTELIN) 0.1 % nasal spray, Place 1 spray into both nostrils 2 (two) times daily. Use in each nostril as directed, Disp: 30 mL, Rfl: 2 .  cyclobenzaprine (FLEXERIL) 10 MG tablet, Take 1 tablet (10 mg total) by mouth at bedtime., Disp: 30 tablet, Rfl: 0 .  diclofenac sodium (VOLTAREN) 1 % GEL, Apply 2 g topically at bedtime as needed (for pain)., Disp: 100 g, Rfl: 1 .  hydrOXYzine (ATARAX/VISTARIL) 10 MG tablet, Take 1 tablet (10 mg total) by mouth 3 (three) times daily  as needed for anxiety., Disp: 90 tablet, Rfl: 0 .  levocetirizine (XYZAL) 5 MG tablet, Take 1 tablet (5 mg total) by mouth every evening., Disp: 30 tablet, Rfl: 1 .  Multiple Vitamins-Minerals (MULTIVITAMIN PO), Take 1 tablet by mouth daily., Disp: , Rfl:  .  pantoprazole (PROTONIX) 40 MG tablet, Take 1 tablet (40 mg total) by mouth daily., Disp: 90 tablet, Rfl: 1 .  [START ON 01/29/2019] Vitamin D, Ergocalciferol, (DRISDOL) 1.25 MG (50000 UT) CAPS capsule, Take 1 capsule (50,000 Units total) by mouth 2 (two) times a week., Disp: 24 capsule, Rfl: 1 .  traZODone (DESYREL) 50 MG tablet, Take 1 tablet (50 mg total) by mouth at bedtime as needed for sleep., Disp: 90 tablet, Rfl: 1  Allergies  Allergen Reactions  . Adhesive [Tape] Itching  . Latex Itching  . Meloxicam Other (See Comments)    Elevated BP No issues if taken PRN   . Morphine And Related Rash    I personally reviewed active problem list, medication list, allergies, family history, social history with the patient/caregiver today.   ROS  Ten systems reviewed and is negative except as mentioned in HPI   Objective  Virtual encounter, vitals not obtained.  There is no height or weight on file to calculate BMI.  Physical Exam  Awake, alert and oriented   PHQ2/9: Depression screen [facility] 2/9 01/26/2019 07/27/2018 01/27/2018  12/02/2017 04/28/2017  Decreased Interest 0 0 0 0 0  Down, Depressed, Hopeless 0 0 0 0 0  PHQ - 2 Score 0 0 0 0 0  Altered sleeping 1 0 0 0 -  Tired, decreased energy 1 0 0 0 -  Change in appetite 0 0 0 0 -  Feeling bad or failure about yourself  0 0 0 0 -  Trouble concentrating 0 0 0 0 -  Moving slowly or fidgety/restless 0 0 0 0 -  Suicidal thoughts 0 0 0 0 -  PHQ-9 Score 2 0 0 0 -  Difficult doing work/chores Not difficult at all - Not difficult at all Not difficult at all -   PHQ-2/9 Result is negative    Fall Risk: Fall Risk  01/26/2019 07/27/2018 01/27/2018 12/02/2017 05/09/2017  Falls in the past year? 0 0 No No No  Number falls in past yr: 0 0 - - -  Injury with Fall? 0 0 - - -  Comment - - - - -     Assessment & Plan  1. GAD (generalized anxiety disorder)  - ALPRAZolam (XANAX) 0.5 MG tablet; TAKE HALF TO ONE TABLET BY MOUTH EVERY 4 HOURS AS NEEDED FOR ANXIETY  Dispense: 42 tablet; Refill: 0 - hydrOXYzine (ATARAX/VISTARIL) 10 MG tablet; Take 1 tablet (10 mg total) by mouth 3 (three) times daily as needed for anxiety.  Dispense: 90 tablet; Refill: 0  2. Night muscle spasms  - cyclobenzaprine (FLEXERIL) 10 MG tablet; Take 1 tablet (10 mg total) by mouth at bedtime.  Dispense: 30 tablet; Refill: 0  3. Gastro-esophageal reflux disease without esophagitis  - pantoprazole (PROTONIX) 40 MG tablet; Take 1 tablet (40 mg total) by mouth daily.  Dispense: 90 tablet; Refill: 1  4. Vitamin D deficiency  - Vitamin D, Ergocalciferol, (DRISDOL) 1.25 MG (50000 UT) CAPS capsule; Take 1 capsule (50,000 Units total) by mouth 2 (two) times a week.  Dispense: 24 capsule; Refill: 1 - VITAMIN D 25 Hydroxy (Vit-D Deficiency, Fractures)  5. Hypertension, benign  - CBC with Differential/Platelet - Comprehensive metabolic panel  6.  Metabolic syndrome  - Hemoglobin A1c  7. Primary insomnia  - traZODone (DESYREL) 50 MG tablet; Take 1 tablet (50 mg total) by mouth at bedtime as needed for  sleep.  Dispense: 90 tablet; Refill: 1  8. Primary osteoarthritis of both knees  - diclofenac sodium (VOLTAREN) 1 % GEL; Apply 2 g topically at bedtime as needed (for pain).  Dispense: 100 g; Refill: 1  9. Morbid obesity, unspecified obesity type [facility])  Discussed with the patient the risk posed by an increased BMI. Discussed importance of portion control, calorie counting and at least 150 minutes of physical activity weekly. Avoid sweet beverages and drink more water. Eat at least 6 servings of fruit and vegetables daily   10. Dyslipidemia  - Lipid panel  11. Breast cancer screening by mammogram  - MM 3D SCREEN BREAST BILATERAL; Future  She will let me know when ready for colonoscopy   I discussed the assessment and treatment plan with the patient. The patient was provided an opportunity to ask questions and all were answered. The patient agreed with the plan and demonstrated an understanding of the instructions.   The patient was advised to call back or seek an in-person evaluation if the symptoms worsen or if the condition fails to improve as anticipated.  I provided 25  minutes of non-face-to-face time during this encounter.  Ruel Favors, MD

## 2019-02-01 ENCOUNTER — Other Ambulatory Visit: Payer: Self-pay

## 2019-02-01 ENCOUNTER — Telehealth: Payer: Managed Care, Other (non HMO)

## 2019-02-01 ENCOUNTER — Ambulatory Visit
Admission: EM | Admit: 2019-02-01 | Discharge: 2019-02-01 | Disposition: A | Discharge status: Home / Self Care | Payer: Managed Care, Other (non HMO) | Attending: Emergency Medicine | Admitting: Emergency Medicine

## 2019-02-01 DIAGNOSIS — J358 Other chronic diseases of tonsils and adenoids: Secondary | ICD-10-CM

## 2019-02-01 DIAGNOSIS — J029 Acute pharyngitis, unspecified: Secondary | ICD-10-CM | POA: Insufficient documentation

## 2019-02-01 DIAGNOSIS — Z20828 Contact with and (suspected) exposure to other viral communicable diseases: Secondary | ICD-10-CM | POA: Diagnosis not present

## 2019-02-01 DIAGNOSIS — J020 Streptococcal pharyngitis: Secondary | ICD-10-CM | POA: Insufficient documentation

## 2019-02-01 DIAGNOSIS — Z20822 Contact with and (suspected) exposure to covid-19: Secondary | ICD-10-CM

## 2019-02-01 LAB — POCT RAPID STREP A (OFFICE): Rapid Strep A Screen: NEGATIVE

## 2019-02-01 MED ORDER — CETIRIZINE HCL 10 MG PO CHEW: 10.0000 mg | CHEWABLE_TABLET | Freq: Every day | ORAL | 0 refills | Status: AC

## 2019-02-01 MED ORDER — FLUTICASONE PROPIONATE 50 MCG/ACT NA SUSP: 2.0000 | Freq: Every day | NASAL | 0 refills | Status: DC

## 2019-02-01 NOTE — Discharge Instructions (Addendum)
Strep test negative, will send out for culture and we will call you with results COVID testing results pending.    In the meantime: You should remain isolated in your home for 10 days from symptom onset AND greater than 72 hours after symptoms resolution (absence of fever without the use of fever-reducing medication and improvement in respiratory symptoms), whichever is longer Get plenty of rest and push fluids Zyrtec prescribed for nasal congestion, runny nose, and/or sore throat Flonase prescribed for nasal congestion and runny nose Use medications daily for symptom relief Use OTC medications like ibuprofen or tylenol as needed fever or pain Call or go to the ED if you have any new or worsening symptoms such as fever, worsening cough, shortness of breath, chest tightness, chest pain, turning blue, changes in mental status, etc..[name]

## 2019-02-01 NOTE — ED Provider Notes (Signed)
[place]   [id number] 02/01/19 Arrival Time: 4431  CC: SORE THROAT  SUBJECTIVE: History from: patient.  Sharon Owen is a 53 y.o. female who presents with rhinorrhea, sore throat, and dry cough that began last night.  Denies sick exposure to COVID, flu or strep.  Denies recent travel.  Has tried OTC medications without relief.  Symptoms are made worse with swallowing, but tolerating liquids and own secretions without difficulty.  Reports previous symptoms in the past that improved with antibiotic.   Denies fever, chills, nasal congestion, SOB, wheezing, chest pain, nausea, rash, changes in bowel or bladder habits.    ROS: As per HPI.  All other pertinent ROS negative.     Past Medical History:  Diagnosis Date  . Anxiety   . Chronic osteoarthritis    "knees" (06/24/2016)  . Complication of anesthesia    "I'm hard to wake up" (06/24/2016)  . Elevated blood pressure   . Episclerotitis, right    "of eye"  . GERD (gastroesophageal reflux disease)   . Headache    "monthly at least" (06/24/2016)  . Hypertension   . Insomnia   . Metabolic syndrome   . Morbid obesity ([place])    Past Surgical History:  Procedure Laterality Date  . CESAREAN SECTION  1988  . CHONDROPLASTY  12/24/2011   Procedure: CHONDROPLASTY;  Surgeon: Carole Civil, MD;  Location: [facility];  Service: Orthopedics;  Laterality: Right;  . DILATION AND CURETTAGE OF UTERUS     "more than 1"  . FRACTURE SURGERY    . INTRAUTERINE DEVICE INSERTION  ~ 2016  . LAPAROSCOPIC GASTRIC SLEEVE RESECTION N/A 02/28/2017   Procedure: LAPAROSCOPIC GASTRIC SLEEVE RESECTION, UPPER ENDOSCOPY WITH HIATAL HERNIA REPAIR;  Surgeon: Johnathan Hausen, MD;  Location: [facility];  Service: General;  Laterality: N/A;  . TIBIA IM NAIL INSERTION Right 05/23/2014   Procedure: INTRAMEDULLARY (IM) NAIL RIGHT TIBIA;  Surgeon: Alta Corning, MD;  Location: [facility];  Service: Orthopedics;  Laterality: Right;  . VENTRAL HERNIA REPAIR  02/28/2017    Allergies  Allergen Reactions  . Adhesive [Tape] Itching  . Ambien [Zolpidem Tartrate]     Sleep walking   . Latex Itching  . Morphine And Related Rash   No current facility-administered medications on file prior to encounter.    Current Outpatient Medications on File Prior to Encounter  Medication Sig Dispense Refill  . ALPRAZolam (XANAX) 0.5 MG tablet TAKE HALF TO ONE TABLET BY MOUTH EVERY 4 HOURS AS NEEDED FOR ANXIETY 42 tablet 0  . azelastine (ASTELIN) 0.1 % nasal spray Place 1 spray into both nostrils 2 (two) times daily. Use in each nostril as directed 30 mL 2  . cyclobenzaprine (FLEXERIL) 10 MG tablet Take 1 tablet (10 mg total) by mouth at bedtime. 30 tablet 0  . diclofenac sodium (VOLTAREN) 1 % GEL Apply 2 g topically at bedtime as needed (for pain). 100 g 1  . hydrOXYzine (ATARAX/VISTARIL) 10 MG tablet Take 1 tablet (10 mg total) by mouth 3 (three) times daily as needed for anxiety. 90 tablet 0  . levocetirizine (XYZAL) 5 MG tablet Take 1 tablet (5 mg total) by mouth every evening. 30 tablet 1  . Multiple Vitamins-Minerals (MULTIVITAMIN PO) Take 1 tablet by mouth daily.    . pantoprazole (PROTONIX) 40 MG tablet Take 1 tablet (40 mg total) by mouth daily. 90 tablet 1  . traZODone (DESYREL) 50 MG tablet Take 1 tablet (50 mg total) by mouth at bedtime as  needed for sleep. 90 tablet 1  . Vitamin D, Ergocalciferol, (DRISDOL) 1.25 MG (50000 UT) CAPS capsule Take 1 capsule (50,000 Units total) by mouth 2 (two) times a week. 24 capsule 1   Social History   Socioeconomic History  . Marital status: Married    Spouse name: Gelene Mink   . Number of children: 3  . Years of education: Not on file  . Highest education level: Not on file  Occupational History  . Not on file  Social Needs  . Financial resource strain: Somewhat hard  . Food insecurity    Worry: Never true    Inability: Never true  . Transportation needs    Medical: No    Non-medical: No  Tobacco Use  . Smoking  status: Never Smoker  . Smokeless tobacco: Never Used  Substance and Sexual Activity  . Alcohol use: Yes    Alcohol/week: 1.0 standard drinks    Types: 1 Shots of liquor per week  . Drug use: No  . Sexual activity: Yes    Birth control/protection: I.U.D.    Comment: Mirena  Lifestyle  . Physical activity    Days per week: 2 days    Minutes per session: 90 min  . Stress: To some extent  Relationships  . Social connections    Talks on phone: More than three times a week    Gets together: More than three times a week    Attends religious service: More than 4 times per year    Active member of club or organization: Yes    Attends meetings of clubs or organizations: More than 4 times per year    Relationship status: Married  . Intimate partner violence    Fear of current or ex partner: No    Emotionally abused: No    Physically abused: No    Forced sexual activity: No  Other Topics Concern  . Not on file  Social History Narrative   Married, working two jobs ( taking one extra shift to help pay for daughter' college)   Family History  Problem Relation Age of Onset  . Hypertension Mother   . Hypertension Father   . Diabetes Father   . Stroke Father   . Aortic dissection Father   . Insomnia Sister   . Hypertension Sister   . Diabetes Sister   . Heart failure Maternal Grandfather   . Diabetes Paternal Grandfather   . Heart attack Paternal Grandfather     OBJECTIVE:  Vitals:   02/01/19 1407  BP: (!) 165/83  Pulse: 65  Resp: 20  Temp: 98.4 F (36.9 C)  SpO2: 98%     General appearance: alert; well-appearing nontoxic, speaking in full sentences and managing own secretions HEENT: NCAT; Ears: EACs clear, TMs pearly gray with visible cone of light, without erythema; Eyes: PERRL, EOMI grossly; Nose: no obvious rhinorrhea; Throat: oropharynx clear, tonsils mildly enlarged, R>L, not erythematous, tonsil stone present on RT tonsil, uvula midline Neck: supple without LAD  Lungs: CTA bilaterally without adventitious breath sounds; cough absent Heart: regular rate and rhythm.   Skin: warm and dry Psychological: alert and cooperative; irritated mood and affect  LABS: Results for orders placed or performed during the hospital encounter of 02/01/19 (from the past 24 hour(s))  POCT rapid strep A     Status: None   Collection Time: 02/01/19  2:13 PM  Result Value Ref Range   Rapid Strep A Screen Negative Negative     ASSESSMENT & PLAN:  1. Suspected COVID-19 virus infection   2. Streptococcal sore throat   3. Tonsillolith   4. Sore throat     Meds ordered this encounter  Medications  . cetirizine (ZYRTEC) 10 MG chewable tablet    Sig: Chew 1 tablet (10 mg total) by mouth daily.    Dispense:  20 tablet    Refill:  0    Order Specific Question:   Supervising Provider    Answer:   Eustace MooreNELSON, YVONNE SUE [[id number]]  . fluticasone (FLONASE) 50 MCG/ACT nasal spray    Sig: Place 2 sprays into both nostrils daily.    Dispense:  16 g    Refill:  0    Order Specific Question:   Supervising Provider    Answer:   Eustace MooreELSON, YVONNE SUE [[id number]]   Strep test negative, will send out for culture and we will call you with results COVID testing results pending.    In the meantime: You should remain isolated in your home for 10 days from symptom onset AND greater than 72 hours after symptoms resolution (absence of fever without the use of fever-reducing medication and improvement in respiratory symptoms), whichever is longer Get plenty of rest and push fluids Zyrtec prescribed for nasal congestion, runny nose, and/or sore throat Use medications daily for symptom relief Use OTC medications like ibuprofen or tylenol as needed fever or pain Call or go to the ED if you have any new or worsening symptoms such as fever, worsening cough, shortness of breath, chest tightness, chest pain, turning blue, changes in mental status, etc...   Reviewed expectations re: course of  current medical issues. Questions answered. Outlined signs and symptoms indicating need for more acute intervention. Patient verbalized understanding. After Visit Summary given.   ADDENDUM:  Patient upset at discharge, states she needs an antibiotic.  Based on duration of symptoms, physical exam, and negative strep test, I do not feel her condition warrants antibiotics at this time.  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  Usually her PCP will call in antibiotics for her.  Encouraged her to call her PCP and follow up with them for second opinion.  Offered prednisone, declines.  She states prednisone would be more harmful than antibiotics at this time.  Reassured her that prednisone would help with inflammation in her sinuses, and it would be a short duration.  Patient still declines.  Given return precautions.       Rennis HardingWurst, Zissel Biederman, PA-C 02/01/19 1453

## 2019-02-01 NOTE — ED Triage Notes (Signed)
Pt developed sore throat last night , no fever

## 2019-02-03 LAB — NOVEL CORONAVIRUS, NAA: SARS-CoV-2, NAA: NOT DETECTED

## 2019-02-04 LAB — CULTURE, GROUP A STREP (THRC)

## 2019-07-09 ENCOUNTER — Other Ambulatory Visit: Payer: Self-pay | Admitting: Family Medicine

## 2019-07-09 DIAGNOSIS — M62838 Other muscle spasm: Secondary | ICD-10-CM

## 2019-07-09 MED ORDER — CYCLOBENZAPRINE HCL 10 MG PO TABS: 10.0000 mg | ORAL_TABLET | Freq: Every day | ORAL | 0 refills | Status: DC

## 2019-07-09 NOTE — Telephone Encounter (Signed)
Requested medication (s) are due for refill today: yes  Requested medication (s) are on the active medication list: yes  Last refill:  01/26/2019  Future visit scheduled: no   Notes to clinic:  not delegated    Requested Prescriptions  Pending Prescriptions Disp Refills   cyclobenzaprine (FLEXERIL) 10 MG tablet 30 tablet 0    Sig: Take 1 tablet (10 mg total) by mouth at bedtime.      Not Delegated - Analgesics:  Muscle Relaxants Failed - 07/09/2019 10:48 AM      Failed - This refill cannot be delegated      Passed - Valid encounter within last 6 months    Recent Outpatient Visits           5 months ago Morbid obesity, unspecified obesity type [facility])   [facility] Alba Cory, MD   11 months ago Hypertension, benign   [facility] Alba Cory, MD   1 year ago Primary insomnia   [facility] Alba Cory, MD   1 year ago Primary insomnia   [facility] Alba Cory, MD   2 years ago Primary insomnia   [facility] Alba Cory, MD

## 2019-07-09 NOTE — Telephone Encounter (Signed)
Medication Refill - Medication: cyclobenzaprine (FLEXERIL) 10 MG tablet    Has the patient contacted their pharmacy? Yes.   (Agent: If no, request that the patient contact the pharmacy for the refill.) (Agent: If yes, when and what did the pharmacy advise?)  Preferred Pharmacy (with phone number or street name):  [id number] - [place], [place] - [address]  [address] [place] [address]  Phone: [phone] Fax: [phone]     Agent: Please be advised that RX refills may take up to 3 business days. We ask that you follow-up with your pharmacy.

## 2019-08-01 ENCOUNTER — Other Ambulatory Visit: Payer: Self-pay | Admitting: Family Medicine

## 2019-08-01 DIAGNOSIS — F5101 Primary insomnia: Secondary | ICD-10-CM

## 2019-08-01 NOTE — Telephone Encounter (Signed)
Requested medications are due for refill today?  Yes  Requested medications are on active medication list?  Yes  Last Refill:  01/26/2019   # 90 with 1 refill.    Future visit scheduled? No  Notes to Clinic:  medication failed RX refill protocol due to patient being due for an office visit.

## 2019-08-02 NOTE — Telephone Encounter (Signed)
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

## 2019-08-03 ENCOUNTER — Encounter: Payer: Self-pay | Admitting: Family Medicine

## 2019-08-03 ENCOUNTER — Ambulatory Visit (INDEPENDENT_AMBULATORY_CARE_PROVIDER_SITE_OTHER): Payer: Managed Care, Other (non HMO) | Admitting: Family Medicine

## 2019-08-03 VITALS — BP 132/86 | Wt 235.0 lb

## 2019-08-03 DIAGNOSIS — F411 Generalized anxiety disorder: Secondary | ICD-10-CM

## 2019-08-03 DIAGNOSIS — E559 Vitamin D deficiency, unspecified: Secondary | ICD-10-CM | POA: Diagnosis not present

## 2019-08-03 DIAGNOSIS — I10 Essential (primary) hypertension: Secondary | ICD-10-CM

## 2019-08-03 DIAGNOSIS — E785 Hyperlipidemia, unspecified: Secondary | ICD-10-CM

## 2019-08-03 DIAGNOSIS — Z6841 Body Mass Index (BMI) 40.0 and over, adult: Secondary | ICD-10-CM

## 2019-08-03 DIAGNOSIS — F5101 Primary insomnia: Secondary | ICD-10-CM

## 2019-08-03 DIAGNOSIS — E8881 Metabolic syndrome: Secondary | ICD-10-CM

## 2019-08-03 DIAGNOSIS — M62838 Other muscle spasm: Secondary | ICD-10-CM

## 2019-08-03 DIAGNOSIS — Z9884 Bariatric surgery status: Secondary | ICD-10-CM

## 2019-08-03 DIAGNOSIS — K219 Gastro-esophageal reflux disease without esophagitis: Secondary | ICD-10-CM

## 2019-08-03 MED ORDER — HYDROXYZINE HCL 10 MG PO TABS: 10.0000 mg | ORAL_TABLET | Freq: Three times a day (TID) | ORAL | 0 refills | Status: AC | PRN

## 2019-08-03 MED ORDER — CYCLOBENZAPRINE HCL 10 MG PO TABS: 10.0000 mg | ORAL_TABLET | Freq: Every day | ORAL | 1 refills | Status: DC

## 2019-08-03 MED ORDER — TRAZODONE HCL 50 MG PO TABS: 50.0000 mg | ORAL_TABLET | Freq: Every evening | ORAL | 1 refills | Status: DC | PRN

## 2019-08-03 MED ORDER — PANTOPRAZOLE SODIUM 40 MG PO TBEC: 40.0000 mg | DELAYED_RELEASE_TABLET | Freq: Every day | ORAL | 1 refills | Status: DC

## 2019-08-03 NOTE — Progress Notes (Signed)
Name: Sharon Owen   MRN: [id number]    DOB: [birth year]   Date:08/03/2019       Progress Note  Subjective  Chief Complaint  Chief Complaint  Patient presents with  . Medication Refill    Patient is needing a refill on Trazodone. She is no longer taking Ambien. She also so needs Hydroxyzine.    I connected with  BROOKLYN ALFREDO on 08/03/19 at  3:20 PM EDT by telephone and verified that I am speaking with the correct person using two identifiers.  I discussed the limitations, risks, security and privacy concerns of performing an evaluation and management service by telephone and the availability of in person appointments. Staff also discussed with the patient that there may be a patient responsible charge related to this service. Patient Location: at a hotel in [place] - [employer] Provider Location: [facility]   HPI  Obesity/s/p bariatric surgery: she states she has been obesity since she turned 54 years old. She was in the 160's lbs before she had children. She has tried weight watchers, Adipex, Qsymia ( raised her bp ), Atkins diet. She had sleeve surgery on 02/28/2017 her weight was 270 's, she went down  65 lbs and as around  205 lbs, however with COVID and stress, lack of exercise and weight has gone up to 225 lbs. She states she is looking forward to the Summer so she can be more active. 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 No significant joint problems since.She still feels stiff in am's but no rashes, uses voltaren prn for joint pains . She does not need refills at this time   Insomnia; shewas taking ambien, but daughter found her sleep walking one night and she stopped medication, she is now on Trazodone prn. She needs refill   GERD: she is taking medication daily and symptoms are controlled . Unchanged   Anxiety:GAD7 is .  Discussed SSRI's but she prefers holding off for now, she is on hydroxizine, occasionally takes alprazolam. We gave her 30 6 months ago and she still has 22 pills left at home. Discussed Buspirone but she wants to hold off for now. 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   HTN: bp at home has been stable in the 120's/130's. No chest pain , palpitation . Currently not on medication   Prediabetes : no polyphagia, polydipsia or polyuria.Unchanged   Patient Active Problem List   Diagnosis Date Noted  . Vitamin D deficiency 01/10/2018  . S/P laparoscopic sleeve gastrectomy with hiatal hernia repair Nov 2018 02/28/2017  . Hyperlipidemia 06/25/2016  . Metabolic syndrome 11/19/2015  . Lupus anticoagulant positive 11/19/2015  . Elevated C-reactive protein 07/08/2015  . Perimenopause 05/28/2015  . Morbid obesity (HCC) 03/11/2015  . Gastro-esophageal reflux disease without esophagitis 03/11/2015  . Anxiety 03/11/2015  . Insomnia, persistent 03/11/2015  . Osteoarthritis of both knees 03/11/2015  . Episcleritis 03/11/2015  . History of fracture of tibia 05/23/2014  . History of sprain of ankle 05/23/2014  . S/P right knee arthroscopy 12/28/2011  . Mononeuritis leg 07/17/2011  . Night muscle spasms 07/17/2011    Past Surgical History:  Procedure Laterality Date  . CESAREAN SECTION  1988  . CHONDROPLASTY  12/24/2011   Procedure: CHONDROPLASTY;  Surgeon: Duffy Rhody  Vela Prose, MD;  Location: [facility];  Service: Orthopedics;  Laterality: Right;  . DILATION AND CURETTAGE OF UTERUS     "more than 1"  . FRACTURE SURGERY    . INTRAUTERINE DEVICE INSERTION  ~ 2016  . LAPAROSCOPIC GASTRIC SLEEVE RESECTION N/A 02/28/2017   Procedure: LAPAROSCOPIC GASTRIC SLEEVE RESECTION, UPPER ENDOSCOPY WITH HIATAL HERNIA REPAIR;  Surgeon: Johnathan Hausen, MD;  Location: [facility];  Service: General;  Laterality:  N/A;  . TIBIA IM NAIL INSERTION Right 05/23/2014   Procedure: INTRAMEDULLARY (IM) NAIL RIGHT TIBIA;  Surgeon: Alta Corning, MD;  Location: [facility];  Service: Orthopedics;  Laterality: Right;  . VENTRAL HERNIA REPAIR  02/28/2017    Family History  Problem Relation Age of Onset  . Hypertension Mother   . Hypertension Father   . Diabetes Father   . Stroke Father   . Aortic dissection Father   . Insomnia Sister   . Hypertension Sister   . Diabetes Sister   . Heart failure Maternal Grandfather   . Diabetes Paternal Grandfather   . Heart attack Paternal Grandfather     Social History   Tobacco Use  . Smoking status: Never Smoker  . Smokeless tobacco: Never Used  Substance Use Topics  . Alcohol use: Yes    Alcohol/week: 1.0 standard drinks    Types: 1 Shots of liquor per week    Current Outpatient Medications:  .  ALPRAZolam (XANAX) 0.5 MG tablet, TAKE HALF TO ONE TABLET BY MOUTH EVERY 4 HOURS AS NEEDED FOR ANXIETY, Disp: 42 tablet, Rfl: 0 .  azelastine (ASTELIN) 0.1 % nasal spray, Place 1 spray into both nostrils 2 (two) times daily. Use in each nostril as directed, Disp: 30 mL, Rfl: 2 .  cetirizine (ZYRTEC) 10 MG chewable tablet, Chew 1 tablet (10 mg total) by mouth daily., Disp: 20 tablet, Rfl: 0 .  cyclobenzaprine (FLEXERIL) 10 MG tablet, Take 1 tablet (10 mg total) by mouth at bedtime., Disp: 30 tablet, Rfl: 0 .  diclofenac sodium (VOLTAREN) 1 % GEL, Apply 2 g topically at bedtime as needed (for pain)., Disp: 100 g, Rfl: 1 .  hydrOXYzine (ATARAX/VISTARIL) 10 MG tablet, Take 1 tablet (10 mg total) by mouth 3 (three) times daily as needed for anxiety., Disp: 90 tablet, Rfl: 0 .  levocetirizine (XYZAL) 5 MG tablet, Take 1 tablet (5 mg total) by mouth every evening., Disp: 30 tablet, Rfl: 1 .  Multiple Vitamins-Minerals (MULTIVITAMIN PO), Take 1 tablet by mouth daily., Disp: , Rfl:  .  pantoprazole (PROTONIX) 40 MG tablet, Take 1 tablet (40 mg total) by mouth daily., Disp: 90 tablet,  Rfl: 1 .  traZODone (DESYREL) 50 MG tablet, Take 1 tablet (50 mg total) by mouth at bedtime as needed for sleep., Disp: 90 tablet, Rfl: 1 .  Vitamin D, Ergocalciferol, (DRISDOL) 1.25 MG (50000 UT) CAPS capsule, Take 1 capsule (50,000 Units total) by mouth 2 (two) times a week., Disp: 24 capsule, Rfl: 1  Allergies  Allergen Reactions  . Adhesive [Tape] Itching  . Ambien [Zolpidem Tartrate]     Sleep walking   . Latex Itching  . Morphine And Related Rash    I personally reviewed active problem list, medication list, allergies, social history with the patient/caregiver today.   ROS  Ten systems reviewed and is negative except as mentioned in HPI   Objective  Virtual encounter, vitals not obtained.  Body mass index is 40.34 kg/m.  Physical Exam  Awake, alert and oriented  PHQ2/9: Depression screen [facility] 2/9 08/03/2019 01/26/2019 07/27/2018 01/27/2018 12/02/2017  Decreased Interest 0 0 0 0 0  Down, Depressed, Hopeless 0 0 0 0 0  PHQ - 2 Score 0 0 0 0 0  Altered sleeping 0 1 0 0 0  Tired, decreased energy 0 1 0 0 0  Change in appetite 0 0 0 0 0  Feeling bad or failure about yourself  0 0 0 0 0  Trouble concentrating 0 0 0 0 0  Moving slowly or fidgety/restless 0 0 0 0 0  Suicidal thoughts 0 0 0 0 0  PHQ-9 Score 0 2 0 0 0  Difficult doing work/chores - Not difficult at all - Not difficult at all Not difficult at all   PHQ-2/9 Result is negative.    Fall Risk: Fall Risk  08/03/2019 01/26/2019 07/27/2018 01/27/2018 12/02/2017  Falls in the past year? 0 0 0 No No  Number falls in past yr: 0 0 0 - -  Injury with Fall? 0 0 0 - -  Comment - - - - -    GAD 7 : Generalized Anxiety Score 08/03/2019 07/27/2018  Nervous, Anxious, on Edge 1 3  Control/stop worrying 1 0  Worry too much - different things 1 0  Trouble relaxing 3 2  Restless 3 0  Easily annoyed or irritable 1 1  Afraid - awful might happen 3 1  Total GAD 7 Score 13 7  Anxiety Difficulty Somewhat difficult Not difficult at all       Assessment & Plan   1. GAD (generalized anxiety disorder)  - hydrOXYzine (ATARAX/VISTARIL) 10 MG tablet; Take 1 tablet (10 mg total) by mouth 3 (three) times daily as needed for anxiety.  Dispense: 90 tablet; Refill: 0  2. Primary insomnia  - traZODone (DESYREL) 50 MG tablet; Take 1 tablet (50 mg total) by mouth at bedtime as needed for sleep.  Dispense: 90 tablet; Refill: 1  3. Vitamin D deficiency   4. Hypertension, benign  Not on medication at this time, we will continue to monitor for now  5. Gastro-esophageal reflux disease without esophagitis  - pantoprazole (PROTONIX) 40 MG tablet; Take 1 tablet (40 mg total) by mouth daily.  Dispense: 90 tablet; Refill: 1  6. Metabolic syndrome   7. Morbid obesity, unspecified obesity type [facility])  Discussed with the patient the risk posed by an increased BMI. Discussed importance of portion control, calorie counting and at least 150 minutes of physical activity weekly. Avoid sweet beverages and drink more water. Eat at least 6 servings of fruit and vegetables daily   8. Dyslipidemia   9. Night muscle spasms  - cyclobenzaprine (FLEXERIL) 10 MG tablet; Take 1 tablet (10 mg total) by mouth at bedtime.  Dispense: 90 tablet; Refill: 1  I discussed the assessment and treatment plan with the patient. The patient was provided an opportunity to ask questions and all were answered. The patient agreed with the plan and demonstrated an understanding of the instructions.   The patient was advised to call back or seek an in-person evaluation if the symptoms worsen or if the condition fails to improve as anticipated.  I provided 25  minutes of non-face-to-face time during this encounter.  Ruel Favors, MD

## 2019-09-19 ENCOUNTER — Encounter (HOSPITAL_COMMUNITY): Payer: Self-pay

## 2019-10-19 ENCOUNTER — Other Ambulatory Visit: Payer: Self-pay | Admitting: Family Medicine

## 2019-10-19 DIAGNOSIS — F411 Generalized anxiety disorder: Secondary | ICD-10-CM

## 2019-10-19 NOTE — Telephone Encounter (Signed)
Requested medication (s) are due for refill today -yes  Requested medication (s) are on the active medication list -yes  Future visit scheduled -yes  Last refill: 02/27/19  Notes to clinic: Request for non delegated Rx  Requested Prescriptions  Pending Prescriptions Disp Refills   ALPRAZolam (XANAX) 0.5 MG tablet [Pharmacy Med Name: ALPRAZOLAM 0.5 MG TABLET] 42 tablet     Sig: TAKE HALF TO ONE TABLET BY MOUTH EVERY 4 HOURS AS NEEDED FOR ANXIETY      Not Delegated - Psychiatry:  Anxiolytics/Hypnotics Failed - 10/19/2019  9:48 AM      Failed - This refill cannot be delegated      Failed - Urine Drug Screen completed in last 360 days.      Passed - Valid encounter within last 6 months    Recent Outpatient Visits           2 months ago Vitamin D deficiency   [facility], Danna Hefty, MD   8 months ago Morbid obesity, unspecified obesity type [facility])   [facility] Alba Cory, MD   1 year ago Hypertension, benign   [facility] Alba Cory, MD   1 year ago Primary insomnia   [facility] Alba Cory, MD   1 year ago Primary insomnia   [facility] Alba Cory, MD       Future Appointments             In 3 months Carlynn Purl, Danna Hefty, MD [facility]                Requested Prescriptions  Pending Prescriptions Disp Refills   ALPRAZolam (XANAX) 0.5 MG tablet [Pharmacy Med Name: ALPRAZOLAM 0.5 MG TABLET] 42 tablet     Sig: TAKE HALF TO ONE TABLET BY MOUTH EVERY 4 HOURS AS NEEDED FOR ANXIETY      Not Delegated - Psychiatry:  Anxiolytics/Hypnotics Failed - 10/19/2019  9:48 AM      Failed - This refill cannot be delegated      Failed - Urine Drug Screen completed in last 360 days.      Passed - Valid encounter within last 6 months    Recent Outpatient Visits           2 months ago Vitamin D deficiency   [facility], Danna Hefty, MD   8 months ago Morbid obesity, unspecified obesity type [facility])   [facility] Alba Cory, MD   1 year ago Hypertension, benign   [facility] Alba Cory, MD   1 year ago Primary insomnia   [facility] Alba Cory, MD   1 year ago Primary insomnia   [facility] Alba Cory, MD       Future Appointments             In 3 months Carlynn Purl, Danna Hefty, MD [facility], [facility]

## 2020-01-26 ENCOUNTER — Other Ambulatory Visit: Payer: Self-pay | Admitting: Family Medicine

## 2020-01-26 DIAGNOSIS — F411 Generalized anxiety disorder: Secondary | ICD-10-CM

## 2020-01-26 NOTE — Telephone Encounter (Signed)
Requested medication (s) are due for refill today: yes  Requested medication (s) are on the active medication list: yes  Last refill:  10/21/19  Future visit scheduled: yes  Notes to clinic:  med not delegated to NT to refill   Requested Prescriptions  Pending Prescriptions Disp Refills   ALPRAZolam (XANAX) 0.5 MG tablet [Pharmacy Med Name: ALPRAZOLAM 0.5 MG TABLET] 42 tablet 0    Sig: TAKE HALF TO ONE TABLET BY MOUTH EVERY 4 HOURS AS NEEDED FOR ANXIETY      Not Delegated - Psychiatry:  Anxiolytics/Hypnotics Failed - 01/26/2020 11:58 AM      Failed - This refill cannot be delegated      Failed - Urine Drug Screen completed in last 360 days.      Passed - Valid encounter within last 6 months    Recent Outpatient Visits           5 months ago Vitamin D deficiency   [facility] Alba Cory, MD   1 year ago Morbid obesity, unspecified obesity type [facility])   [facility] Al[facility], MD   1 year ago Hypertension, benign   [facility] Alba Cory, MD   1 year ago Primary insomnia   [facility] Alba Cory, MD   2 years ago Primary insomnia   [facility] Alba Cory, MD       Future Appointments             In 1 week Alba Cory, MD [facility], [facility]

## 2020-01-30 ENCOUNTER — Other Ambulatory Visit: Payer: Self-pay | Admitting: Family Medicine

## 2020-01-30 DIAGNOSIS — F5101 Primary insomnia: Secondary | ICD-10-CM

## 2020-01-30 DIAGNOSIS — M62838 Other muscle spasm: Secondary | ICD-10-CM

## 2020-01-30 NOTE — Telephone Encounter (Signed)
Requested Prescriptions  Pending Prescriptions Disp Refills  . traZODone (DESYREL) 50 MG tablet [Pharmacy Med Name: TRAZODONE 50 MG TABLET] 90 tablet 0    Sig: TAKE 1 TABLET (50 MG TOTAL) BY MOUTH AT BEDTIME AS NEEDED FOR SLEEP.     Psychiatry: Antidepressants - Serotonin Modulator Passed - 01/30/2020  1:27 AM      Passed - Valid encounter within last 6 months    Recent Outpatient Visits          6 months ago Vitamin D deficiency   [facility], MD   1 year ago Morbid obesity, unspecified obesity type [facility])   [facility] Al[facility] Cory, MD   1 year ago Hypertension, benign   [facility] Alba Cory, MD   2 years ago Primary insomnia   [facility] Alba Cory, MD   2 years ago Primary insomnia   [facility] Alba Cory, MD      Future Appointments            In 6 days Alba Cory, MD [facility]           . cyclobenzaprine (FLEXERIL) 10 MG tablet [Pharmacy Med Name: CYCLOBENZAPRINE 10 MG TABLET] 90 tablet 1    Sig: TAKE 1 TABLET BY MOUTH EVERYDAY AT BEDTIME     Not Delegated - Analgesics:  Muscle Relaxants Failed - 01/30/2020  1:27 AM      Failed - This refill cannot be delegated      Passed - Valid encounter within last 6 months    Recent Outpatient Visits          6 months ago Vitamin D deficiency   [facility] Al[facility] Cory, MD   1 year ago Morbid obesity, unspecified obesity type [facility])   [facility] Alba Cory, MD   1 year ago Hypertension, benign   [facility] Alba Cory, MD   2 years ago Primary insomnia   [facility] Alba Cory, MD   2 years ago Primary insomnia   [facility] Alba Cory, MD      Future Appointments            In 6 days Alba Cory, MD [place]

## 2020-01-30 NOTE — Telephone Encounter (Signed)
Requested medications are due for refill today?  Yes - This medication refill cannot be delegated.   Requested medications are on active medication list?  Yes  Last Refill:   08/03/2019  # 90 with one refill   Future visit scheduled?  Yes in 6 days.    Notes to Clinic:  The refill cannot be delegated.

## 2020-02-01 ENCOUNTER — Other Ambulatory Visit: Payer: Self-pay | Admitting: Family Medicine

## 2020-02-01 DIAGNOSIS — K219 Gastro-esophageal reflux disease without esophagitis: Secondary | ICD-10-CM

## 2020-02-05 ENCOUNTER — Ambulatory Visit: Payer: Managed Care, Other (non HMO) | Admitting: Family Medicine

## 2020-02-05 NOTE — Progress Notes (Deleted)
Name: Sharon Owen   MRN: [id number]    DOB: 12/29/1965   Date:02/05/2020       Progress Note  Subjective  Chief Complaint  Follow up   HPI Obesity/s/p bariatric surgery: she states she has been obesity since she turned 54 years old. She was in the 160's lbs before she had children. She has tried weight watchers, Adipex, Qsymia ( raised her bp ), Atkins diet. She had sleeve surgery on 02/28/2017 her weight was 270 's, she went down  65 lbs and as around  205 lbs, however with COVID and stress, lack of exercise and weight has gone up to 225 lbs. She states she is looking forward to the Summer so she can be more active. 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 No significant joint problems since.She still feels stiff in am's but no rashes, uses voltaren prn for joint pains . She does not need refills at this time   Insomnia; shewas taking ambien, but daughter found her sleep walking one night and she stopped medication, she is now on Trazodone prn. She needs refill   GERD: she is taking medication daily and symptoms are controlled . Unchanged   Anxiety:GAD7 is . Discussed SSRI's but she prefers holding off for now, she is on hydroxizine, occasionally takes alprazolam. We gave her 30 6 months ago and she still has 22 pills left at home. Discussed Buspirone but she wants to hold off for now. 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   HTN: bp at home has been stable in the 120's/130's. No chest pain , palpitation . Currently not on medication   Prediabetes : no polyphagia, polydipsia or polyuria.Unchanged  *** Patient Active Problem List   Diagnosis Date Noted  . Vitamin D deficiency 01/10/2018  . S/P laparoscopic sleeve gastrectomy  with hiatal hernia repair Nov 2018 02/28/2017  . Hyperlipidemia 06/25/2016  . Metabolic syndrome 11/19/2015  . Lupus anticoagulant positive 11/19/2015  . Elevated C-reactive protein 07/08/2015  . Perimenopause 05/28/2015  . Morbid obesity (HCC) 03/11/2015  . Gastro-esophageal reflux disease without esophagitis 03/11/2015  . Anxiety 03/11/2015  . Insomnia, persistent 03/11/2015  . Osteoarthritis of both knees 03/11/2015  . Episcleritis 03/11/2015  . History of fracture of tibia 05/23/2014  . History of sprain of ankle 05/23/2014  . S/P right knee arthroscopy 12/28/2011  . Mononeuritis leg 07/17/2011  . Night muscle spasms 07/17/2011    Past Surgical History:  Procedure Laterality Date  . CESAREAN SECTION  1988  . CHONDROPLASTY  12/24/2011   Procedure: CHONDROPLASTY;  Surgeon: Vickki Hearing, MD;  Location: [facility];  Service: Orthopedics;  Laterality: Right;  . DILATION AND CURETTAGE OF UTERUS     "more than 1"  . FRACTURE SURGERY    . INTRAUTERINE DEVICE INSERTION  ~ 2016  . LAPAROSCOPIC GASTRIC SLEEVE RESECTION N/A 02/28/2017   Procedure: LAPAROSCOPIC GASTRIC SLEEVE RESECTION, UPPER ENDOSCOPY WITH HIATAL HERNIA REPAIR;  Surgeon: Luretha Murphy, MD;  [facility]: [facility];  Service: General;  Laterality: N/A;  . TIBIA IM NAIL INSERTION Right 05/23/2014   Procedure: INTRAMEDULLARY (IM) NAIL RIGHT TIBIA;  Surgeon: Harvie Junior, MD;  [facility];  Service: Orthopedics;  Laterality: Right;  . VENTRAL HERNIA REPAIR  02/28/2017    Family  History  Problem Relation Age of Onset  . Hypertension Mother   . Hypertension Father   . Diabetes Father   . Stroke Father   . Aortic dissection Father   . Insomnia Sister   . Hypertension Sister   . Diabetes Sister   . Heart failure Maternal Grandfather   . Diabetes Paternal Grandfather   . Heart attack Paternal Grandfather     Social History   Tobacco Use  . Smoking status: Never Smoker  . Smokeless tobacco: Never Used  Substance  Use Topics  . Alcohol use: Yes    Alcohol/week: 1.0 standard drink    Types: 1 Shots of liquor per week     Current Outpatient Medications:  .  ALPRAZolam (XANAX) 0.5 MG tablet, TAKE HALF TO ONE TABLET BY MOUTH EVERY 4 HOURS AS NEEDED FOR ANXIETY, Disp: 42 tablet, Rfl: 0 .  azelastine (ASTELIN) 0.1 % nasal spray, Place 1 spray into both nostrils 2 (two) times daily. Use in each nostril as directed, Disp: 30 mL, Rfl: 2 .  cetirizine (ZYRTEC) 10 MG chewable tablet, Chew 1 tablet (10 mg total) by mouth daily., Disp: 20 tablet, Rfl: 0 .  cyclobenzaprine (FLEXERIL) 10 MG tablet, TAKE 1 TABLET BY MOUTH EVERYDAY AT BEDTIME, Disp: 90 tablet, Rfl: 0 .  diclofenac sodium (VOLTAREN) 1 % GEL, Apply 2 g topically at bedtime as needed (for pain)., Disp: 100 g, Rfl: 1 .  hydrOXYzine (ATARAX/VISTARIL) 10 MG tablet, Take 1 tablet (10 mg total) by mouth 3 (three) times daily as needed for anxiety., Disp: 90 tablet, Rfl: 0 .  levocetirizine (XYZAL) 5 MG tablet, Take 1 tablet (5 mg total) by mouth every evening., Disp: 30 tablet, Rfl: 1 .  Multiple Vitamins-Minerals (MULTIVITAMIN PO), Take 1 tablet by mouth daily., Disp: , Rfl:  .  pantoprazole (PROTONIX) 40 MG tablet, TAKE 1 TABLET BY MOUTH EVERY DAY, Disp: 90 tablet, Rfl: 1 .  traZODone (DESYREL) 50 MG tablet, TAKE 1 TABLET (50 MG TOTAL) BY MOUTH AT BEDTIME AS NEEDED FOR SLEEP., Disp: 90 tablet, Rfl: 0 .  Vitamin D, Ergocalciferol, (DRISDOL) 1.25 MG (50000 UT) CAPS capsule, Take 1 capsule (50,000 Units total) by mouth 2 (two) times a week., Disp: 24 capsule, Rfl: 1  Allergies  Allergen Reactions  . Adhesive [Tape] Itching  . Ambien [Zolpidem Tartrate]     Sleep walking   . Latex Itching  . Morphine And Related Rash    I personally reviewed {Reviewed:[id number]} with the patient/caregiver today.   ROS  ***  Objective  There were no vitals filed for this visit.  There is no height or weight on file to calculate BMI.  Physical Exam ***  No results  found for this or any previous visit (from the past 2160 hour(s)).  Diabetic Foot Exam: Diabetic Foot Exam - Simple   No data filed     ***  PHQ2/9: Depression screen [facility] 2/9 08/03/2019 01/26/2019 07/27/2018 01/27/2018 12/02/2017  Decreased Interest 0 0 0 0 0  Down, Depressed, Hopeless 0 0 0 0 0  PHQ - 2 Score 0 0 0 0 0  Altered sleeping 0 1 0 0 0  Tired, decreased energy 0 1 0 0 0  Change in appetite 0 0 0 0 0  Feeling bad or failure about yourself  0 0 0 0 0  Trouble concentrating 0 0 0 0 0  Moving slowly or fidgety/restless 0 0 0 0 0  Suicidal thoughts 0 0 0 0 0  PHQ-9  Score 0 2 0 0 0  Difficult doing work/chores - Not difficult at all - Not difficult at all Not difficult at all    phq 9 is {gen pos [id number]} ***  Fall Risk: Fall Risk  08/03/2019 01/26/2019 07/27/2018 01/27/2018 12/02/2017  Falls in the past year? 0 0 0 No No  Number falls in past yr: 0 0 0 - -  Injury with Fall? 0 0 0 - -  Comment - - - - -   ***   Functional Status Survey:   ***   Assessment & Plan  *** There are no diagnoses linked to this encounter.

## 2020-03-24 ENCOUNTER — Other Ambulatory Visit: Payer: Self-pay | Admitting: Family Medicine

## 2020-03-24 DIAGNOSIS — Z1231 Encounter for screening mammogram for malignant neoplasm of breast: Secondary | ICD-10-CM

## 2020-04-27 ENCOUNTER — Telehealth: Payer: Self-pay | Admitting: Family Medicine

## 2020-04-27 DIAGNOSIS — M62838 Other muscle spasm: Secondary | ICD-10-CM

## 2020-04-27 DIAGNOSIS — F5101 Primary insomnia: Secondary | ICD-10-CM

## 2020-04-28 ENCOUNTER — Other Ambulatory Visit: Payer: Self-pay

## 2020-04-29 NOTE — Telephone Encounter (Signed)
Pt states she is ok on meds right now and will call back to schedule when she can

## 2020-07-15 ENCOUNTER — Other Ambulatory Visit: Payer: Self-pay | Admitting: Family Medicine

## 2020-07-15 DIAGNOSIS — F5101 Primary insomnia: Secondary | ICD-10-CM

## 2020-07-15 NOTE — Telephone Encounter (Signed)
Requested medication (s) are due for refill today: no  Requested medication (s) are on the active medication list: yes  Last refill: 01/31/2020  Future visit scheduled: no  Notes to clinic:  this script is expired  Review for continued use and refill    Requested Prescriptions  Pending Prescriptions Disp Refills   traZODone (DESYREL) 50 MG tablet [Pharmacy Med Name: TRAZODONE 50 MG TABLET] 90 tablet 0    Sig: TAKE 1 TABLET (50 MG TOTAL) BY MOUTH AT BEDTIME AS NEEDED FOR SLEEP.      Psychiatry: Antidepressants - Serotonin Modulator Failed - 07/15/2020  8:16 AM      Failed - Valid encounter within last 6 months    Recent Outpatient Visits           11 months ago Vitamin D deficiency   [facility] Cory, MD   1 year ago Morbid obesity, unspecified obesity type [facility])   [facility] Alba Cory, MD   1 year ago Hypertension, benign   [facility], MD   2 years ago Primary insomnia   [facility] Alba Cory, MD   2 years ago Primary insomnia   [facility] Alba Cory, MD

## 2020-08-19 ENCOUNTER — Other Ambulatory Visit: Payer: Self-pay | Admitting: Family Medicine

## 2020-08-19 DIAGNOSIS — F411 Generalized anxiety disorder: Secondary | ICD-10-CM

## 2020-08-19 NOTE — Telephone Encounter (Signed)
Last seen: by Dr Carlynn Purl 4.9.2021 Next visit: no appt scheduled

## 2020-08-19 NOTE — Telephone Encounter (Signed)
Requested medication (s) are due for refill today:   Provider to review  Requested medication (s) are on the active medication list:   Yes  Future visit scheduled:   No   Last ordered: 10/21/2019 #42, 0 refills  Non delegated refill    Requested Prescriptions  Pending Prescriptions Disp Refills   ALPRAZolam (XANAX) 0.5 MG tablet [Pharmacy Med Name: ALPRAZOLAM 0.5 MG TABLET] 42 tablet     Sig: TAKE HALF TO ONE TABLET BY MOUTH EVERY 4 HOURS AS NEEDED FOR ANXIETY      Not Delegated - Psychiatry:  Anxiolytics/Hypnotics Failed - 08/19/2020  1:17 PM      Failed - This refill cannot be delegated      Failed - Urine Drug Screen completed in last 360 days      Failed - Valid encounter within last 6 months    Recent Outpatient Visits           1 year ago Vitamin D deficiency   [facility]   1 year ago Morbid obesity, unspecified obesity type [facility])   [facility] Al[facility] Cory, MD   2 years ago Hypertension, benign   [facility] Alba Cory, MD   2 years ago Primary insomnia   [facility] Alba Cory, MD   2 years ago Primary insomnia   [facility] Alba Cory, MD

## 2020-08-27 ENCOUNTER — Other Ambulatory Visit: Payer: Self-pay | Admitting: Family Medicine

## 2020-08-27 DIAGNOSIS — F5101 Primary insomnia: Secondary | ICD-10-CM

## 2020-08-27 NOTE — Telephone Encounter (Signed)
Home # kept dropping, called her cell phone and had to leave a message for pt to call and schedule an appt with Dr Carlynn Purl. Pts last appt was 08/03/2019, pt no showed in october

## 2020-08-27 NOTE — Telephone Encounter (Signed)
Requested medication (s) are due for refill today: no  Requested medication (s) are on the active medication list: yes  Last refill: 01/31/2020  Future visit scheduled: no  Notes to clinic: Patient has not schedule appt yet  Overdue for follow up    Requested Prescriptions  Pending Prescriptions Disp Refills   traZODone (DESYREL) 50 MG tablet [Pharmacy Med Name: TRAZODONE 50 MG TABLET] 90 tablet 0    Sig: TAKE 1 TABLET (50 MG TOTAL) BY MOUTH AT BEDTIME AS NEEDED FOR SLEEP.      Psychiatry: Antidepressants - Serotonin Modulator Failed - 08/27/2020  9:46 AM      Failed - Valid encounter within last 6 months    Recent Outpatient Visits           1 year ago Vitamin D deficiency   [facility], [facility]   1 year ago Morbid obesity, unspecified obesity type [facility])   [facility] Alba Cory, MD   2 years ago Hypertension, benign   [facility] Al[facility] Cory, MD   2 years ago Primary insomnia   [facility] Al[facility] Cory, MD   2 years ago Primary insomnia   [facility] Alba Cory, MD

## 2020-09-18 ENCOUNTER — Other Ambulatory Visit: Payer: Self-pay | Admitting: Family Medicine

## 2020-09-18 DIAGNOSIS — K219 Gastro-esophageal reflux disease without esophagitis: Secondary | ICD-10-CM

## 2020-09-30 ENCOUNTER — Encounter (HOSPITAL_COMMUNITY): Payer: Self-pay | Admitting: *Deleted

## 2020-10-18 ENCOUNTER — Other Ambulatory Visit: Payer: Self-pay | Admitting: Family Medicine

## 2020-10-18 DIAGNOSIS — K219 Gastro-esophageal reflux disease without esophagitis: Secondary | ICD-10-CM

## 2020-10-18 NOTE — Telephone Encounter (Signed)
Last RF: 09/18/20 #30 No future appointment. Sent pt message through MyChart for pt to call and make appt. Requested Prescriptions  Pending Prescriptions Disp Refills   pantoprazole (PROTONIX) 40 MG tablet [Pharmacy Med Name: PANTOPRAZOLE SOD DR 40 MG TAB] 30 tablet 0    Sig: TAKE 1 TABLET BY MOUTH EVERY DAY      Gastroenterology: Proton Pump Inhibitors Failed - 10/18/2020  2:30 PM      Failed - Valid encounter within last 12 months    Recent Outpatient Visits           1 year ago Vitamin D deficiency   [facility], MD   1 year ago Morbid obesity, unspecified obesity type [facility])   [facility], MD   2 years ago Hypertension, benign   [facility], MD   2 years ago Primary insomnia   [facility] Al[facility] Cory, MD   2 years ago Primary insomnia   [facility] Alba Cory, MD

## 2020-10-20 NOTE — Telephone Encounter (Signed)
Last seen 4.9.2021 no upcoming appt sch'd

## 2020-10-23 ENCOUNTER — Other Ambulatory Visit: Payer: Self-pay | Admitting: Family Medicine

## 2020-10-23 DIAGNOSIS — K219 Gastro-esophageal reflux disease without esophagitis: Secondary | ICD-10-CM

## 2021-06-09 ENCOUNTER — Other Ambulatory Visit: Payer: Self-pay

## 2021-09-25 ENCOUNTER — Encounter (HOSPITAL_COMMUNITY): Payer: Self-pay | Admitting: *Deleted

## 2021-12-30 ENCOUNTER — Ambulatory Visit: Payer: Self-pay | Admitting: Allergy & Immunology

## 2022-01-08 ENCOUNTER — Ambulatory Visit: Payer: Self-pay | Admitting: Allergy & Immunology

## 2022-01-25 NOTE — Progress Notes (Deleted)
New Patient Note  RE: ASHELY Owen MRN: [id number] DOB: 09-25-1965 Date of Office Visit: 01/26/2022  Consult requested by: Steele Sizer, MD Primary care provider: Steele Sizer, MD  Chief Complaint: No chief complaint on file.  History of Present Illness: I had the pleasure of seeing Sharon Owen for initial evaluation at the [facility] [address] [facility] [place] on 01/25/2022. She is a 56 y.o. female, who is referred here by Steele Sizer, MD for the evaluation of asthma.  She reports symptoms of *** chest tightness, shortness of breath, coughing, wheezing, nocturnal awakenings for *** years. Current medications include *** which help. She reports *** using aerochamber with inhalers. She tried the following inhalers: ***. Main triggers are ***allergies, infections, weather changes, smoke, exercise, pet exposure. In the last month, frequency of symptoms: ***x/week. Frequency of nocturnal symptoms: ***x/month. Frequency of SABA use: ***x/week. Interference with physical activity: ***. Sleep is ***disturbed. In the last 12 months, emergency room visits/urgent care visits/doctor office visits or hospitalizations due to respiratory issues: ***. In the last 12 months, oral steroids courses: ***. Lifetime history of hospitalization for respiratory issues: ***. Prior intubations: ***. Asthma was diagnosed at age *** by ***. History of pneumonia: ***. She was evaluated by allergist ***pulmonologist in the past. Smoking exposure: ***. Up to date with flu vaccine: ***. Up to date with pneumonia vaccine: ***. Up to date with COVID-19 vaccine: ***. Prior Covid-19 infection: ***. History of reflux: ***.  Assessment and Plan: Veta is a 56 y.o. female with: No problem-specific Assessment & Plan notes found for this encounter.  No follow-ups on file.  No orders of the defined types were placed in this encounter.  Lab Orders  No laboratory test(s) ordered today    Other allergy  screening: Asthma: {Blank single:19197::"yes","no"} Rhino conjunctivitis: {Blank single:19197::"yes","no"} Food allergy: {Blank single:19197::"yes","no"} Medication allergy: {Blank single:19197::"yes","no"} Hymenoptera allergy: {Blank single:19197::"yes","no"} Urticaria: {Blank single:19197::"yes","no"} Eczema:{Blank single:19197::"yes","no"} History of recurrent infections suggestive of immunodeficency: {Blank single:19197::"yes","no"}  Diagnostics: Spirometry:  Tracings reviewed. Her effort: {Blank single:19197::"Good reproducible efforts.","It was hard to get consistent efforts and there is a question as to whether this reflects a maximal maneuver.","Poor effort, data can not be interpreted."} FVC: ***L FEV1: ***L, ***% predicted FEV1/FVC ratio: ***% Interpretation: {Blank single:19197::"Spirometry consistent with mild obstructive disease","Spirometry consistent with moderate obstructive disease","Spirometry consistent with severe obstructive disease","Spirometry consistent with possible restrictive disease","Spirometry consistent with mixed obstructive and restrictive disease","Spirometry uninterpretable due to technique","Spirometry consistent with normal pattern","No overt abnormalities noted given today's efforts"}.  Please see scanned spirometry results for details.  Skin Testing: {Blank single:19197::"Select foods","Environmental allergy panel","Environmental allergy panel and select foods","Food allergy panel","None","Deferred due to recent antihistamines use"}. *** Results discussed with patient/family.   Past Medical History: Patient Active Problem List   Diagnosis Date Noted  . Vitamin D deficiency 01/10/2018  . S/P laparoscopic sleeve gastrectomy with hiatal hernia repair Nov 2018 02/28/2017  . Hyperlipidemia 06/25/2016  . Metabolic syndrome [id number]  . Lupus anticoagulant positive 11/19/2015  . Elevated C-reactive protein 07/08/2015  . Perimenopause 05/28/2015  .  Morbid obesity ([place]) 03/11/2015  . Gastro-esophageal reflux disease without esophagitis 03/11/2015  . Anxiety 03/11/2015  . Insomnia, persistent 03/11/2015  . Osteoarthritis of both knees 03/11/2015  . Episcleritis 03/11/2015  . History of fracture of tibia 05/23/2014  . History of sprain of ankle 05/23/2014  . S/P right knee arthroscopy 12/28/2011  . Mononeuritis leg 07/17/2011  . Night muscle spasms 07/17/2011   Past Medical History:  Diagnosis Date  . Anxiety   .  Chronic osteoarthritis    "knees" (06/24/2016)  . Complication of anesthesia    "I'm hard to wake up" (06/24/2016)  . Elevated blood pressure   . Episclerotitis, right    "of eye"  . GERD (gastroesophageal reflux disease)   . Headache    "monthly at least" (06/24/2016)  . Hypertension   . Insomnia   . Metabolic syndrome   . Morbid obesity ([place])    Past Surgical History: Past Surgical History:  Procedure Laterality Date  . CESAREAN SECTION  1988  . CHONDROPLASTY  12/24/2011   Procedure: CHONDROPLASTY;  Surgeon: Carole Civil, MD;  Location: [facility];  Service: Orthopedics;  Laterality: Right;  . DILATION AND CURETTAGE OF UTERUS     "more than 1"  . FRACTURE SURGERY    . INTRAUTERINE DEVICE INSERTION  ~ 2016  . LAPAROSCOPIC GASTRIC SLEEVE RESECTION N/A 02/28/2017   Procedure: LAPAROSCOPIC GASTRIC SLEEVE RESECTION, UPPER ENDOSCOPY WITH HIATAL HERNIA REPAIR;  Surgeon: Johnathan Hausen, MD;  Location: [facility];  Service: General;  Laterality: N/A;  . TIBIA IM NAIL INSERTION Right 05/23/2014   Procedure: INTRAMEDULLARY (IM) NAIL RIGHT TIBIA;  Surgeon: Alta Corning, MD;  Location: [facility];  Service: Orthopedics;  Laterality: Right;  . VENTRAL HERNIA REPAIR  02/28/2017   Medication List:  Current Outpatient Medications  Medication Sig Dispense Refill  . ALPRAZolam (XANAX) 0.5 MG tablet TAKE HALF TO ONE TABLET BY MOUTH EVERY 4 HOURS AS NEEDED FOR ANXIETY 30 tablet 0  . azelastine (ASTELIN) 0.1 % nasal spray Place 1 spray  into both nostrils 2 (two) times daily. Use in each nostril as directed 30 mL 2  . cetirizine (ZYRTEC) 10 MG chewable tablet Chew 1 tablet (10 mg total) by mouth daily. 20 tablet 0  . cyclobenzaprine (FLEXERIL) 10 MG tablet TAKE 1 TABLET BY MOUTH EVERYDAY AT BEDTIME 90 tablet 0  . diclofenac sodium (VOLTAREN) 1 % GEL Apply 2 g topically at bedtime as needed (for pain). 100 g 1  . hydrOXYzine (ATARAX/VISTARIL) 10 MG tablet Take 1 tablet (10 mg total) by mouth 3 (three) times daily as needed for anxiety. 90 tablet 0  . levocetirizine (XYZAL) 5 MG tablet Take 1 tablet (5 mg total) by mouth every evening. 30 tablet 1  . Multiple Vitamins-Minerals (MULTIVITAMIN PO) Take 1 tablet by mouth daily.    . pantoprazole (PROTONIX) 40 MG tablet TAKE 1 TABLET BY MOUTH EVERY DAY 30 tablet 0  . traZODone (DESYREL) 50 MG tablet TAKE 1 TABLET (50 MG TOTAL) BY MOUTH AT BEDTIME AS NEEDED FOR SLEEP. 90 tablet 0  . Vitamin D, Ergocalciferol, (DRISDOL) 1.25 MG (50000 UT) CAPS capsule Take 1 capsule (50,000 Units total) by mouth 2 (two) times a week. 24 capsule 1   No current facility-administered medications for this visit.   Allergies: Allergies  Allergen Reactions  . Adhesive [Tape] Itching  . Ambien [Zolpidem Tartrate]     Sleep walking   . Latex Itching  . Morphine And Related Rash   Social History: Social History   Socioeconomic History  . Marital status: Married    Spouse name: Albertina Parr   . Number of children: 3  . Years of education: Not on file  . Highest education level: Not on file  Occupational History  . Not on file  Tobacco Use  . Smoking status: Never  . Smokeless tobacco: Never  Vaping Use  . Vaping Use: Never used  Substance and Sexual Activity  . Alcohol use: Yes  Alcohol/week: 1.0 standard drink of alcohol    Types: 1 Shots of liquor per week  . Drug use: No  . Sexual activity: Yes    Birth control/protection: I.U.D.    Comment: Mirena  Other Topics Concern  . Not on file   Social History Narrative   Married   She is a travel Marine scientist now    Daughter joined the [employer] and is in [place] Daughter is graduating with clinical degree in research/pharmacy and is starting her new life   Oldest daughter is still at home - she had a baby - born 01/2019   Social Determinants of Health   Financial Resource Strain: Medium Risk (01/27/2018)   Overall Financial Resource Strain ([place])   . Difficulty of Paying Living Expenses: Somewhat hard  Food Insecurity: No Food Insecurity (01/27/2018)   Hunger Vital Sign   . Worried About Charity fundraiser in the Last Year: Never true   . Ran Out of Food in the Last Year: Never true  Transportation Needs: No Transportation Needs (01/27/2018)   PRAPARE - Transportation   . Lack of Transportation (Medical): No   . Lack of Transportation (Non-Medical): No  Physical Activity: Sufficiently Active (01/27/2018)   Exercise Vital Sign   . Days of Exercise per Week: 2 days   . Minutes of Exercise per Session: 90 min  Stress: Stress Concern Present (01/27/2018)   [place]   . Feeling of Stress : To some extent  Social Connections: Socially Integrated (01/27/2018)   Social Connection and Isolation Panel [NHANES]   . Frequency of Communication with Friends and Family: More than three times a week   . Frequency of Social Gatherings with Friends and Family: More than three times a week   . Attends Religious Services: More than 4 times per year   . Active Member of Clubs or Organizations: Yes   . Attends Archivist Meetings: More than 4 times per year   . Marital Status: Married   Lives in a ***. Smoking: *** Occupation: ***  Environmental HistoryFreight forwarder in the house: Estate agent in the family room: {Blank single:19197::"yes","no"} Carpet in the bedroom: {Blank single:19197::"yes","no"} Heating: {Blank  single:19197::"electric","gas","heat pump"} Cooling: {Blank single:19197::"central","window","heat pump"} Pet: {Blank single:19197::"yes ***","no"}  Family History: Family History  Problem Relation Age of Onset  . Hypertension Mother   . Hypertension Father   . Diabetes Father   . Stroke Father   . Aortic dissection Father   . Insomnia Sister   . Hypertension Sister   . Diabetes Sister   . Heart failure Maternal Grandfather   . Diabetes Paternal Grandfather   . Heart attack Paternal Grandfather    Problem                               Relation Asthma                                   *** Eczema                                *** Food allergy                          ***  Allergic rhino conjunctivitis     ***  Review of Systems  Constitutional:  Negative for appetite change, chills, fever and unexpected weight change.  HENT:  Negative for congestion and rhinorrhea.   Eyes:  Negative for itching.  Respiratory:  Negative for cough, chest tightness, shortness of breath and wheezing.   Cardiovascular:  Negative for chest pain.  Gastrointestinal:  Negative for abdominal pain.  Genitourinary:  Negative for difficulty urinating.  Skin:  Negative for rash.  Neurological:  Negative for headaches.   Objective: There were no vitals taken for this visit. There is no height or weight on file to calculate BMI. Physical Exam Vitals and nursing note reviewed.  Constitutional:      Appearance: Normal appearance. She is well-developed.  HENT:     Head: Normocephalic and atraumatic.     Right Ear: Tympanic membrane and external ear normal.     Left Ear: Tympanic membrane and external ear normal.     Nose: Nose normal.     Mouth/Throat:     Mouth: Mucous membranes are moist.     Pharynx: Oropharynx is clear.  Eyes:     Conjunctiva/sclera: Conjunctivae normal.  Cardiovascular:     Rate and Rhythm: Normal rate and regular rhythm.     Heart sounds: Normal heart sounds. No murmur  heard.    No friction rub. No gallop.  Pulmonary:     Effort: Pulmonary effort is normal.     Breath sounds: Normal breath sounds. No wheezing, rhonchi or rales.  Musculoskeletal:     Cervical back: Neck supple.  Skin:    General: Skin is warm.     Findings: No rash.  Neurological:     Mental Status: She is alert and oriented to person, place, and time.  Psychiatric:        Behavior: Behavior normal.  The plan was reviewed with the patient/family, and all questions/concerned were addressed.  It was my pleasure to see Fayola today and participate in her care. Please feel free to contact me with any questions or concerns.  Sincerely,  Rexene Alberts, DO Allergy & Immunology  [facility] office: [place] office: [phone]

## 2022-01-26 ENCOUNTER — Ambulatory Visit: Payer: Self-pay | Admitting: Allergy

## 2022-01-29 ENCOUNTER — Ambulatory Visit: Payer: Self-pay | Admitting: Allergy & Immunology

## 2022-02-01 NOTE — Progress Notes (Deleted)
New Patient Note  RE: Sharon Owen MRN: [id number] DOB: April 24, 1966 Date of Office Visit: 02/02/2022  Consult requested by: Alba Cory, MD Primary care provider: Alba Cory, MD  Chief Complaint: No chief complaint on file.  History of Present Illness: I had the pleasure of seeing Sharon Owen for initial evaluation at the [facility] on 02/01/2022. She is a 56 y.o. female, who is referred here by Alba Cory, MD for the evaluation of ***.  ***  Assessment and Plan: Sharon Owen is a 56 y.o. female with: No problem-specific Assessment & Plan notes found for this encounter.  No follow-ups on file.  No orders of the defined types were placed in this encounter.  Lab Orders  No laboratory test(s) ordered today    Other allergy screening: Asthma: {Blank single:19197::"yes","no"} Rhino conjunctivitis: {Blank single:19197::"yes","no"} Food allergy: {Blank single:19197::"yes","no"} Medication allergy: {Blank single:19197::"yes","no"} Hymenoptera allergy: {Blank single:19197::"yes","no"} Urticaria: {Blank single:19197::"yes","no"} Eczema:{Blank single:19197::"yes","no"} History of recurrent infections suggestive of immunodeficency: {Blank single:19197::"yes","no"}  Diagnostics: Spirometry:  Tracings reviewed. Her effort: {Blank single:19197::"Good reproducible efforts.","It was hard to get consistent efforts and there is a question as to whether this reflects a maximal maneuver.","Poor effort, data can not be interpreted."} FVC: ***L FEV1: ***L, ***% predicted FEV1/FVC ratio: ***% Interpretation: {Blank single:19197::"Spirometry consistent with mild obstructive disease","Spirometry consistent with moderate obstructive disease","Spirometry consistent with severe obstructive disease","Spirometry consistent with possible restrictive disease","Spirometry consistent with mixed obstructive and restrictive disease","Spirometry uninterpretable due to  technique","Spirometry consistent with normal pattern","No overt abnormalities noted given today's efforts"}.  Please see scanned spirometry results for details.  Skin Testing: {Blank single:19197::"Select foods","Environmental allergy panel","Environmental allergy panel and select foods","Food allergy panel","None","Deferred due to recent antihistamines use"}. *** Results discussed with patient/family.   Past Medical History: Patient Active Problem List   Diagnosis Date Noted  . Vitamin D deficiency 01/10/2018  . S/P laparoscopic sleeve gastrectomy with hiatal hernia repair Nov 2018 02/28/2017  . Hyperlipidemia 06/25/2016  . Metabolic syndrome 11/19/2015  . Lupus anticoagulant positive 11/19/2015  . Elevated C-reactive protein 07/08/2015  . Perimenopause 05/28/2015  . Morbid obesity (HCC) 03/11/2015  . Gastro-esophageal reflux disease without esophagitis 03/11/2015  . Anxiety 03/11/2015  . Insomnia, persistent 03/11/2015  . Osteoarthritis of both knees 03/11/2015  . Episcleritis 03/11/2015  . History of fracture of tibia 05/23/2014  . History of sprain of ankle 05/23/2014  . S/P right knee arthroscopy 12/28/2011  . Mononeuritis leg 07/17/2011  . Night muscle spasms 07/17/2011   Past Medical History:  Diagnosis Date  . Anxiety   . Chronic osteoarthritis    "knees" (06/24/2016)  . Complication of anesthesia    "I'm hard to wake up" (06/24/2016)  . Elevated blood pressure   . Episclerotitis, right    "of eye"  . GERD (gastroesophageal reflux disease)   . Headache    "monthly at least" (06/24/2016)  . Hypertension   . Insomnia   . Metabolic syndrome   . Morbid obesity (HCC)    Past Surgical History: Past Surgical History:  Procedure Laterality Date  . CESAREAN SECTION  1988  . CHONDROPLASTY  12/24/2011   Procedure: CHONDROPLASTY;  Surgeon: Vickki Hearing, MD;  Location: [facility];  Service: Orthopedics;  Laterality: Right;  . DILATION AND CURETTAGE OF UTERUS     "more  than 1"  . FRACTURE SURGERY    . INTRAUTERINE DEVICE INSERTION  ~ 2016  . LAPAROSCOPIC GASTRIC SLEEVE RESECTION N/A 02/28/2017   Procedure: LAPAROSCOPIC GASTRIC SLEEVE RESECTION, UPPER ENDOSCOPY WITH HIATAL HERNIA  REPAIR;  Surgeon: Luretha Murphy, MD;  Location: [facility];  Service: General;  Laterality: N/A;  . TIBIA IM NAIL INSERTION Right 05/23/2014   Procedure: INTRAMEDULLARY (IM) NAIL RIGHT TIBIA;  Surgeon: Harvie Junior, MD;  Location: [facility];  Service: Orthopedics;  Laterality: Right;  . VENTRAL HERNIA REPAIR  02/28/2017   Medication List:  Current Outpatient Medications  Medication Sig Dispense Refill  . ALPRAZolam (XANAX) 0.5 MG tablet TAKE HALF TO ONE TABLET BY MOUTH EVERY 4 HOURS AS NEEDED FOR ANXIETY 30 tablet 0  . azelastine (ASTELIN) 0.1 % nasal spray Place 1 spray into both nostrils 2 (two) times daily. Use in each nostril as directed 30 mL 2  . cetirizine (ZYRTEC) 10 MG chewable tablet Chew 1 tablet (10 mg total) by mouth daily. 20 tablet 0  . cyclobenzaprine (FLEXERIL) 10 MG tablet TAKE 1 TABLET BY MOUTH EVERYDAY AT BEDTIME 90 tablet 0  . diclofenac sodium (VOLTAREN) 1 % GEL Apply 2 g topically at bedtime as needed (for pain). 100 g 1  . hydrOXYzine (ATARAX/VISTARIL) 10 MG tablet Take 1 tablet (10 mg total) by mouth 3 (three) times daily as needed for anxiety. 90 tablet 0  . levocetirizine (XYZAL) 5 MG tablet Take 1 tablet (5 mg total) by mouth every evening. 30 tablet 1  . Multiple Vitamins-Minerals (MULTIVITAMIN PO) Take 1 tablet by mouth daily.    . pantoprazole (PROTONIX) 40 MG tablet TAKE 1 TABLET BY MOUTH EVERY DAY 30 tablet 0  . traZODone (DESYREL) 50 MG tablet TAKE 1 TABLET (50 MG TOTAL) BY MOUTH AT BEDTIME AS NEEDED FOR SLEEP. 90 tablet 0  . Vitamin D, Ergocalciferol, (DRISDOL) 1.25 MG (50000 UT) CAPS capsule Take 1 capsule (50,000 Units total) by mouth 2 (two) times a week. 24 capsule 1   No current facility-administered medications for this visit.    Allergies: Allergies  Allergen Reactions  . Adhesive [Tape] Itching  . Ambien [Zolpidem Tartrate]     Sleep walking   . Latex Itching  . Morphine And Related Rash   Social History: Social History   Socioeconomic History  . Marital status: Married    Spouse name: Gelene Mink   . Number of children: 3  . Years of education: Not on file  . Highest education level: Not on file  Occupational History  . Not on file  Tobacco Use  . Smoking status: Never  . Smokeless tobacco: Never  Vaping Use  . Vaping Use: Never used  Substance and Sexual Activity  . Alcohol use: Yes    Alcohol/week: 1.0 standard drink of alcohol    Types: 1 Shots of liquor per week  . Drug use: No  . Sexual activity: Yes    Birth control/protection: I.U.D.    Comment: Mirena  Other Topics Concern  . Not on file  Social History Narrative   Married   She is a travel Engineer, civil (consulting) now    Daughter joined the [employer] and is in [place]   Middle Daughter is graduating with clinical degree in research/pharmacy and is starting her new life   Oldest daughter is still at home - she had a baby - born 01/2019   Social Determinants of Health   Financial Resource Strain: Medium Risk (01/27/2018)   Overall Financial Resource Strain (CARDIA)   . Difficulty of Paying Living Expenses: Somewhat hard  Food Insecurity: No Food Insecurity (01/27/2018)   Hunger Vital Sign   . Worried About Programme researcher, broadcasting/film/video in the Last Year: Never  true   . Ran Out of Food in the Last Year: Never true  Transportation Needs: No Transportation Needs (01/27/2018)   PRAPARE - Transportation   . Lack of Transportation (Medical): No   . Lack of Transportation (Non-Medical): No  Physical Activity: Sufficiently Active (01/27/2018)   Exercise Vital Sign   . Days of Exercise per Week: 2 days   . Minutes of Exercise per Session: 90 min  Stress: Stress Concern Present (01/27/2018)   [place]    . Feeling of Stress : To some extent  Social Connections: Socially Integrated (01/27/2018)   Social Connection and Isolation Panel [NHANES]   . Frequency of Communication with Friends and Family: More than three times a week   . Frequency of Social Gatherings with Friends and Family: More than three times a week   . Attends Religious Services: More than 4 times per year   . Active Member of Clubs or Organizations: Yes   . Attends Archivist Meetings: More than 4 times per year   . Marital Status: Married   Lives in a ***. Smoking: *** Occupation: ***  Environmental HistoryFreight forwarder in the house: Estate agent in the family room: {Blank single:19197::"yes","no"} Carpet in the bedroom: {Blank single:19197::"yes","no"} Heating: {Blank single:19197::"electric","gas","heat pump"} Cooling: {Blank single:19197::"central","window","heat pump"} Pet: {Blank single:19197::"yes ***","no"}  Family History: Family History  Problem Relation Age of Onset  . Hypertension Mother   . Hypertension Father   . Diabetes Father   . Stroke Father   . Aortic dissection Father   . Insomnia Sister   . Hypertension Sister   . Diabetes Sister   . Heart failure Maternal Grandfather   . Diabetes Paternal Grandfather   . Heart attack Paternal Grandfather    Problem                               Relation Asthma                                   *** Eczema                                *** Food allergy                          *** Allergic rhino conjunctivitis     ***  Review of Systems  Constitutional:  Negative for appetite change, chills, fever and unexpected weight change.  HENT:  Negative for congestion and rhinorrhea.   Eyes:  Negative for itching.  Respiratory:  Negative for cough, chest tightness, shortness of breath and wheezing.   Cardiovascular:  Negative for chest pain.  Gastrointestinal:  Negative for abdominal pain.  Genitourinary:   Negative for difficulty urinating.  Skin:  Negative for rash.  Neurological:  Negative for headaches.   Objective: There were no vitals taken for this visit. There is no height or weight on file to calculate BMI. Physical Exam Vitals and nursing note reviewed.  Constitutional:      Appearance: Normal appearance. She is well-developed.  HENT:     Head: Normocephalic and atraumatic.     Right Ear: Tympanic membrane and external ear normal.     Left Ear: Tympanic membrane and external  ear normal.     Nose: Nose normal.     Mouth/Throat:     Mouth: Mucous membranes are moist.     Pharynx: Oropharynx is clear.  Eyes:     Conjunctiva/sclera: Conjunctivae normal.  Cardiovascular:     Rate and Rhythm: Normal rate and regular rhythm.     Heart sounds: Normal heart sounds. No murmur heard.    No friction rub. No gallop.  Pulmonary:     Effort: Pulmonary effort is normal.     Breath sounds: Normal breath sounds. No wheezing, rhonchi or rales.  Musculoskeletal:     Cervical back: Neck supple.  Skin:    General: Skin is warm.     Findings: No rash.  Neurological:     Mental Status: She is alert and oriented to person, place, and time.  Psychiatric:        Behavior: Behavior normal.  The plan was reviewed with the patient/family, and all questions/concerned were addressed.  It was my pleasure to see Sharon Owen today and participate in her care. Please feel free to contact me with any questions or concerns.  Sincerely,  Wyline Mood, DO Allergy & Immunology  [facility] office: [phone] [facility] office: [phone]

## 2022-02-02 ENCOUNTER — Ambulatory Visit: Payer: Self-pay | Admitting: Allergy

## 2022-02-19 ENCOUNTER — Ambulatory Visit: Payer: Self-pay | Admitting: Allergy & Immunology

## 2022-05-11 ENCOUNTER — Other Ambulatory Visit: Payer: Self-pay

## 2022-05-11 MED ORDER — HYDROXYZINE HCL 25 MG PO TABS
ORAL_TABLET | ORAL | 0 refills | Status: DC
  Filled 2022-05-11: qty 30, 10d supply, fill #0

## 2022-05-11 MED ORDER — PANTOPRAZOLE SODIUM 40 MG PO TBEC
40.0000 mg | DELAYED_RELEASE_TABLET | Freq: Every day | ORAL | 0 refills | Status: DC
  Filled 2022-05-11: qty 30, 30d supply, fill #0

## 2022-05-11 MED ORDER — LOSARTAN POTASSIUM 100 MG PO TABS
ORAL_TABLET | ORAL | 1 refills | Status: DC
  Filled 2022-05-11: qty 30, 30d supply, fill #0
  Filled 2022-11-30: qty 30, 30d supply, fill #1

## 2022-05-13 ENCOUNTER — Other Ambulatory Visit: Payer: Self-pay

## 2022-06-10 ENCOUNTER — Other Ambulatory Visit: Payer: Self-pay

## 2022-06-10 DIAGNOSIS — J4530 Mild persistent asthma, uncomplicated: Secondary | ICD-10-CM | POA: Diagnosis not present

## 2022-06-10 DIAGNOSIS — R5383 Other fatigue: Secondary | ICD-10-CM | POA: Diagnosis not present

## 2022-06-10 DIAGNOSIS — I10 Essential (primary) hypertension: Secondary | ICD-10-CM | POA: Diagnosis not present

## 2022-06-10 DIAGNOSIS — F419 Anxiety disorder, unspecified: Secondary | ICD-10-CM | POA: Diagnosis not present

## 2022-06-10 MED ORDER — TRAZODONE HCL 50 MG PO TABS
50.0000 mg | ORAL_TABLET | Freq: Every day | ORAL | 0 refills | Status: AC | PRN
  Filled 2022-06-10: qty 90, 90d supply, fill #0

## 2022-06-10 MED ORDER — LOSARTAN POTASSIUM 100 MG PO TABS
100.0000 mg | ORAL_TABLET | Freq: Every day | ORAL | 0 refills | Status: AC
  Filled 2022-06-10: qty 90, 90d supply, fill #0

## 2022-06-10 MED ORDER — HYDROXYZINE HCL 25 MG PO TABS
25.0000 mg | ORAL_TABLET | Freq: Three times a day (TID) | ORAL | 0 refills | Status: DC | PRN
  Filled 2022-06-10: qty 30, 10d supply, fill #0

## 2022-06-10 MED ORDER — MONTELUKAST SODIUM 10 MG PO TABS
10.0000 mg | ORAL_TABLET | Freq: Every day | ORAL | 0 refills | Status: AC
  Filled 2022-06-10: qty 90, 90d supply, fill #0

## 2022-06-10 MED ORDER — PANTOPRAZOLE SODIUM 40 MG PO TBEC
40.0000 mg | DELAYED_RELEASE_TABLET | Freq: Every day | ORAL | 0 refills | Status: DC
  Filled 2022-06-10: qty 90, 90d supply, fill #0

## 2022-06-22 ENCOUNTER — Other Ambulatory Visit: Payer: Self-pay

## 2022-06-22 MED ORDER — ALPRAZOLAM 0.5 MG PO TABS
0.5000 mg | ORAL_TABLET | Freq: Every day | ORAL | 0 refills | Status: DC | PRN
  Filled 2022-06-22 – 2022-07-01 (×2): qty 30, 30d supply, fill #0

## 2022-06-29 ENCOUNTER — Other Ambulatory Visit: Payer: Self-pay

## 2022-07-01 ENCOUNTER — Other Ambulatory Visit: Payer: Self-pay

## 2022-07-07 DIAGNOSIS — M25572 Pain in left ankle and joints of left foot: Secondary | ICD-10-CM | POA: Diagnosis not present

## 2022-07-08 ENCOUNTER — Other Ambulatory Visit: Payer: Self-pay

## 2022-07-08 MED ORDER — PREDNISONE 5 MG PO TABS
ORAL_TABLET | ORAL | 0 refills | Status: AC
  Filled 2022-07-08: qty 21, 6d supply, fill #0

## 2022-07-09 ENCOUNTER — Other Ambulatory Visit: Payer: Self-pay

## 2022-07-16 ENCOUNTER — Other Ambulatory Visit: Payer: Self-pay

## 2022-07-16 DIAGNOSIS — M76812 Anterior tibial syndrome, left leg: Secondary | ICD-10-CM | POA: Diagnosis not present

## 2022-07-16 MED ORDER — PREDNISONE 10 MG PO TABS
ORAL_TABLET | ORAL | 0 refills | Status: AC
  Filled 2022-07-16: qty 14, 6d supply, fill #0

## 2022-07-20 ENCOUNTER — Other Ambulatory Visit: Payer: Self-pay

## 2022-08-04 ENCOUNTER — Other Ambulatory Visit: Payer: Self-pay

## 2022-08-04 DIAGNOSIS — M25572 Pain in left ankle and joints of left foot: Secondary | ICD-10-CM | POA: Diagnosis not present

## 2022-08-04 MED ORDER — PANTOPRAZOLE SODIUM 40 MG PO TBEC
40.0000 mg | DELAYED_RELEASE_TABLET | Freq: Every day | ORAL | 0 refills | Status: DC
  Filled 2022-08-04 – 2022-08-06 (×2): qty 90, 90d supply, fill #0

## 2022-08-05 ENCOUNTER — Other Ambulatory Visit: Payer: Self-pay | Admitting: Orthopedic Surgery

## 2022-08-05 DIAGNOSIS — M25572 Pain in left ankle and joints of left foot: Secondary | ICD-10-CM

## 2022-08-06 ENCOUNTER — Other Ambulatory Visit: Payer: Self-pay

## 2022-08-17 DIAGNOSIS — Z117 Encounter for testing for latent tuberculosis infection: Secondary | ICD-10-CM | POA: Diagnosis not present

## 2022-08-18 DIAGNOSIS — M76812 Anterior tibial syndrome, left leg: Secondary | ICD-10-CM | POA: Diagnosis not present

## 2022-08-18 DIAGNOSIS — M25572 Pain in left ankle and joints of left foot: Secondary | ICD-10-CM | POA: Diagnosis not present

## 2022-09-02 ENCOUNTER — Ambulatory Visit
Admission: RE | Admit: 2022-09-02 | Discharge: 2022-09-02 | Disposition: A | Discharge status: Home / Self Care | Payer: 59 | Source: Ambulatory Visit | Attending: Orthopedic Surgery | Admitting: Orthopedic Surgery

## 2022-09-02 DIAGNOSIS — M25572 Pain in left ankle and joints of left foot: Secondary | ICD-10-CM

## 2022-09-09 ENCOUNTER — Other Ambulatory Visit: Payer: Self-pay

## 2022-09-09 MED ORDER — VITAMIN D (ERGOCALCIFEROL) 1.25 MG (50000 UNIT) PO CAPS
50000.0000 [IU] | ORAL_CAPSULE | ORAL | 0 refills | Status: AC
  Filled 2022-09-09: qty 12, 84d supply, fill #0

## 2022-09-10 ENCOUNTER — Other Ambulatory Visit: Payer: Self-pay

## 2022-09-13 ENCOUNTER — Other Ambulatory Visit: Payer: Self-pay

## 2022-09-13 MED ORDER — TRAZODONE HCL 50 MG PO TABS
50.0000 mg | ORAL_TABLET | Freq: Every day | ORAL | 0 refills | Status: AC | PRN
  Filled 2022-09-13: qty 90, 90d supply, fill #0

## 2022-09-13 MED ORDER — MONTELUKAST SODIUM 10 MG PO TABS
10.0000 mg | ORAL_TABLET | Freq: Every day | ORAL | 0 refills | Status: DC
  Filled 2022-09-13: qty 90, 90d supply, fill #0

## 2022-09-14 ENCOUNTER — Other Ambulatory Visit: Payer: Self-pay

## 2022-09-14 MED ORDER — ALPRAZOLAM 0.5 MG PO TABS
0.5000 mg | ORAL_TABLET | Freq: Every day | ORAL | 0 refills | Status: AC | PRN
  Filled 2022-09-14: qty 30, 30d supply, fill #0

## 2022-09-14 MED ORDER — PHENTERMINE HCL 37.5 MG PO TABS
37.5000 mg | ORAL_TABLET | Freq: Every day | ORAL | 0 refills | Status: AC
  Filled 2022-09-14: qty 90, 90d supply, fill #0

## 2022-09-15 ENCOUNTER — Other Ambulatory Visit: Payer: Self-pay

## 2022-09-16 ENCOUNTER — Other Ambulatory Visit: Payer: Self-pay

## 2022-10-05 ENCOUNTER — Encounter (HOSPITAL_COMMUNITY): Payer: Self-pay | Admitting: *Deleted

## 2022-11-01 ENCOUNTER — Other Ambulatory Visit: Payer: Self-pay

## 2022-11-02 ENCOUNTER — Other Ambulatory Visit: Payer: Self-pay

## 2022-11-02 MED ORDER — PANTOPRAZOLE SODIUM 40 MG PO TBEC
40.0000 mg | DELAYED_RELEASE_TABLET | Freq: Every day | ORAL | 0 refills | Status: DC
  Filled 2022-11-02: qty 90, 90d supply, fill #0

## 2022-11-16 DIAGNOSIS — R635 Abnormal weight gain: Secondary | ICD-10-CM | POA: Diagnosis not present

## 2022-11-16 DIAGNOSIS — Z1321 Encounter for screening for nutritional disorder: Secondary | ICD-10-CM | POA: Diagnosis not present

## 2022-11-16 DIAGNOSIS — R5383 Other fatigue: Secondary | ICD-10-CM | POA: Diagnosis not present

## 2022-11-16 DIAGNOSIS — E569 Vitamin deficiency, unspecified: Secondary | ICD-10-CM | POA: Diagnosis not present

## 2022-11-16 DIAGNOSIS — Z903 Acquired absence of stomach [part of]: Secondary | ICD-10-CM | POA: Diagnosis not present

## 2022-11-30 ENCOUNTER — Other Ambulatory Visit: Payer: Self-pay

## 2022-12-20 ENCOUNTER — Other Ambulatory Visit: Payer: Self-pay

## 2022-12-20 DIAGNOSIS — M7732 Calcaneal spur, left foot: Secondary | ICD-10-CM | POA: Diagnosis not present

## 2022-12-20 DIAGNOSIS — Z0000 Encounter for general adult medical examination without abnormal findings: Secondary | ICD-10-CM | POA: Diagnosis not present

## 2022-12-20 DIAGNOSIS — R5383 Other fatigue: Secondary | ICD-10-CM | POA: Diagnosis not present

## 2022-12-20 DIAGNOSIS — J4530 Mild persistent asthma, uncomplicated: Secondary | ICD-10-CM | POA: Diagnosis not present

## 2022-12-20 DIAGNOSIS — I10 Essential (primary) hypertension: Secondary | ICD-10-CM | POA: Diagnosis not present

## 2022-12-20 MED ORDER — LOSARTAN POTASSIUM 50 MG PO TABS
50.0000 mg | ORAL_TABLET | Freq: Every day | ORAL | 0 refills | Status: DC
  Filled 2022-12-20: qty 90, 90d supply, fill #0

## 2022-12-20 MED ORDER — PHENTERMINE HCL 37.5 MG PO CAPS
37.5000 mg | ORAL_CAPSULE | Freq: Every day | ORAL | 0 refills | Status: AC
  Filled 2022-12-20: qty 30, 30d supply, fill #0
  Filled 2023-03-18: qty 30, 30d supply, fill #1

## 2022-12-20 MED ORDER — LOSARTAN POTASSIUM 100 MG PO TABS
100.0000 mg | ORAL_TABLET | Freq: Every day | ORAL | 0 refills | Status: AC
  Filled 2022-12-20 – 2022-12-24 (×2): qty 90, 90d supply, fill #0

## 2022-12-20 MED ORDER — TRAZODONE HCL 50 MG PO TABS
50.0000 mg | ORAL_TABLET | Freq: Every day | ORAL | 0 refills | Status: DC | PRN
  Filled 2022-12-20: qty 90, 90d supply, fill #0

## 2022-12-21 ENCOUNTER — Other Ambulatory Visit: Payer: Self-pay

## 2022-12-24 ENCOUNTER — Other Ambulatory Visit: Payer: Self-pay

## 2022-12-30 ENCOUNTER — Other Ambulatory Visit: Payer: Self-pay

## 2023-01-03 ENCOUNTER — Other Ambulatory Visit: Payer: Self-pay

## 2023-01-04 ENCOUNTER — Other Ambulatory Visit: Payer: Self-pay

## 2023-01-04 MED ORDER — PANTOPRAZOLE SODIUM 40 MG PO TBEC
40.0000 mg | DELAYED_RELEASE_TABLET | Freq: Every day | ORAL | 0 refills | Status: DC
  Filled 2023-01-04 – 2023-01-13 (×3): qty 90, 90d supply, fill #0

## 2023-01-05 ENCOUNTER — Other Ambulatory Visit: Payer: Self-pay

## 2023-01-13 ENCOUNTER — Other Ambulatory Visit: Payer: Self-pay

## 2023-01-24 ENCOUNTER — Other Ambulatory Visit: Payer: Self-pay

## 2023-03-07 ENCOUNTER — Other Ambulatory Visit: Payer: Self-pay

## 2023-03-07 MED ORDER — PANTOPRAZOLE SODIUM 40 MG PO TBEC
40.0000 mg | DELAYED_RELEASE_TABLET | Freq: Every day | ORAL | 0 refills | Status: DC
  Filled 2023-03-07: qty 30, 30d supply, fill #0
  Filled 2023-03-18 – 2023-04-04 (×2): qty 90, 90d supply, fill #0

## 2023-03-16 ENCOUNTER — Other Ambulatory Visit: Payer: Self-pay

## 2023-03-18 ENCOUNTER — Other Ambulatory Visit: Payer: Self-pay

## 2023-03-18 ENCOUNTER — Other Ambulatory Visit: Payer: Self-pay | Admitting: Family Medicine

## 2023-03-18 DIAGNOSIS — M62838 Other muscle spasm: Secondary | ICD-10-CM

## 2023-03-18 MED ORDER — ALPRAZOLAM 0.5 MG PO TABS
0.5000 mg | ORAL_TABLET | Freq: Every day | ORAL | 0 refills | Status: AC
  Filled 2023-03-18: qty 30, 30d supply, fill #0

## 2023-03-18 MED ORDER — TRAZODONE HCL 50 MG PO TABS
50.0000 mg | ORAL_TABLET | Freq: Every day | ORAL | 0 refills | Status: AC | PRN
  Filled 2023-03-18: qty 90, 90d supply, fill #0

## 2023-03-18 MED ORDER — HYDROXYZINE HCL 25 MG PO TABS
25.0000 mg | ORAL_TABLET | Freq: Three times a day (TID) | ORAL | 0 refills | Status: AC | PRN
  Filled 2023-03-18: qty 30, 10d supply, fill #0

## 2023-03-18 MED ORDER — LOSARTAN POTASSIUM 50 MG PO TABS
50.0000 mg | ORAL_TABLET | Freq: Every day | ORAL | 0 refills | Status: AC
  Filled 2023-03-18: qty 90, 90d supply, fill #0

## 2023-03-21 ENCOUNTER — Other Ambulatory Visit: Payer: Self-pay

## 2023-03-22 ENCOUNTER — Other Ambulatory Visit: Payer: Self-pay

## 2023-03-29 ENCOUNTER — Other Ambulatory Visit: Payer: Self-pay

## 2023-03-29 DIAGNOSIS — M1711 Unilateral primary osteoarthritis, right knee: Secondary | ICD-10-CM | POA: Diagnosis not present

## 2023-03-29 DIAGNOSIS — M170 Bilateral primary osteoarthritis of knee: Secondary | ICD-10-CM | POA: Diagnosis not present

## 2023-03-29 DIAGNOSIS — M25552 Pain in left hip: Secondary | ICD-10-CM | POA: Diagnosis not present

## 2023-03-29 DIAGNOSIS — M25572 Pain in left ankle and joints of left foot: Secondary | ICD-10-CM | POA: Diagnosis not present

## 2023-03-29 DIAGNOSIS — M1712 Unilateral primary osteoarthritis, left knee: Secondary | ICD-10-CM | POA: Diagnosis not present

## 2023-03-29 MED ORDER — MELOXICAM 7.5 MG PO TABS
7.5000 mg | ORAL_TABLET | Freq: Every day | ORAL | 1 refills | Status: AC
  Filled 2023-03-29 – 2023-05-17 (×2): qty 30, 30d supply, fill #0
  Filled 2023-06-27: qty 30, 30d supply, fill #1

## 2023-04-04 ENCOUNTER — Other Ambulatory Visit: Payer: Self-pay

## 2023-04-05 ENCOUNTER — Other Ambulatory Visit: Payer: Self-pay | Admitting: Nurse Practitioner

## 2023-04-05 ENCOUNTER — Other Ambulatory Visit: Payer: Self-pay

## 2023-04-05 ENCOUNTER — Other Ambulatory Visit (HOSPITAL_COMMUNITY)
Admission: RE | Admit: 2023-04-05 | Discharge: 2023-04-05 | Disposition: A | Discharge status: Home / Self Care | Payer: 59 | Source: Ambulatory Visit | Attending: Nurse Practitioner | Admitting: Nurse Practitioner

## 2023-04-05 DIAGNOSIS — Z01419 Encounter for gynecological examination (general) (routine) without abnormal findings: Secondary | ICD-10-CM | POA: Diagnosis not present

## 2023-04-05 DIAGNOSIS — Z30432 Encounter for removal of intrauterine contraceptive device: Secondary | ICD-10-CM | POA: Diagnosis not present

## 2023-04-05 DIAGNOSIS — Z124 Encounter for screening for malignant neoplasm of cervix: Secondary | ICD-10-CM | POA: Insufficient documentation

## 2023-04-05 DIAGNOSIS — N951 Menopausal and female climacteric states: Secondary | ICD-10-CM | POA: Diagnosis not present

## 2023-04-05 DIAGNOSIS — I10 Essential (primary) hypertension: Secondary | ICD-10-CM | POA: Diagnosis not present

## 2023-04-05 DIAGNOSIS — R232 Flushing: Secondary | ICD-10-CM | POA: Diagnosis not present

## 2023-04-05 MED ORDER — VEOZAH 45 MG PO TABS
45.0000 mg | ORAL_TABLET | Freq: Every day | ORAL | 0 refills | Status: DC
  Filled 2023-04-05: qty 30, 30d supply, fill #0

## 2023-04-07 LAB — CYTOLOGY - PAP
Comment: NEGATIVE
Diagnosis: NEGATIVE
High risk HPV: NEGATIVE

## 2023-05-09 ENCOUNTER — Other Ambulatory Visit: Payer: Self-pay

## 2023-05-09 DIAGNOSIS — N951 Menopausal and female climacteric states: Secondary | ICD-10-CM | POA: Diagnosis not present

## 2023-05-09 MED ORDER — VEOZAH 45 MG PO TABS
45.0000 mg | ORAL_TABLET | Freq: Every day | ORAL | 0 refills | Status: DC
  Filled 2023-05-09: qty 30, 30d supply, fill #0

## 2023-05-10 ENCOUNTER — Other Ambulatory Visit: Payer: Self-pay

## 2023-05-11 ENCOUNTER — Other Ambulatory Visit: Payer: Self-pay

## 2023-05-13 ENCOUNTER — Other Ambulatory Visit: Payer: Self-pay

## 2023-05-17 ENCOUNTER — Other Ambulatory Visit: Payer: Self-pay

## 2023-06-10 ENCOUNTER — Other Ambulatory Visit: Payer: Self-pay

## 2023-06-10 ENCOUNTER — Other Ambulatory Visit (HOSPITAL_COMMUNITY): Payer: Self-pay

## 2023-06-10 ENCOUNTER — Telehealth: Payer: 59 | Admitting: Family Medicine

## 2023-06-10 DIAGNOSIS — J069 Acute upper respiratory infection, unspecified: Secondary | ICD-10-CM | POA: Diagnosis not present

## 2023-06-10 MED ORDER — PROMETHAZINE-DM 6.25-15 MG/5ML PO SYRP
5.0000 mL | ORAL_SOLUTION | Freq: Four times a day (QID) | ORAL | 0 refills | Status: AC | PRN
  Filled 2023-06-10 (×2): qty 118, 6d supply, fill #0

## 2023-06-10 MED ORDER — AMOXICILLIN 875 MG PO TABS
875.0000 mg | ORAL_TABLET | Freq: Two times a day (BID) | ORAL | 0 refills | Status: AC
  Filled 2023-06-10 (×2): qty 20, 10d supply, fill #0

## 2023-06-10 MED ORDER — PREDNISONE 20 MG PO TABS
20.0000 mg | ORAL_TABLET | Freq: Two times a day (BID) | ORAL | 0 refills | Status: AC
  Filled 2023-06-10 (×4): qty 10, 5d supply, fill #0

## 2023-06-10 NOTE — Progress Notes (Signed)
Virtual Visit Consent   Sharon Owen, you are scheduled for a virtual visit with a  provider today. Just as with appointments in the office, your consent must be obtained to participate. Your consent will be active for this visit and any virtual visit you may have with one of our providers in the next 365 days. If you have a MyChart account, a copy of this consent can be sent to you electronically.  As this is a virtual visit, video technology does not allow for your provider to perform a traditional examination. This may limit your provider's ability to fully assess your condition. If your provider identifies any concerns that need to be evaluated in person or the need to arrange testing (such as labs, EKG, etc.), we will make arrangements to do so. Although advances in technology are sophisticated, we cannot ensure that it will always work on either your end or our end. If the connection with a video visit is poor, the visit may have to be switched to a telephone visit. With either a video or telephone visit, we are not always able to ensure that we have a secure connection.  By engaging in this virtual visit, you consent to the provision of healthcare and authorize for your insurance to be billed (if applicable) for the services provided during this visit. Depending on your insurance coverage, you may receive a charge related to this service.  I need to obtain your verbal consent now. Are you willing to proceed with your visit today? Sharon KERNES has provided verbal consent on 06/10/2023 for a virtual visit (video or telephone). Sharon Curio, FNP  Date: 06/10/2023 11:01 AM   Virtual Visit via Video Note   I, Sharon Owen, connected with  Sharon Owen  ([id number], 09-25-1965) on 06/10/23 at 11:00 AM EST by a video-enabled telemedicine application and verified that I am speaking with the correct person using two identifiers.  Location: Patient: Virtual Visit Location Patient:  Home Provider: Virtual Visit Location Provider: Home Office   I discussed the limitations of evaluation and management by telemedicine and the availability of in person appointments. The patient expressed understanding and agreed to proceed.    History of Present Illness: Sharon Owen is a 58 y.o. who identifies as a female who was assigned female at birth, and is being seen today for cough, yellow mucus, ear pain, sore throat, fever for 2-3 days persistent. [name]  HPI: HPI  Problems:  Patient Active Problem List   Diagnosis Date Noted   Vitamin D deficiency 01/10/2018   S/P laparoscopic sleeve gastrectomy with hiatal hernia repair Nov 2018 02/28/2017   Hyperlipidemia 06/25/2016   Metabolic syndrome 11/19/2015   Lupus anticoagulant positive 11/19/2015   Elevated C-reactive protein 07/08/2015   Perimenopause 05/28/2015   Morbid obesity (HCC) 03/11/2015   Gastro-esophageal reflux disease without esophagitis 03/11/2015   Anxiety 03/11/2015   Insomnia, persistent 03/11/2015   Osteoarthritis of both knees 03/11/2015   Episcleritis 03/11/2015   History of fracture of tibia 05/23/2014   History of sprain of ankle 05/23/2014   S/P right knee arthroscopy 12/28/2011   Mononeuritis leg 07/17/2011   Night muscle spasms 07/17/2011    Allergies:  Allergies  Allergen Reactions   Adhesive [Tape] Itching   Ambien [Zolpidem Tartrate]     Sleep walking    Latex Itching   Morphine And Codeine Rash   Medications:  Current Outpatient Medications:    ALPRAZolam (XANAX) 0.5 MG tablet, TAKE HALF TO  ONE TABLET BY MOUTH EVERY 4 HOURS AS NEEDED FOR ANXIETY, Disp: 30 tablet, Rfl: 0   ALPRAZolam (XANAX) 0.5 MG tablet, Take 1 tablet (0.5 mg total) by mouth daily as needed., Disp: 30 tablet, Rfl: 0   ALPRAZolam (XANAX) 0.5 MG tablet, Take 1 tablet (0.5 mg total) by mouth daily., Disp: 30 tablet, Rfl: 0   azelastine (ASTELIN) 0.1 % nasal spray, Place 1 spray into both nostrils 2 (two) times daily. Use  in each nostril as directed, Disp: 30 mL, Rfl: 2   cetirizine (ZYRTEC) 10 MG chewable tablet, Chew 1 tablet (10 mg total) by mouth daily., Disp: 20 tablet, Rfl: 0   cyclobenzaprine (FLEXERIL) 10 MG tablet, TAKE 1 TABLET BY MOUTH EVERYDAY AT BEDTIME, Disp: 90 tablet, Rfl: 0   diclofenac sodium (VOLTAREN) 1 % GEL, Apply 2 g topically at bedtime as needed (for pain)., Disp: 100 g, Rfl: 1   Fezolinetant (VEOZAH) 45 MG TABS, Take 1 tablet (45 mg total) by mouth daily., Disp: 30 tablet, Rfl: 0   hydrOXYzine (ATARAX) 25 MG tablet, Take 1 tablet (25 mg total) by mouth 3 (three) times daily as needed for itching., Disp: 30 tablet, Rfl: 0   hydrOXYzine (ATARAX/VISTARIL) 10 MG tablet, Take 1 tablet (10 mg total) by mouth 3 (three) times daily as needed for anxiety., Disp: 90 tablet, Rfl: 0   levocetirizine (XYZAL) 5 MG tablet, Take 1 tablet (5 mg total) by mouth every evening., Disp: 30 tablet, Rfl: 1   losartan (COZAAR) 100 MG tablet, Take 1 tablet (100 mg total) by mouth daily., Disp: 90 tablet, Rfl: 0   losartan (COZAAR) 100 MG tablet, Take 1 tablet (100 mg total) by mouth daily., Disp: 90 tablet, Rfl: 0   losartan (COZAAR) 50 MG tablet, take 1 tablet (50 mg total) by mouth daily., Disp: 90 tablet, Rfl: 0   meloxicam (MOBIC) 7.5 MG tablet, Take 1 tablet (7.5 mg total) by mouth daily with food., Disp: 30 tablet, Rfl: 1   montelukast (SINGULAIR) 10 MG tablet, Take 1 tablet (10 mg total) by mouth daily., Disp: 90 tablet, Rfl: 0   montelukast (SINGULAIR) 10 MG tablet, Take 1 tablet (10 mg total) by mouth daily., Disp: 90 tablet, Rfl: 0   Multiple Vitamins-Minerals (MULTIVITAMIN PO), Take 1 tablet by mouth daily., Disp: , Rfl:    pantoprazole (PROTONIX) 40 MG tablet, TAKE 1 TABLET BY MOUTH EVERY DAY, Disp: 30 tablet, Rfl: 0   pantoprazole (PROTONIX) 40 MG tablet, take 1 tablet (40 mg total) by mouth daily., Disp: 90 tablet, Rfl: 0   phentermine (ADIPEX-P) 37.5 MG tablet, Take 1 tablet (37.5 mg total) by mouth  daily. Must administer 30 minutes before or 1-2 hours after breakfast, Disp: 90 tablet, Rfl: 0   phentermine 37.5 MG capsule, Take 1 capsule (37.5 mg total) by mouth daily (30 MINS before breakfast OR 1-2HRS after breakfast), Disp: 90 capsule, Rfl: 0   traZODone (DESYREL) 50 MG tablet, TAKE 1 TABLET (50 MG TOTAL) BY MOUTH AT BEDTIME AS NEEDED FOR SLEEP., Disp: 90 tablet, Rfl: 0   traZODone (DESYREL) 50 MG tablet, Take 1 tablet (50 mg total) by mouth daily as needed., Disp: 90 tablet, Rfl: 0   traZODone (DESYREL) 50 MG tablet, Take 1 tablet (50 mg total) by mouth daily as needed., Disp: 90 tablet, Rfl: 0   traZODone (DESYREL) 50 MG tablet, Take 1 tablet (50 mg total) by mouth daily as needed., Disp: 90 tablet, Rfl: 0   Vitamin D, Ergocalciferol, (DRISDOL) 1.25  MG (50000 UNIT) CAPS capsule, Take 1 capsule (50,000 Units total) by mouth once a week., Disp: 12 capsule, Rfl: 0   Vitamin D, Ergocalciferol, (DRISDOL) 1.25 MG (50000 UT) CAPS capsule, Take 1 capsule (50,000 Units total) by mouth 2 (two) times a week., Disp: 24 capsule, Rfl: 1  Observations/Objective: Patient is well-developed, well-nourished in no acute distress.  Resting comfortably  at home.  Head is normocephalic, atraumatic.  No labored breathing.  Speech is clear and coherent with logical content.  Patient is alert and oriented at baseline.   Assessment and Plan: 1. Upper respiratory tract infection, unspecified type (Primary)  Increase fluids, warm salt water gargles, tylenol, hold antibiotic for 2-3 days and start if sx persist or worsen.   Follow Up Instructions: I discussed the assessment and treatment plan with the patient. The patient was provided an opportunity to ask questions and all were answered. The patient agreed with the plan and demonstrated an understanding of the instructions.  A copy of instructions were sent to the patient via MyChart unless otherwise noted below.     The patient was advised to call back or  seek an in-person evaluation if the symptoms worsen or if the condition fails to improve as anticipated.    Sharon Curio, FNP

## 2023-06-10 NOTE — Patient Instructions (Signed)

## 2023-06-22 ENCOUNTER — Other Ambulatory Visit (HOSPITAL_COMMUNITY): Payer: Self-pay

## 2023-06-22 MED ORDER — AMOXICILLIN 500 MG PO TABS
500.0000 mg | ORAL_TABLET | Freq: Three times a day (TID) | ORAL | 1 refills | Status: DC
  Filled 2023-06-22 – 2023-06-23 (×2): qty 21, 7d supply, fill #0

## 2023-06-23 ENCOUNTER — Other Ambulatory Visit (HOSPITAL_COMMUNITY): Payer: Self-pay

## 2023-06-23 ENCOUNTER — Other Ambulatory Visit: Payer: Self-pay

## 2023-06-23 MED ORDER — AMOXICILLIN 500 MG PO CAPS
500.0000 mg | ORAL_CAPSULE | Freq: Three times a day (TID) | ORAL | 1 refills | Status: AC
  Filled 2023-06-23: qty 21, 7d supply, fill #0

## 2023-06-24 ENCOUNTER — Other Ambulatory Visit: Payer: Self-pay

## 2023-06-27 ENCOUNTER — Other Ambulatory Visit: Payer: Self-pay

## 2023-06-27 DIAGNOSIS — I10 Essential (primary) hypertension: Secondary | ICD-10-CM | POA: Diagnosis not present

## 2023-06-27 DIAGNOSIS — M7732 Calcaneal spur, left foot: Secondary | ICD-10-CM | POA: Diagnosis not present

## 2023-06-27 DIAGNOSIS — R5383 Other fatigue: Secondary | ICD-10-CM | POA: Diagnosis not present

## 2023-06-27 DIAGNOSIS — J4530 Mild persistent asthma, uncomplicated: Secondary | ICD-10-CM | POA: Diagnosis not present

## 2023-06-27 MED ORDER — MOVE FREE JOINT HEALTH ADVANCE PO TABS
1.0000 | ORAL_TABLET | Freq: Every day | ORAL | 0 refills | Status: AC
  Filled 2023-09-21: qty 90, fill #0

## 2023-06-27 MED ORDER — LOSARTAN POTASSIUM 50 MG PO TABS
50.0000 mg | ORAL_TABLET | Freq: Every day | ORAL | 0 refills | Status: AC
  Filled 2023-06-27: qty 90, 90d supply, fill #0

## 2023-06-27 MED ORDER — TRAZODONE HCL 50 MG PO TABS
50.0000 mg | ORAL_TABLET | Freq: Every day | ORAL | 0 refills | Status: DC | PRN
  Filled 2023-06-27: qty 90, 90d supply, fill #0
  Filled 2023-09-21: qty 10, 10d supply, fill #1

## 2023-06-27 MED ORDER — PANTOPRAZOLE SODIUM 40 MG PO TBEC
40.0000 mg | DELAYED_RELEASE_TABLET | Freq: Every day | ORAL | 0 refills | Status: AC
  Filled 2023-06-27: qty 90, 90d supply, fill #0
  Filled 2023-06-30: qty 100, 100d supply, fill #0
  Filled 2023-07-01 – 2024-01-10 (×3): qty 90, 90d supply, fill #0

## 2023-06-29 ENCOUNTER — Other Ambulatory Visit: Payer: Self-pay

## 2023-06-29 MED ORDER — PANTOPRAZOLE SODIUM 40 MG PO TBEC
40.0000 mg | DELAYED_RELEASE_TABLET | Freq: Two times a day (BID) | ORAL | 0 refills | Status: DC
  Filled 2023-06-29: qty 200, 100d supply, fill #0
  Filled 2023-07-01: qty 180, 90d supply, fill #0

## 2023-06-30 ENCOUNTER — Other Ambulatory Visit: Payer: Self-pay

## 2023-06-30 MED ORDER — CYANOCOBALAMIN 1000 MCG PO TABS
2000.0000 ug | ORAL_TABLET | Freq: Every day | ORAL | 0 refills | Status: AC
  Filled 2023-06-30: qty 200, 100d supply, fill #0

## 2023-06-30 MED ORDER — FOLIC ACID 1 MG PO TABS
1.0000 mg | ORAL_TABLET | Freq: Every day | ORAL | 1 refills | Status: AC
  Filled 2023-06-30: qty 90, 90d supply, fill #0

## 2023-06-30 MED ORDER — VITAMIN D (ERGOCALCIFEROL) 1.25 MG (50000 UNIT) PO CAPS
50000.0000 [IU] | ORAL_CAPSULE | ORAL | 0 refills | Status: AC
  Filled 2023-06-30: qty 12, 84d supply, fill #0

## 2023-07-01 ENCOUNTER — Other Ambulatory Visit: Payer: Self-pay

## 2023-08-30 ENCOUNTER — Other Ambulatory Visit: Payer: Self-pay

## 2023-09-02 ENCOUNTER — Other Ambulatory Visit: Payer: Self-pay

## 2023-09-02 MED ORDER — ALPRAZOLAM 0.5 MG PO TABS
0.5000 mg | ORAL_TABLET | Freq: Every day | ORAL | 0 refills | Status: DC | PRN
  Filled 2023-09-02 (×2): qty 30, 30d supply, fill #0

## 2023-09-05 ENCOUNTER — Other Ambulatory Visit: Payer: Self-pay

## 2023-09-21 ENCOUNTER — Other Ambulatory Visit: Payer: Self-pay

## 2023-09-22 ENCOUNTER — Other Ambulatory Visit: Payer: Self-pay

## 2023-09-22 MED ORDER — MONTELUKAST SODIUM 10 MG PO TABS
10.0000 mg | ORAL_TABLET | Freq: Every day | ORAL | 0 refills | Status: AC
  Filled 2023-09-22: qty 90, 90d supply, fill #0

## 2023-09-22 MED ORDER — TRAZODONE HCL 50 MG PO TABS
50.0000 mg | ORAL_TABLET | Freq: Every day | ORAL | 0 refills | Status: AC
  Filled 2023-09-22: qty 90, 90d supply, fill #0
  Filled 2024-01-10: qty 10, 10d supply, fill #1

## 2023-09-23 ENCOUNTER — Other Ambulatory Visit: Payer: Self-pay | Admitting: Family Medicine

## 2023-09-23 ENCOUNTER — Other Ambulatory Visit: Payer: Self-pay

## 2023-09-23 MED ORDER — PANTOPRAZOLE SODIUM 40 MG PO TBEC
40.0000 mg | DELAYED_RELEASE_TABLET | Freq: Two times a day (BID) | ORAL | 0 refills | Status: AC
  Filled 2023-09-23: qty 180, 90d supply, fill #0
  Filled 2024-01-10: qty 20, 10d supply, fill #1

## 2023-09-27 ENCOUNTER — Other Ambulatory Visit: Payer: Self-pay

## 2023-10-11 DIAGNOSIS — H5213 Myopia, bilateral: Secondary | ICD-10-CM | POA: Diagnosis not present

## 2023-10-27 ENCOUNTER — Other Ambulatory Visit: Payer: Self-pay

## 2023-10-27 MED ORDER — PHENTERMINE HCL 37.5 MG PO CAPS
37.5000 mg | ORAL_CAPSULE | Freq: Every day | ORAL | 0 refills | Status: AC
  Filled 2023-10-27: qty 30, 30d supply, fill #0

## 2023-10-31 ENCOUNTER — Other Ambulatory Visit: Payer: Self-pay

## 2023-10-31 MED ORDER — ALPRAZOLAM 0.5 MG PO TABS
0.5000 mg | ORAL_TABLET | Freq: Every day | ORAL | 0 refills | Status: AC | PRN
  Filled 2023-10-31: qty 30, 30d supply, fill #0

## 2023-11-03 ENCOUNTER — Other Ambulatory Visit: Payer: Self-pay

## 2023-11-09 ENCOUNTER — Ambulatory Visit: Admitting: Cardiology

## 2023-11-11 ENCOUNTER — Other Ambulatory Visit: Payer: Self-pay

## 2023-11-11 MED ORDER — MELOXICAM 7.5 MG PO TABS
7.5000 mg | ORAL_TABLET | Freq: Every day | ORAL | 0 refills | Status: AC
  Filled 2023-11-11: qty 30, 30d supply, fill #0

## 2023-11-11 MED ORDER — METHOCARBAMOL 750 MG PO TABS
750.0000 mg | ORAL_TABLET | Freq: Every evening | ORAL | 0 refills | Status: AC
  Filled 2023-11-11: qty 30, 30d supply, fill #0

## 2023-12-06 ENCOUNTER — Other Ambulatory Visit: Payer: Self-pay

## 2023-12-09 DIAGNOSIS — Z111 Encounter for screening for respiratory tuberculosis: Secondary | ICD-10-CM | POA: Diagnosis not present

## 2023-12-22 ENCOUNTER — Ambulatory Visit: Admitting: Cardiology

## 2024-01-10 ENCOUNTER — Other Ambulatory Visit: Payer: Self-pay

## 2024-01-16 ENCOUNTER — Other Ambulatory Visit: Payer: Self-pay

## 2024-01-16 MED ORDER — METHYLPREDNISOLONE 4 MG PO TBPK
ORAL_TABLET | ORAL | 0 refills | Status: AC
  Filled 2024-01-16: qty 21, 6d supply, fill #0

## 2024-01-17 ENCOUNTER — Other Ambulatory Visit: Payer: Self-pay

## 2024-01-17 MED ORDER — TRAZODONE HCL 50 MG PO TABS
50.0000 mg | ORAL_TABLET | Freq: Every day | ORAL | 1 refills | Status: AC | PRN
  Filled 2024-01-17: qty 100, 100d supply, fill #0
  Filled 2024-01-18 – 2024-02-01 (×2): qty 90, 90d supply, fill #0

## 2024-01-18 ENCOUNTER — Other Ambulatory Visit: Payer: Self-pay

## 2024-01-31 ENCOUNTER — Other Ambulatory Visit: Payer: Self-pay

## 2024-02-01 ENCOUNTER — Other Ambulatory Visit: Payer: Self-pay

## 2024-02-03 ENCOUNTER — Encounter: Payer: Self-pay | Admitting: Cardiovascular Disease

## 2024-02-14 ENCOUNTER — Other Ambulatory Visit: Payer: Self-pay

## 2024-02-14 DIAGNOSIS — M25562 Pain in left knee: Secondary | ICD-10-CM | POA: Diagnosis not present

## 2024-02-14 MED ORDER — MELOXICAM 15 MG PO TABS
15.0000 mg | ORAL_TABLET | Freq: Every day | ORAL | 2 refills | Status: AC
  Filled 2024-02-14: qty 30, 30d supply, fill #0

## 2024-02-16 ENCOUNTER — Other Ambulatory Visit: Payer: Self-pay

## 2024-03-26 ENCOUNTER — Other Ambulatory Visit: Payer: Self-pay

## 2024-04-12 ENCOUNTER — Other Ambulatory Visit: Payer: Self-pay | Admitting: Nurse Practitioner

## 2024-04-12 DIAGNOSIS — N939 Abnormal uterine and vaginal bleeding, unspecified: Secondary | ICD-10-CM

## 2024-04-12 DIAGNOSIS — R102 Pelvic and perineal pain unspecified side: Secondary | ICD-10-CM | POA: Diagnosis not present

## 2024-04-12 DIAGNOSIS — I10 Essential (primary) hypertension: Secondary | ICD-10-CM | POA: Diagnosis not present

## 2024-04-12 DIAGNOSIS — Z01419 Encounter for gynecological examination (general) (routine) without abnormal findings: Secondary | ICD-10-CM | POA: Diagnosis not present

## 2024-04-12 DIAGNOSIS — Z1211 Encounter for screening for malignant neoplasm of colon: Secondary | ICD-10-CM | POA: Diagnosis not present

## 2024-05-02 ENCOUNTER — Other Ambulatory Visit

## 2024-05-09 ENCOUNTER — Ambulatory Visit
Admission: RE | Admit: 2024-05-09 | Discharge: 2024-05-09 | Disposition: A | Discharge status: Home / Self Care | Source: Ambulatory Visit | Attending: Nurse Practitioner | Admitting: Nurse Practitioner

## 2024-05-09 DIAGNOSIS — N939 Abnormal uterine and vaginal bleeding, unspecified: Secondary | ICD-10-CM

## 2024-05-09 DIAGNOSIS — R102 Pelvic and perineal pain unspecified side: Secondary | ICD-10-CM
